# Patient Record
Sex: Male | Born: 1975
Health system: Southern US, Community
[De-identification: ages and names within clinical notes are randomized; demographics above are authoritative.]

## PROBLEM LIST (undated history)

## (undated) ENCOUNTER — Emergency Department: Discharge status: Absent without leave | Payer: BC Managed Care – PPO

## (undated) DIAGNOSIS — E669 Obesity, unspecified: Secondary | ICD-10-CM

## (undated) DIAGNOSIS — R7303 Prediabetes: Secondary | ICD-10-CM

## (undated) DIAGNOSIS — R51 Headache: Secondary | ICD-10-CM

## (undated) DIAGNOSIS — Z8619 Personal history of other infectious and parasitic diseases: Secondary | ICD-10-CM

## (undated) DIAGNOSIS — E119 Type 2 diabetes mellitus without complications: Secondary | ICD-10-CM

## (undated) DIAGNOSIS — R519 Headache, unspecified: Secondary | ICD-10-CM

## (undated) DIAGNOSIS — Z86018 Personal history of other benign neoplasm: Secondary | ICD-10-CM

## (undated) DIAGNOSIS — I10 Essential (primary) hypertension: Secondary | ICD-10-CM

## (undated) DIAGNOSIS — T884XXA Failed or difficult intubation, initial encounter: Secondary | ICD-10-CM

## (undated) DIAGNOSIS — Z87442 Personal history of urinary calculi: Secondary | ICD-10-CM

## (undated) DIAGNOSIS — E042 Nontoxic multinodular goiter: Secondary | ICD-10-CM

## (undated) DIAGNOSIS — Z9889 Other specified postprocedural states: Secondary | ICD-10-CM

## (undated) HISTORY — DX: Other specified postprocedural states: Z98.890

## (undated) HISTORY — DX: Personal history of other infectious and parasitic diseases: Z86.19

## (undated) HISTORY — PX: HERNIA REPAIR: SHX51

## (undated) HISTORY — DX: Prediabetes: R73.03

## (undated) HISTORY — DX: Obesity, unspecified: E66.9

## (undated) HISTORY — DX: Nontoxic multinodular goiter: E04.2

## (undated) HISTORY — DX: Personal history of other benign neoplasm: Z86.018

## (undated) HISTORY — DX: Type 2 diabetes mellitus without complications: E11.9

---
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
## 2003-10-22 DIAGNOSIS — Z86018 Personal history of other benign neoplasm: Secondary | ICD-10-CM

## 2003-10-22 DIAGNOSIS — Z9889 Other specified postprocedural states: Secondary | ICD-10-CM

## 2003-10-22 HISTORY — DX: Other specified postprocedural states: Z86.018

## 2003-10-22 HISTORY — DX: Other specified postprocedural states: Z98.890

## 2003-10-22 HISTORY — PX: THYROIDECTOMY, PARTIAL: SHX18

## 2004-08-09 ENCOUNTER — Ambulatory Visit: Payer: Self-pay | Admitting: Endocrinology

## 2005-06-03 ENCOUNTER — Emergency Department: Payer: Self-pay | Admitting: Emergency Medicine

## 2006-02-17 LAB — COMPREHENSIVE METABOLIC PANEL
ALT: 49 U/L — AB (ref 10–40)
AST: 23 U/L
Albumin: 4.2
Alkaline Phosphatase: 74 U/L
BUN: 14 mg/dL (ref 4–21)
Creat: 0.9
Sodium: 140 mmol/L (ref 137–147)
Total Bilirubin: 0.3 mg/dL

## 2006-02-17 LAB — LIPID PANEL
Cholesterol, Total: 178
Direct LDL: 122
HDL: 30 mg/dL — AB (ref 35–70)

## 2006-02-17 LAB — CBC
Hemoglobin: 15.1 g/dL (ref 13.5–17.5)
WBC: 9.2

## 2006-02-17 LAB — URIC ACID: Uric Acid: 8.2

## 2006-02-17 LAB — THYROID PANEL: TSH: 2.97 u[IU]/mL (ref 0.41–5.90)

## 2011-12-26 ENCOUNTER — Encounter: Payer: Self-pay | Admitting: Family Medicine

## 2012-01-01 ENCOUNTER — Encounter: Payer: Self-pay | Admitting: Family Medicine

## 2012-01-01 ENCOUNTER — Ambulatory Visit (INDEPENDENT_AMBULATORY_CARE_PROVIDER_SITE_OTHER): Payer: 59 | Admitting: Family Medicine

## 2012-01-01 VITALS — BP 128/90 | HR 76 | Temp 98.0°F | Ht 69.5 in | Wt 272.8 lb

## 2012-01-01 DIAGNOSIS — Z23 Encounter for immunization: Secondary | ICD-10-CM

## 2012-01-01 DIAGNOSIS — R635 Abnormal weight gain: Secondary | ICD-10-CM

## 2012-01-01 DIAGNOSIS — E669 Obesity, unspecified: Secondary | ICD-10-CM

## 2012-01-01 DIAGNOSIS — Z86018 Personal history of other benign neoplasm: Secondary | ICD-10-CM | POA: Insufficient documentation

## 2012-01-01 DIAGNOSIS — Z9889 Other specified postprocedural states: Secondary | ICD-10-CM

## 2012-01-01 NOTE — Assessment & Plan Note (Addendum)
Continued R sided neck discomfort (s/p R thyroidectomy) - on exam neck normal today. With unexpected weight gain recently - start with blood work to check thyroid function. Likely recommend return to ENT for further eval although pt hesitant. Consider thyroid ultrasound vs radioisotope scan depending on results of labwork. TPO Ab normal as well as AntiThyroglobulin Ab 2007.

## 2012-01-01 NOTE — Patient Instructions (Signed)
Good to meet you today, call us with questions. Tdap today. Return tomorrow fasting for blood work - we will check thyroid then. Return as needed or in 1 year for physical.

## 2012-01-01 NOTE — Assessment & Plan Note (Signed)
Stays active in gym.  Significant weight gain recently. Body mass index is 39.70 kg/(m^2). Check TSH given h/o thyroidectomy.

## 2012-01-01 NOTE — Progress Notes (Signed)
Subjective:    Patient ID: Jorge Boyle, male    DOB: [birth year], 36 y.o.   MRN: [id number]  HPI CC: new pt, establish  H/o R lobe thyroid adenoma s/p R hemithyroidectomy 2005.  Saw Dr. Patrecia Pace 2007 with ?recurrence/scarring causing dysphagia.  rec f/u with ENT Dr. Chestine Spore, did not f/u.  Last saw Dr. Patrecia Pace 2007.  Endorsing 1 month h/o neck bothering him - endorses swelling and tender mainly on right side (side of surgery).  No fevers/chills.  No skin or hair changes.  No heat or cold intolerance.  No diarrhea/constipation.  Denies dysphagia.  Gained 30 lbs in last month.  No significant changes in eating habits or in activity (continues to go to gym).  Preventative: No physical in past. Flu - did not receive Tetanus - unsure.  Requests Tdap. Last CPE - thinks 1 yr ago.  Ate toast this morning.  Will return tomorrow fasting for blood work.  Medications and allergies reviewed and updated in chart.  Past histories reviewed and updated if relevant as below. There is no problem list on file for this patient.  Past Medical History  Diagnosis Date  . Nontoxic nodular goiter   . HLD (hyperlipidemia)   . Obesity   . Allergic rhinitis   . Hyperglycemia   . Dysphagia     after thyroidectomy - pt denies  . History of chicken pox    Past Surgical History  Procedure Date  . Thyroidectomy, partial 2005    Right and isthmus  . Tonsillectomy and adenoidectomy 1985   History  Substance Use Topics  . Smoking status: Never Smoker   . Smokeless tobacco: Never Used  . Alcohol Use: No     rare   Family History  Problem Relation Age of Onset  . Diabetes Mother   . Diabetes Father   . Stroke Maternal Grandmother   . Diabetes Maternal Grandfather   . Diabetes Paternal Grandmother   . Cancer Paternal Grandmother 79    breast  . Coronary artery disease Neg Hx   . Cancer Other     stomach, lung (smoker)   Allergies  Allergen Reactions  . Penicillins Rash   No current  outpatient prescriptions on file prior to visit.     Review of Systems  Constitutional: Positive for appetite change (increased) and unexpected weight change (incresased 30lbs in last 1 mo). Negative for fever, chills, activity change and fatigue.  HENT: Negative for hearing loss and neck pain.   Eyes: Negative for visual disturbance.  Respiratory: Negative for cough, chest tightness, shortness of breath and wheezing.   Cardiovascular: Negative for chest pain, palpitations and leg swelling.  Gastrointestinal: Negative for nausea, vomiting, abdominal pain, diarrhea, constipation, blood in stool and abdominal distention.  Genitourinary: Negative for hematuria and difficulty urinating.  Musculoskeletal: Negative for myalgias and arthralgias.  Skin: Negative for rash.  Neurological: Negative for dizziness, seizures, syncope and headaches.  Hematological: Does not bruise/bleed easily.  Psychiatric/Behavioral: Negative for dysphoric mood. The patient is not nervous/anxious.        Objective:   Physical Exam  Nursing note and vitals reviewed. Constitutional: He is oriented to person, place, and time. He appears well-developed and well-nourished. No distress.       Obese Body mass index is 39.70 kg/(m^2).  HENT:  Head: Normocephalic and atraumatic.  Right Ear: External ear normal.  Left Ear: External ear normal.  Nose: Nose normal.  Mouth/Throat: Oropharynx is clear and moist. No oropharyngeal exudate.  Eyes: Conjunctivae  and EOM are normal. Pupils are equal, round, and reactive to light. No scleral icterus.  Neck: Normal range of motion. Neck supple. No JVD present. No thyromegaly present.       No evident thyroid enlargement or obvious nodules No scarring present  Cardiovascular: Normal rate, regular rhythm, normal heart sounds and intact distal pulses.   No murmur heard. Pulses:      Radial pulses are 2+ on the right side, and 2+ on the left side.  Pulmonary/Chest: Effort normal and  breath sounds normal. No respiratory distress. He has no wheezes. He has no rales.  Abdominal: Soft. Bowel sounds are normal. He exhibits no distension and no mass. There is no tenderness. There is no rebound and no guarding.  Musculoskeletal: Normal range of motion. He exhibits no edema.  Lymphadenopathy:    He has no cervical adenopathy.  Neurological: He is alert and oriented to person, place, and time.       CN grossly intact, station and gait intact  Skin: Skin is warm and dry. No rash noted.  Psychiatric: He has a normal mood and affect. His behavior is normal. Judgment and thought content normal.      Assessment & Plan:

## 2012-01-02 ENCOUNTER — Other Ambulatory Visit (INDEPENDENT_AMBULATORY_CARE_PROVIDER_SITE_OTHER): Payer: 59

## 2012-01-02 ENCOUNTER — Other Ambulatory Visit: Payer: Self-pay | Admitting: Family Medicine

## 2012-01-02 DIAGNOSIS — E041 Nontoxic single thyroid nodule: Secondary | ICD-10-CM

## 2012-01-02 DIAGNOSIS — E669 Obesity, unspecified: Secondary | ICD-10-CM

## 2012-01-02 DIAGNOSIS — Z9889 Other specified postprocedural states: Secondary | ICD-10-CM

## 2012-01-02 DIAGNOSIS — Z86018 Personal history of other benign neoplasm: Secondary | ICD-10-CM

## 2012-01-02 LAB — CBC WITH DIFFERENTIAL/PLATELET
Basophils Relative: 0.3 % (ref 0.0–3.0)
Eosinophils Absolute: 0.2 10*3/uL (ref 0.0–0.7)
Eosinophils Relative: 1.9 % (ref 0.0–5.0)
HCT: 42.8 % (ref 39.0–52.0)
Hemoglobin: 14.4 g/dL (ref 13.0–17.0)
Lymphs Abs: 2.6 10*3/uL (ref 0.7–4.0)
MCHC: 33.7 g/dL (ref 30.0–36.0)
MCV: 91.7 fl (ref 78.0–100.0)
Monocytes Absolute: 0.6 10*3/uL (ref 0.1–1.0)
Neutro Abs: 6.6 10*3/uL (ref 1.4–7.7)
RBC: 4.67 Mil/uL (ref 4.22–5.81)

## 2012-01-02 LAB — COMPREHENSIVE METABOLIC PANEL
ALT: 37 U/L (ref 0–53)
AST: 25 U/L (ref 0–37)
Alkaline Phosphatase: 53 U/L (ref 39–117)
Calcium: 8.8 mg/dL (ref 8.4–10.5)
Chloride: 103 mEq/L (ref 96–112)
Creatinine, Ser: 0.9 mg/dL (ref 0.4–1.5)
Total Bilirubin: 0.4 mg/dL (ref 0.3–1.2)

## 2012-01-02 LAB — LIPID PANEL
LDL Cholesterol: 131 mg/dL — ABNORMAL HIGH (ref 0–99)
Total CHOL/HDL Ratio: 5
Triglycerides: 50 mg/dL (ref 0.0–149.0)
VLDL: 10 mg/dL (ref 0.0–40.0)

## 2012-01-09 ENCOUNTER — Ambulatory Visit
Admission: RE | Admit: 2012-01-09 | Discharge: 2012-01-09 | Disposition: A | Discharge status: Home / Self Care | Payer: 59 | Source: Ambulatory Visit | Attending: Family Medicine | Admitting: Family Medicine

## 2012-01-09 DIAGNOSIS — E041 Nontoxic single thyroid nodule: Secondary | ICD-10-CM

## 2012-01-13 ENCOUNTER — Other Ambulatory Visit: Payer: Self-pay | Admitting: Family Medicine

## 2012-01-13 DIAGNOSIS — E041 Nontoxic single thyroid nodule: Secondary | ICD-10-CM

## 2012-01-14 ENCOUNTER — Other Ambulatory Visit: Payer: Self-pay | Admitting: Family Medicine

## 2012-01-14 DIAGNOSIS — E041 Nontoxic single thyroid nodule: Secondary | ICD-10-CM | POA: Insufficient documentation

## 2012-01-20 ENCOUNTER — Telehealth: Payer: Self-pay | Admitting: Family Medicine

## 2012-01-20 DIAGNOSIS — E042 Nontoxic multinodular goiter: Secondary | ICD-10-CM

## 2012-01-20 HISTORY — DX: Nontoxic multinodular goiter: E04.2

## 2012-01-20 NOTE — Telephone Encounter (Signed)
Called patient after mailing him the $ of the fine Needle Thyroid Biopsy. The letter was returned here as undeliverable  even though it was the correct address. Gave patient the phone number tp [facility] billing dept so he can call to see what they will accept from him as he is already making a monthly payment and the Biopsy cost about $2200.00. Says he will call me back after talking to them of whether or not he can afford to have this done now or not.

## 2012-01-20 NOTE — Telephone Encounter (Signed)
Noted. Thanks.

## 2012-02-18 ENCOUNTER — Other Ambulatory Visit (HOSPITAL_COMMUNITY)
Admission: RE | Admit: 2012-02-18 | Discharge: 2012-02-18 | Disposition: A | Discharge status: Home / Self Care | Payer: 59 | Source: Ambulatory Visit | Attending: Interventional Radiology | Admitting: Interventional Radiology

## 2012-02-18 ENCOUNTER — Ambulatory Visit
Admission: RE | Admit: 2012-02-18 | Discharge: 2012-02-18 | Disposition: A | Discharge status: Home / Self Care | Payer: 59 | Source: Ambulatory Visit | Attending: Family Medicine | Admitting: Family Medicine

## 2012-02-18 DIAGNOSIS — E049 Nontoxic goiter, unspecified: Secondary | ICD-10-CM | POA: Insufficient documentation

## 2012-02-18 DIAGNOSIS — E041 Nontoxic single thyroid nodule: Secondary | ICD-10-CM

## 2012-02-19 ENCOUNTER — Encounter: Payer: Self-pay | Admitting: Family Medicine

## 2012-07-30 ENCOUNTER — Encounter: Payer: Self-pay | Admitting: Family Medicine

## 2012-07-30 ENCOUNTER — Telehealth: Payer: Self-pay | Admitting: Family Medicine

## 2012-07-30 DIAGNOSIS — R7303 Prediabetes: Secondary | ICD-10-CM

## 2012-07-30 DIAGNOSIS — E1140 Type 2 diabetes mellitus with diabetic neuropathy, unspecified: Secondary | ICD-10-CM | POA: Insufficient documentation

## 2012-07-30 NOTE — Telephone Encounter (Signed)
plz notify I received blood work from [facility] - god chol levels, blood pressure.  A1c showing prediabetes which along with fam hx puts him at increased risk of developing diabetes.  Work on minimizing added sugars and weight loss. plz input into chart.  placed in kim's chart

## 2012-07-31 NOTE — Telephone Encounter (Signed)
Labs entered and sent for scanning.

## 2012-07-31 NOTE — Telephone Encounter (Signed)
Message left notifying patient.

## 2013-01-29 ENCOUNTER — Encounter: Payer: Self-pay | Admitting: Family Medicine

## 2013-01-29 ENCOUNTER — Ambulatory Visit (INDEPENDENT_AMBULATORY_CARE_PROVIDER_SITE_OTHER): Payer: 59 | Admitting: Family Medicine

## 2013-01-29 VITALS — BP 122/82 | HR 86 | Temp 98.1°F | Wt 267.8 lb

## 2013-01-29 DIAGNOSIS — J3489 Other specified disorders of nose and nasal sinuses: Secondary | ICD-10-CM

## 2013-01-29 DIAGNOSIS — R7309 Other abnormal glucose: Secondary | ICD-10-CM

## 2013-01-29 DIAGNOSIS — E669 Obesity, unspecified: Secondary | ICD-10-CM

## 2013-01-29 DIAGNOSIS — R7303 Prediabetes: Secondary | ICD-10-CM

## 2013-01-29 DIAGNOSIS — R42 Dizziness and giddiness: Secondary | ICD-10-CM

## 2013-01-29 DIAGNOSIS — E041 Nontoxic single thyroid nodule: Secondary | ICD-10-CM

## 2013-01-29 DIAGNOSIS — R0981 Nasal congestion: Secondary | ICD-10-CM | POA: Insufficient documentation

## 2013-01-29 LAB — CBC WITH DIFFERENTIAL/PLATELET
Basophils Relative: 0.4 % (ref 0.0–3.0)
Eosinophils Relative: 0.8 % (ref 0.0–5.0)
HCT: 43.3 % (ref 39.0–52.0)
Lymphs Abs: 1.7 10*3/uL (ref 0.7–4.0)
MCV: 89.7 fl (ref 78.0–100.0)
Monocytes Absolute: 0.6 10*3/uL (ref 0.1–1.0)
Monocytes Relative: 6.8 % (ref 3.0–12.0)
Neutrophils Relative %: 72.3 % (ref 43.0–77.0)
RBC: 4.83 Mil/uL (ref 4.22–5.81)
WBC: 8.8 10*3/uL (ref 4.5–10.5)

## 2013-01-29 LAB — BASIC METABOLIC PANEL
CO2: 20 mEq/L (ref 19–32)
Glucose, Bld: 107 mg/dL — ABNORMAL HIGH (ref 70–99)
Potassium: 3.9 mEq/L (ref 3.5–5.1)
Sodium: 137 mEq/L (ref 135–145)

## 2013-01-29 LAB — T3: T3, Total: 70.8 ng/dL — ABNORMAL LOW (ref 80.0–204.0)

## 2013-01-29 MED ORDER — FLUTICASONE PROPIONATE 50 MCG/ACT NA SUSP: 2.0000 | Freq: Every day | NASAL | Status: DC

## 2013-01-29 NOTE — Assessment & Plan Note (Signed)
Doubt infectious, rather allergic. See pt instructions for plan. Update if sxs persist or worsen, to consider abx course.

## 2013-01-29 NOTE — Assessment & Plan Note (Signed)
Recheck A1c today 

## 2013-01-29 NOTE — Assessment & Plan Note (Signed)
Check blood work today. Due for recheck thyroid US - will schedule.

## 2013-01-29 NOTE — Assessment & Plan Note (Signed)
Encouraged continued exercise for weight loss.

## 2013-01-29 NOTE — Patient Instructions (Addendum)
Blood work today. Pass by Marion's office for referral for thyroid ultrasound to monitor thyroid nodules. I think you do have significant sinus congestion, but I don't think there's infection. Treat with starting nasal saline irrigation, nasal steroid (sent to pharmacy), and plain mucinex or immediate release guaifenesin with plenty of water to mobilize mucous out. Watch for fever, worsening headaches or if not improving with above, let me know. Good to see you today, call us with questions. Pass by [facility]'s office for thyroid ultrasound.

## 2013-01-29 NOTE — Progress Notes (Signed)
  Subjective:    Patient ID: Jorge Boyle, male    DOB: 10/16/1976, 37 y.o.   MRN: [id number]  HPI CC: lightheadedness  1.5 wk h/o sinus congestion (intermittent long term, recently worsening).  Also notices lightheadedness worse over last week.  Wonders about sugars.  Seems to improve after drinking soda.  + posterior skull headache daily over last week.  Some sinus and facial pressure/pain.  Rhinorrhea.  Blowing nose with yellow mucous.  No fevers/chills, abd pain, nausea/vomiting, diarrhea, ear or tooth pain, PNdrainage, sore throat.  Denies chest pain, wheezing, dyspnea.  No increased frequency.  No cough. Could do better with hydration status.  H/o allergic rhinitis (pt denies).  No h/o asthma. No sick contacts at home. No smokers at home.  Checked sugar at mother's house when wasn't feeling well, cbg was 70.  Umbilical hernia for last 2 months.  Not tender.  Started after picked up motor at [place].  H/o R lobe thyroid adenoma s/p R hemithyroidectomy 2005. Saw Dr. Patrecia Pace 2007 with ?recurrence/scarring causing dysphagia. rec f/u with ENT Dr. Chestine Spore, did not f/u. Last saw Dr. Patrecia Pace 2007. Had thyroid ultrasound showing mult nodules 12/2011.  S/p benign thyroid biopsy 12/2011. Due for blood work.  Wt Readings from Last 3 Encounters:  01/29/13 267 lb 12 oz (121.451 kg)  01/01/12 272 lb 12 oz (123.719 kg)    Past Medical History  Diagnosis Date  . Nontoxic nodular goiter   . Obesity   . Allergic rhinitis   . Prediabetes   . Dysphagia     after thyroidectomy - pt did not f/u with ENT  . History of chicken pox   . S/P excision of thyroid adenoma 2005    R side  . Multiple thyroid nodules 01/2012    left, biopsy x2 benign, rec rpt [place] 1 yr     Review of Systems Per HPI    Objective:   Physical Exam  Nursing note and vitals reviewed. Constitutional: He appears well-developed and well-nourished. No distress.  HENT:  Head: Normocephalic and atraumatic.  Right Ear:  Hearing, tympanic membrane, external ear and ear canal normal.  Left Ear: Hearing, tympanic membrane, external ear and ear canal normal.  Nose: Mucosal edema present. No rhinorrhea. Right sinus exhibits no maxillary sinus tenderness and no frontal sinus tenderness. Left sinus exhibits no maxillary sinus tenderness and no frontal sinus tenderness.  Mouth/Throat: Uvula is midline, oropharynx is clear and moist and mucous membranes are normal. No oropharyngeal exudate, posterior oropharyngeal edema, posterior oropharyngeal erythema or tonsillar abscesses.  Swollen nasal turbinates, discharge in nostrils  Eyes: Conjunctivae and EOM are normal. Pupils are equal, round, and reactive to light. No scleral icterus.  Neck: Normal range of motion. Neck supple. No thyromegaly present.  Cardiovascular: Normal rate, regular rhythm, normal heart sounds and intact distal pulses.   No murmur heard. Pulmonary/Chest: Effort normal and breath sounds normal. No respiratory distress. He has no wheezes. He has no rales.  Lymphadenopathy:    He has no cervical adenopathy.  Skin: Skin is warm and dry. No rash noted.       Assessment & Plan:

## 2013-02-01 ENCOUNTER — Encounter: Payer: Self-pay | Admitting: *Deleted

## 2013-02-05 ENCOUNTER — Ambulatory Visit
Admission: RE | Admit: 2013-02-05 | Discharge: 2013-02-05 | Disposition: A | Discharge status: Home / Self Care | Payer: 59 | Source: Ambulatory Visit | Attending: Family Medicine | Admitting: Family Medicine

## 2013-02-05 ENCOUNTER — Encounter: Payer: Self-pay | Admitting: Family Medicine

## 2013-02-05 DIAGNOSIS — E041 Nontoxic single thyroid nodule: Secondary | ICD-10-CM

## 2013-02-08 ENCOUNTER — Encounter: Payer: Self-pay | Admitting: *Deleted

## 2013-05-21 LAB — LIPID PANEL
Direct LDL: 126
HDL: 35 mg/dL (ref 35–70)
Triglycerides: 119

## 2013-05-21 LAB — HEMOGLOBIN A1C: A1c: 6.5

## 2013-05-31 ENCOUNTER — Ambulatory Visit: Payer: 59 | Admitting: Family Medicine

## 2013-06-02 ENCOUNTER — Ambulatory Visit (INDEPENDENT_AMBULATORY_CARE_PROVIDER_SITE_OTHER): Payer: 59 | Admitting: Family Medicine

## 2013-06-02 ENCOUNTER — Encounter: Payer: Self-pay | Admitting: *Deleted

## 2013-06-02 ENCOUNTER — Encounter: Payer: Self-pay | Admitting: Family Medicine

## 2013-06-02 VITALS — BP 118/78 | HR 68 | Temp 98.0°F | Wt 275.5 lb

## 2013-06-02 DIAGNOSIS — K429 Umbilical hernia without obstruction or gangrene: Secondary | ICD-10-CM

## 2013-06-02 NOTE — Patient Instructions (Signed)
you have umbilical hernia.  Best thing to do is work on weight.  Increase cardio. Pass by [facility]'s office for referral to surgeon to discuss.  Hernia A hernia occurs when an internal organ pushes out through a weak spot in the abdominal wall. Hernias most commonly occur in the groin and around the navel. Hernias often can be pushed back into place (reduced). Most hernias tend to get worse over time. Some abdominal hernias can get stuck in the opening (irreducible or incarcerated hernia) and cannot be reduced. An irreducible abdominal hernia which is tightly squeezed into the opening is at risk for impaired blood supply (strangulated hernia). A strangulated hernia is a medical emergency. Because of the risk for an irreducible or strangulated hernia, surgery may be recommended to repair a hernia. CAUSES   Heavy lifting.  Prolonged coughing.  Straining to have a bowel movement.  A cut (incision) made during an abdominal surgery. HOME CARE INSTRUCTIONS   Bed rest is not required. You may continue your normal activities.  Avoid lifting more than 10 pounds (4.5 kg) or straining.  Cough gently. If you are a smoker it is best to stop. Even the best hernia repair can break down with the continual strain of coughing. Even if you do not have your hernia repaired, a cough will continue to aggravate the problem.  Do not wear anything tight over your hernia. Do not try to keep it in with an outside bandage or truss. These can damage abdominal contents if they are trapped within the hernia sac.  Eat a normal diet.  Avoid constipation. Straining over long periods of time will increase hernia size and encourage breakdown of repairs. If you cannot do this with diet alone, stool softeners may be used. SEEK IMMEDIATE MEDICAL CARE IF:   You have a fever.  You develop increasing abdominal pain.  You feel nauseous or vomit.  Your hernia is stuck outside the abdomen, looks discolored, feels hard, or is  tender.  You have any changes in your bowel habits or in the hernia that are unusual for you.  You have increased pain or swelling around the hernia.  You cannot push the hernia back in place by applying gentle pressure while lying down. MAKE SURE YOU:   Understand these instructions.  Will watch your condition.  Will get help right away if you are not doing well or get worse. Document Released: 10/07/2005 Document Revised: 12/30/2011 Document Reviewed: 05/26/2008 [facility] Patient Information 2014 [place], [place].

## 2013-06-02 NOTE — Progress Notes (Signed)
  Subjective:    Patient ID: Jorge Boyle, male    DOB: February 17, 1976, 36 y.o.   MRN: [id number]  HPI CC: abd discomfort  Lower abd discomfort - noted for last 2 weeks.  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  Last 2 days, abd discomfort has improved.  No fevers/chills, nausea/vomiting, diarrhea, constipation.  Pain not food related.  No gassiness or bloating or indigestion.  No h/o abd surgeries in past.  Body mass index is 40.11 kg/(m^2). Wt Readings from Last 3 Encounters:  06/02/13 275 lb 8 oz (124.966 kg)  01/29/13 267 lb 12 oz (121.451 kg)  01/01/12 272 lb 12 oz (123.719 kg)   Past Medical History  Diagnosis Date  . Nontoxic nodular goiter   . Obesity   . Allergic rhinitis   . Prediabetes   . History of chicken pox   . S/P excision of thyroid adenoma 2005    R side  . Multiple thyroid nodules 01/2012    left, biopsy x2 benign, rpt [place] stable (01/2013), consider rpt 1-2 yrs    Past Surgical History  Procedure Laterality Date  . Thyroidectomy, partial  2005    Right and isthmus  . Tonsillectomy and adenoidectomy  1985   Review of Systems Per HPI    Objective:   Physical Exam  Nursing note and vitals reviewed. Constitutional: He appears well-developed and well-nourished. No distress.  Abdominal: Soft. Normal appearance and bowel sounds are normal. He exhibits no distension and no mass. There is no tenderness. There is no rebound and no guarding. A hernia is present.  Small umbilical hernia present, reducible and nontender. About 3 cm diameter defect in abd wall       Assessment & Plan:

## 2013-06-04 ENCOUNTER — Emergency Department: Payer: Self-pay | Admitting: Emergency Medicine

## 2013-06-23 ENCOUNTER — Ambulatory Visit: Payer: Self-pay | Admitting: General Surgery

## 2013-06-30 ENCOUNTER — Encounter: Payer: Self-pay | Admitting: General Surgery

## 2013-06-30 ENCOUNTER — Ambulatory Visit (INDEPENDENT_AMBULATORY_CARE_PROVIDER_SITE_OTHER): Payer: 59 | Admitting: General Surgery

## 2013-06-30 VITALS — BP 164/82 | HR 80 | Resp 14 | Ht 70.0 in | Wt 274.0 lb

## 2013-06-30 DIAGNOSIS — K429 Umbilical hernia without obstruction or gangrene: Secondary | ICD-10-CM

## 2013-06-30 NOTE — Progress Notes (Signed)
Patient ID: Jorge Boyle, male   DOB: 1975/11/15, 37 y.o.   MRN: [id number]  Chief Complaint  Patient presents with  . Other    evaluation of an umbilical hernia    HPI Jorge Boyle is a 37 y.o. male here today for an evaluation of an umbilical hernia. The patient states he noticed the hernia approximately 2 months ago. He states he has abdominal pain with lifting. No other complaints at this time.  The patient is accompanied today by his father who was present for the interview and exam. The father reports that he has checked his son sugars on several occasions and found values in the 300s. Recently a fasting morning blood sugar was over 200.  The abdominal pain reported is discomfort at the umbilical area when the patient bends at the waist. No nausea, vomiting or other GI distress. No change in bowel habits. No unusual physical activity recall prior to the onset of the hernia  HPI  Past Medical History  Diagnosis Date  . Nontoxic nodular goiter   . Obesity   . Allergic rhinitis   . Prediabetes   . History of chicken pox   . S/P excision of thyroid adenoma 2005    R side  . Multiple thyroid nodules 01/2012    left, biopsy x2 benign, rpt [place] stable (01/2013), consider rpt 1-2 yrs    Past Surgical History  Procedure Laterality Date  . Thyroidectomy, partial  2005    Right and isthmus  . Tonsillectomy and adenoidectomy  1985    Family History  Problem Relation Age of Onset  . Diabetes Mother   . Diabetes Father   . Stroke Maternal Grandmother   . Diabetes Maternal Grandfather   . Diabetes Paternal Grandmother   . Cancer Paternal Grandmother 28    breast  . Coronary artery disease Neg Hx   . Cancer Other     stomach, lung (smoker)    Social History History  Substance Use Topics  . Smoking status: Never Smoker   . Smokeless tobacco: Never Used  . Alcohol Use: Yes     Comment: rare    Allergies  Allergen Reactions  . Penicillins Rash    Current Outpatient  Prescriptions  Medication Sig Dispense Refill  . fluticasone (FLONASE) 50 MCG/ACT nasal spray Place 2 sprays into the nose daily.  16 g  3   No current facility-administered medications for this visit.    Review of Systems Review of Systems  Constitutional: Negative.   Respiratory: Negative.   Cardiovascular: Negative.   Gastrointestinal: Positive for abdominal pain.   GU: Polyuria.  Blood pressure 164/82, pulse 80, resp. rate 14, height 5\' 10"  (1.778 m), weight 274 lb (124.286 kg).  Physical Exam Physical Exam  Constitutional: He is oriented to person, place, and time. He appears well-developed and well-nourished.  Cardiovascular: Normal rate, regular rhythm and normal heart sounds.   Pulmonary/Chest: Breath sounds normal.  Abdominal: Normal appearance and bowel sounds are normal. Hernia: umbilical hernia.  The umbilical hernia is perhaps 3 cm in maximum diameter. Nonreducible fat. No air edema. Minimal local tenderness. No inguinal hernias are identified.  Lymphadenopathy:    He has no cervical adenopathy.  Neurological: He is alert and oriented to person, place, and time.  Skin: Skin is warm and dry.    Data Reviewed 01/29/2013 hemoglobin A1c determination: 6.0.  Assessment    Umbilical hernia with nonreducible fat.  History of elevated blood sugar and polyuria.    Plan  The patient has been encouraged to followup with his primary care physician for reassessment of his glucose control. Elective surgery should be deferred until blood sugars are regularly below 200, ideally below 150. Nothing on clinical exam prompt surgical intervention with his present glucose history.       Earline Mayotte 06/30/2013, 4:14 PM

## 2013-07-06 ENCOUNTER — Ambulatory Visit (INDEPENDENT_AMBULATORY_CARE_PROVIDER_SITE_OTHER): Payer: 59 | Admitting: Family Medicine

## 2013-07-06 ENCOUNTER — Encounter: Payer: Self-pay | Admitting: Family Medicine

## 2013-07-06 VITALS — BP 128/88 | HR 84 | Temp 97.4°F | Wt 272.0 lb

## 2013-07-06 DIAGNOSIS — R7303 Prediabetes: Secondary | ICD-10-CM

## 2013-07-06 DIAGNOSIS — E669 Obesity, unspecified: Secondary | ICD-10-CM

## 2013-07-06 DIAGNOSIS — Z23 Encounter for immunization: Secondary | ICD-10-CM

## 2013-07-06 DIAGNOSIS — R7309 Other abnormal glucose: Secondary | ICD-10-CM

## 2013-07-06 DIAGNOSIS — R030 Elevated blood-pressure reading, without diagnosis of hypertension: Secondary | ICD-10-CM

## 2013-07-06 DIAGNOSIS — IMO0001 Reserved for inherently not codable concepts without codable children: Secondary | ICD-10-CM

## 2013-07-06 MED ORDER — METFORMIN HCL 500 MG PO TABS: 500.0000 mg | ORAL_TABLET | Freq: Every day | ORAL | Status: DC

## 2013-07-06 NOTE — Patient Instructions (Addendum)
Flu shot today. Let's keep track of blood pressure once a week or once every 2 weeks at parent's house or local pharmacy.  Goal blood pressure <140/90.  If consistently elevated, let me know. Avoid simple carbs (white starches.  When you eat carbs, eat complex carbs.  Increase vegetables and some fruits.  Avoid sweetened beverages. Let's start metformin 500mg  once daily at night time with dinner for better sugar control.

## 2013-07-06 NOTE — Assessment & Plan Note (Signed)
Wt Readings from Last 3 Encounters:  07/06/13 272 lb (123.378 kg)  06/30/13 274 lb (124.286 kg)  06/02/13 275 lb 8 oz (124.966 kg)  weight slowly dropping. Encouraged continued weight loss.

## 2013-07-06 NOTE — Addendum Note (Signed)
Addended by: Janalyn Harder on: 07/06/2013 10:51 AM   Modules accepted: Orders

## 2013-07-06 NOTE — Assessment & Plan Note (Signed)
Recommended mediterranean diet. Pt will monitor at home ,and notify me if persistently elevated.

## 2013-07-06 NOTE — Assessment & Plan Note (Addendum)
Known prediabetes, latest A1c crept up to 6.5%.   Start metformin.   Discussed dietary changes including avoiding simple carbs.

## 2013-07-06 NOTE — Progress Notes (Signed)
  Subjective:    Patient ID: Jorge Boyle, male    DOB: [birth year], 37 y.o.   MRN: [id number]  HPI CC: discuss concerns  Recent eval by surgery - endorsed high sugars to 300, however on checks here over the last year never >150.  Over the last month, endorsing polyuria, nocturia several times a night.  Checked sugar once this week fasting - 210.  Today checked sugar fasting 112.  No polyphagia, polydipsia. Lab Results  Component Value Date   HGBA1C 6.0 01/29/2013   Known h/o prediabetes.  However last a1c at work was 6.5% - diabetes range.  Also found to have elevated BP reading to 164/82, first elevated reading.   BP Readings from Last 3 Encounters:  07/06/13 128/88  06/30/13 164/82  06/02/13 118/78   Lives with brother - doesn't like to buy food because brother will eat it.  Past Medical History  Diagnosis Date  . Nontoxic nodular goiter   . Obesity   . Allergic rhinitis   . Prediabetes   . History of chicken pox   . S/P excision of thyroid adenoma 2005    R side  . Multiple thyroid nodules 01/2012    left, biopsy x2 benign, rpt [place] stable (01/2013), consider rpt 1-2 yrs     Review of Systems Per HPI    Objective:   Physical Exam  Nursing note and vitals reviewed. Constitutional: He appears well-developed and well-nourished. No distress.  HENT:  Mouth/Throat: Oropharynx is clear and moist. No oropharyngeal exudate.  Cardiovascular: Normal rate, regular rhythm, normal heart sounds and intact distal pulses.   No murmur heard. Pulmonary/Chest: Effort normal and breath sounds normal. No respiratory distress. He has no wheezes. He has no rales.  Musculoskeletal: He exhibits no edema.  Skin: Skin is warm and dry.  Psychiatric: He has a normal mood and affect.       Assessment & Plan:

## 2013-07-07 ENCOUNTER — Encounter: Payer: Self-pay | Admitting: *Deleted

## 2013-08-05 ENCOUNTER — Encounter: Payer: Self-pay | Admitting: Family Medicine

## 2013-11-22 ENCOUNTER — Encounter: Payer: Self-pay | Admitting: Family Medicine

## 2013-11-22 ENCOUNTER — Ambulatory Visit (INDEPENDENT_AMBULATORY_CARE_PROVIDER_SITE_OTHER): Payer: 59 | Admitting: Family Medicine

## 2013-11-22 VITALS — BP 128/88 | HR 72 | Temp 98.2°F | Wt 278.5 lb

## 2013-11-22 DIAGNOSIS — R7303 Prediabetes: Secondary | ICD-10-CM

## 2013-11-22 DIAGNOSIS — E669 Obesity, unspecified: Secondary | ICD-10-CM

## 2013-11-22 DIAGNOSIS — R358 Other polyuria: Secondary | ICD-10-CM

## 2013-11-22 DIAGNOSIS — K429 Umbilical hernia without obstruction or gangrene: Secondary | ICD-10-CM

## 2013-11-22 DIAGNOSIS — R3589 Other polyuria: Secondary | ICD-10-CM

## 2013-11-22 DIAGNOSIS — R7309 Other abnormal glucose: Secondary | ICD-10-CM

## 2013-11-22 DIAGNOSIS — E041 Nontoxic single thyroid nodule: Secondary | ICD-10-CM

## 2013-11-22 DIAGNOSIS — E119 Type 2 diabetes mellitus without complications: Secondary | ICD-10-CM

## 2013-11-22 DIAGNOSIS — K439 Ventral hernia without obstruction or gangrene: Secondary | ICD-10-CM

## 2013-11-22 LAB — POCT URINALYSIS DIPSTICK
Bilirubin, UA: NEGATIVE
GLUCOSE UA: NEGATIVE
Ketones, UA: NEGATIVE
Leukocytes, UA: NEGATIVE
NITRITE UA: NEGATIVE
Protein, UA: NEGATIVE
SPEC GRAV UA: 1.015
UROBILINOGEN UA: 0.2
pH, UA: 7

## 2013-11-22 LAB — GLUCOSE, POCT (MANUAL RESULT ENTRY): POC GLUCOSE: 104 mg/dL — AB (ref 70–99)

## 2013-11-22 MED ORDER — ONETOUCH ULTRA SYSTEM W/DEVICE KIT: PACK | Status: DC

## 2013-11-22 MED ORDER — GLUCOSE BLOOD VI STRP
ORAL_STRIP | Status: DC
Start: 2013-11-22 — End: 2015-12-23

## 2013-11-22 NOTE — Assessment & Plan Note (Addendum)
Consider rpt [place] 01/2014 Will recheck thyroid function panel next month at f/u visit.

## 2013-11-22 NOTE — Patient Instructions (Signed)
You have persistent umbilical hernia, but also have a ventral hernia - this should not cause any problems. Weight loss will be the best treatment for both these hernias.  Try to incorporate gym into routine.  Try to prepare meals to take to work instead of eating out. Sugar check today. I've sent in glucose meter for you to have at home and check as needed. Let me know if you'd like referral back to general surgery.  Ventral Hernia A ventral hernia (also called an incisional hernia) is a hernia that occurs at the site of a previous surgical cut (incision) in the abdomen or midline in the abdomen. The abdominal wall spans from your lower chest down to your pelvis. If the abdominal wall is weakened from a surgical incision, a hernia can occur. A hernia is a bulge of bowel or muscle tissue pushing out on the weakened part of the abdominal wall. Ventral hernias can get bigger from straining or lifting. Obese and older people are at higher risk for a ventral hernia. People who develop infections after surgery or require repeat incisions at the same site on the abdomen are also at increased risk. CAUSES  A ventral hernia occurs because of weakness in the abdominal wall at an incision site.  SYMPTOMS  Common symptoms include:  A visible bulge or lump on the abdominal wall.  Pain or tenderness around the lump.  Increased discomfort if you cough or make a sudden movement. If the hernia has blocked part of the intestine, a serious complication can occur (incarcerated or strangulated hernia). This can become a problem that requires emergency surgery because the blood flow to the blocked intestine may be cut off. Symptoms may include:  Feeling sick to your stomach (nauseous).  Throwing up (vomiting).  Stomach swelling (distention) or bloating.  Fever.  Rapid heartbeat. DIAGNOSIS  Your caregiver will take a medical history and perform a physical exam. Various tests may be ordered, such as:  Blood  tests.  Urine tests.  Ultrasonography.  X-rays.  Computed tomography (CT). TREATMENT  Watchful waiting may be all that is needed for a smaller hernia that does not cause symptoms. Your caregiver may recommend the use of a supportive belt (truss) that helps to keep the abdominal wall intact. For larger hernias or those that cause pain, surgery to repair the hernia is usually recommended. If a hernia becomes strangulated, emergency surgery needs to be done right away. HOME CARE INSTRUCTIONS  Avoid putting pressure or strain on the abdominal area.  Avoid heavy lifting.  Use good body positioning for physical tasks. Ask your caregiver about proper body positioning.  Use a supportive belt as directed by your caregiver.  Maintain a healthy weight.  Eat foods that are high in fiber, such as whole grains, fruits, and vegetables. Fiber helps prevent difficult bowel movements (constipation).  Drink enough fluids to keep your urine clear or pale yellow.  Follow up with your caregiver as directed. SEEK MEDICAL CARE IF:   Your hernia seems to be getting larger or more painful. SEEK IMMEDIATE MEDICAL CARE IF:   You have abdominal pain that is sudden and sharp.  Your pain becomes severe.  You have repeated vomiting.  You are sweating a lot.  You notice a rapid heartbeat.  You develop a fever. MAKE SURE YOU:   Understand these instructions.  Will watch your condition.  Will get help right away if you are not doing well or get worse. Document Released: 09/23/2012 Document Reviewed: 09/23/2012  [employer] Patient Information 2014 [employer].  

## 2013-11-22 NOTE — Assessment & Plan Note (Addendum)
Overall stable. Offered referral back to surgery as pt remains worried about this.

## 2013-11-22 NOTE — Assessment & Plan Note (Signed)
Small. Doubt will cause a problem.

## 2013-11-22 NOTE — Assessment & Plan Note (Addendum)
H/o one elevated A1c to 6.5%, since then all well controlled on metformin and <6.5. Continue med. Script for glucose meter provided today. cbg today 104.

## 2013-11-22 NOTE — Assessment & Plan Note (Signed)
Reviewed importance of weight loss to help control hernias present. Discussed preparing meals to take to work, discussed healthy options for convenience food, discussed incorporating exercise into routine. Wt Readings from Last 3 Encounters:  11/22/13 278 lb 8 oz (126.327 kg)  07/06/13 272 lb (123.378 kg)  06/30/13 274 lb (124.286 kg)  Body mass index is 39.96 kg/(m^2).

## 2013-11-22 NOTE — Assessment & Plan Note (Signed)
No cause found on UA/micro today.

## 2013-11-22 NOTE — Progress Notes (Signed)
   Subjective:    Patient ID: Jorge Boyle, male    DOB: [birth year], 38 y.o.   MRN: [id number]  HPI CC: recheck hernia  Known umbilical hernia evaluated by Dr. Fleet Contras but at that time his sugars had been running high to 200-300.  A1c always normal here however.  Now over last week noticing swelling superior to prior umbilical hernia.  Persistent discomfort at umbilicus.  Always reducible.  Obesity - Body mass index is 39.96 kg/(m^2). Wt Readings from Last 3 Encounters:  11/22/13 278 lb 8 oz (126.327 kg)  07/06/13 272 lb (123.378 kg)  06/30/13 274 lb (124.286 kg)  works long hours, second shift, hasn't made time to incorporate exercise into routine. When he goes to gym, finds he is constantly voiding.  Also endorses increased urination during the day at work.  h/o mild diabetes - has been controlled with am metformin 500mg  with breakfast. Lab Results  Component Value Date   HGBA1C 6.0 01/29/2013   H/o thyroid nodule on left s/p R partial thyroidectomy. Lab Results  Component Value Date   TSH 2.95 01/29/2013   Past Medical History  Diagnosis Date  . Nontoxic nodular goiter   . Obesity   . Allergic rhinitis   . Prediabetes   . History of chicken pox   . S/P excision of thyroid adenoma 2005    R side  . Multiple thyroid nodules 01/2012    left, biopsy x2 benign, rpt [place] stable (01/2013), consider rpt 1-2 yrs    Past Surgical History  Procedure Laterality Date  . Thyroidectomy, partial  2005    Right and isthmus  . Tonsillectomy and adenoidectomy  1985    Review of Systems Per HPI    Objective:   Physical Exam  Nursing note and vitals reviewed. Constitutional: He appears well-developed and well-nourished. No distress.  HENT:  Mouth/Throat: Oropharynx is clear and moist. No oropharyngeal exudate.  Neck: No thyromegaly present.  Cardiovascular: Normal rate, regular rhythm, normal heart sounds and intact distal pulses.   No murmur heard. Pulmonary/Chest: Effort normal  and breath sounds normal. No respiratory distress. He has no wheezes. He has no rales.  Abdominal: Soft. Bowel sounds are normal. He exhibits no distension and no mass. There is no hepatosplenomegaly. There is no tenderness. There is no rigidity, no rebound, no guarding and negative Murphy's sign. A hernia is present. Hernia confirmed positive in the ventral area.  Obese Ventral and umbilical fat containing hernia present.  Reducible, slightly tender to palpation.  Musculoskeletal: He exhibits no edema.  Skin: Skin is warm and dry. No rash noted.  Psychiatric: He has a normal mood and affect.       Assessment & Plan:

## 2013-11-22 NOTE — Progress Notes (Signed)
Pre-visit discussion using our clinic review tool. No additional management support is needed unless otherwise documented below in the visit note.  

## 2013-11-25 ENCOUNTER — Telehealth: Payer: Self-pay

## 2013-11-25 NOTE — Telephone Encounter (Signed)
Relevant patient education assigned to patient using Emmi. ° °

## 2013-12-01 ENCOUNTER — Telehealth: Payer: Self-pay

## 2013-12-01 DIAGNOSIS — K429 Umbilical hernia without obstruction or gangrene: Secondary | ICD-10-CM

## 2013-12-01 DIAGNOSIS — K439 Ventral hernia without obstruction or gangrene: Secondary | ICD-10-CM

## 2013-12-01 NOTE — Telephone Encounter (Signed)
Pt left v/m requesting referral to surgeon; pt saw Dr Darnell Level 11/22/13 and pt has decided to see surgeon.Please advise.

## 2013-12-02 NOTE — Telephone Encounter (Signed)
Patient notified

## 2013-12-02 NOTE — Telephone Encounter (Signed)
Hernias stay uncomfortable.  Will refer back to surgery for recheck. plz notify it may be 1-2 wks before we call for appt but referral coordinator will get to it as soon as able.

## 2013-12-14 ENCOUNTER — Other Ambulatory Visit: Payer: Self-pay | Admitting: General Surgery

## 2013-12-14 ENCOUNTER — Ambulatory Visit (INDEPENDENT_AMBULATORY_CARE_PROVIDER_SITE_OTHER): Payer: 59 | Admitting: General Surgery

## 2013-12-14 ENCOUNTER — Encounter: Payer: Self-pay | Admitting: General Surgery

## 2013-12-14 VITALS — BP 168/86 | HR 84 | Resp 16 | Ht 70.0 in | Wt 281.0 lb

## 2013-12-14 DIAGNOSIS — K429 Umbilical hernia without obstruction or gangrene: Secondary | ICD-10-CM

## 2013-12-14 NOTE — Patient Instructions (Addendum)
Patient to be scheduled for umbilical hernia repair.    Hernia Repair with Laparoscope A hernia occurs when an internal organ pushes out through a weak spot in the belly (abdominal) wall muscles. Hernias most commonly occur in the groin and around the navel. Hernias can also occur through a cut by the surgeon (incision) after an abdominal operation. A hernia may be caused by:  Lifting heavy objects.  Prolonged coughing.  Straining to move your bowels. Hernias can often be pushed back into place (reduced). Most hernias tend to get worse over time. Problems occur when abdominal contents get stuck in the opening and the blood supply is blocked or impaired (incarcerated hernia). Because of these risks, you require surgery to repair the hernia. Your hernia will be repaired using a laparoscope. Laparoscopic surgery is a type of minimally invasive surgery. It does not involve making a typical surgical cut (incision) in the skin. A laparoscope is a telescope-like rod and lens system. It is usually connected to a video camera and a light source so your caregiver can clearly see the operative area. The instruments are inserted through  to  inch (5 mm or 10 mm) openings in the skin at specific locations. A working and viewing space is created by blowing a small amount of carbon dioxide gas into the abdominal cavity. The abdomen is essentially blown up like a balloon (insufflated). This elevates the abdominal wall above the internal organs like a dome. The carbon dioxide gas is common to the human body and can be absorbed by tissue and removed by the respiratory system. Once the repair is completed, the small incisions will be closed with either stitches (sutures) or staples (just like a paper stapler only this staple holds the skin together). LET YOUR CAREGIVERS KNOW ABOUT:  Allergies.  Medications taken including herbs, eye drops, over the counter medications, and creams.  Use of steroids (by mouth or  creams).  Previous problems with anesthetics or Novocaine.  Possibility of pregnancy, if this applies.  History of blood clots (thrombophlebitis).  History of bleeding or blood problems.  Previous surgery.  Other health problems. BEFORE THE PROCEDURE  Laparoscopy can be done either in a hospital or out-patient clinic. You may be given a mild sedative to help you relax before the procedure. Once in the operating room, you will be given a general anesthesia to make you sleep (unless you and your caregiver choose a different anesthetic).  AFTER THE PROCEDURE  After the procedure you will be watched in a recovery area. Depending on what type of hernia was repaired, you might be admitted to the hospital or you might go home the same day. With this procedure you may have less pain and scarring. This usually results in a quicker recovery and less risk of infection. HOME CARE INSTRUCTIONS   Bed rest is not required. You may continue your normal activities but avoid heavy lifting (more than 10 pounds) or straining.  Cough gently. If you are a smoker it is best to stop, as even the best hernia repair can break down with the continual strain of coughing.  Avoid driving until given the OK by your surgeon.  There are no dietary restrictions unless given otherwise.  TAKE ALL MEDICATIONS AS DIRECTED.  Only take over-the-counter or prescription medicines for pain, discomfort, or fever as directed by your caregiver. SEEK MEDICAL CARE IF:   There is increasing abdominal pain or pain in your incisions.  There is more bleeding from incisions, other than  minimal spotting.  You feel light headed or faint.  You develop an unexplained fever, chills, and/or an oral temperature above 102 F (38.9 C).  You have redness, swelling, or increasing pain in the wound.  Pus coming from wound.  A foul smell coming from the wound or dressings. SEEK IMMEDIATE MEDICAL CARE IF:   You develop a rash.  You  have difficulty breathing.  You have any allergic problems. MAKE SURE YOU:   Understand these instructions.  Will watch your condition.  Will get help right away if you are not doing well or get worse. Document Released: 10/07/2005 Document Revised: 12/30/2011 Document Reviewed: 09/06/2009 [facility] Patient Information 2014 [place], [place].   Patient is scheduled for surgical repair of umbilical hernia at [facility] for 12/30/13/ patient aware of date and all instructions. Patient will pre admit by phone.

## 2013-12-14 NOTE — Progress Notes (Addendum)
Patient ID: Jorge Boyle, male   DOB: [birth year], 38 y.o.   MRN: [id number]  Chief Complaint  Patient presents with  . Follow-up    ventral and umbilical hernia no studies    HPI Jorge Boyle is a 38 y.o. male who presents for an evaluation of a ventral and umbilical hernia. No studies performed. Patient was last seen for this in September 2014. He states he is having epigastric pain that started approximately 2 weeks ago. He also states he has pain near his naval that comes and goes. The pain is described as an achy sensation.He has a tingling sensation in this area that comes and goes.   HPI  Past Medical History  Diagnosis Date  . Nontoxic nodular goiter   . Obesity   . Allergic rhinitis   . Prediabetes   . History of chicken pox   . S/P excision of thyroid adenoma 2005    R side  . Multiple thyroid nodules 01/2012    left, biopsy x2 benign, rpt [place] stable (01/2013), consider rpt 1-2 yrs  . Diabetes mellitus without complication     Past Surgical History  Procedure Laterality Date  . Thyroidectomy, partial  2005    Right and isthmus  . Tonsillectomy and adenoidectomy  1985    Family History  Problem Relation Age of Onset  . Diabetes Mother   . Diabetes Father   . Stroke Maternal Grandmother   . Diabetes Maternal Grandfather   . Diabetes Paternal Grandmother   . Cancer Paternal Grandmother 90    breast  . Coronary artery disease Neg Hx   . Cancer Other     stomach, lung (smoker)    Social History History  Substance Use Topics  . Smoking status: Never Smoker   . Smokeless tobacco: Never Used  . Alcohol Use: Yes     Comment: rare    Allergies  Allergen Reactions  . Penicillins Rash    Current Outpatient Prescriptions  Medication Sig Dispense Refill  . Blood Glucose Monitoring Suppl (ONE TOUCH ULTRA SYSTEM KIT) W/DEVICE KIT Use as directed  1 each  0  . fluticasone (FLONASE) 50 MCG/ACT nasal spray Place 2 sprays into the nose as needed.      Marland Kitchen glucose  blood test strip 250.00 check sugars as needed.  30 each  11  . metFORMIN (GLUCOPHAGE) 500 MG tablet Take 1 tablet (500 mg total) by mouth at bedtime.  90 tablet  3   No current facility-administered medications for this visit.    Review of Systems Review of Systems  Constitutional: Negative.   Respiratory: Negative.   Cardiovascular: Negative.   Gastrointestinal: Positive for abdominal pain.    Blood pressure 168/86, pulse 84, resp. rate 16, height '5\' 10"'  (1.778 m), weight 281 lb (127.461 kg).  Physical Exam Physical Exam  Constitutional: He is oriented to person, place, and time. He appears well-developed and well-nourished.  Neck: Neck supple. No thyromegaly present.  Cardiovascular: Normal rate, regular rhythm and normal heart sounds.   No murmur heard. Pulmonary/Chest: Effort normal and breath sounds normal.  Abdominal: Soft. Normal appearance and bowel sounds are normal. There is tenderness in the periumbilical area. A hernia (2 cm umbilical hernia present) is present.    Lymphadenopathy:    He has no cervical adenopathy.  Neurological: He is alert and oriented to person, place, and time.  Skin: Skin is warm and dry.    Data Reviewed Hemoglobin A1c within normal values  Assessment  Symptomatic umbilical hernia with incarcerated fat.  Diastases recti.    Plan    The plan for umbilical hernia repair was reviewed. Now that his blood sugars are under good control the risk of infection is much lower. It's difficult to assess the actual fascial defect size because of the incarcerated fat, but if a primary repair is appropriate this will be undertaken. If prosthetic mesh is required this will be utilized. The risks associated with mesh placement were discussed with the patient.    Patient is scheduled for surgical repair of umbilical hernia at [facility] for 12/30/13/ patient aware of date and all instructions. Patient will pre admit by phone.   Robert Bellow 12/14/2013, 1:17 PM

## 2013-12-23 ENCOUNTER — Ambulatory Visit: Payer: Self-pay | Admitting: Anesthesiology

## 2013-12-23 LAB — BASIC METABOLIC PANEL
ANION GAP: 5 — AB (ref 7–16)
BUN: 8 mg/dL (ref 7–18)
CREATININE: 0.82 mg/dL (ref 0.60–1.30)
Calcium, Total: 8.6 mg/dL (ref 8.5–10.1)
Chloride: 105 mmol/L (ref 98–107)
Co2: 28 mmol/L (ref 21–32)
EGFR (African American): >60
Glucose: 102 mg/dL — ABNORMAL HIGH (ref 65–99)
Osmolality: 274 (ref 275–301)
Potassium: 3.7 mmol/L (ref 3.5–5.1)
Sodium: 138 mmol/L (ref 136–145)

## 2013-12-23 LAB — CBC
HCT: 42.6 % (ref 40.0–52.0)
HGB: 14.5 g/dL (ref 13.0–18.0)
MCH: 31 pg (ref 26.0–34.0)
MCHC: 34.2 g/dL (ref 32.0–36.0)
MCV: 91 fL (ref 80–100)
PLATELETS: 277 10*3/uL (ref 150–440)
RBC: 4.69 10*6/uL (ref 4.40–5.90)
RDW: 13.6 % (ref 11.5–14.5)
WBC: 12.6 10*3/uL — ABNORMAL HIGH (ref 3.8–10.6)

## 2013-12-24 ENCOUNTER — Telehealth: Payer: Self-pay | Admitting: Family Medicine

## 2013-12-24 NOTE — Telephone Encounter (Signed)
CAN scheduled patient for Saturday [facility]. I just received this as I was covering the lab and just now looked at my IN basket.

## 2013-12-24 NOTE — Telephone Encounter (Signed)
I could see pt today if he desires as I have an opening.

## 2013-12-24 NOTE — Telephone Encounter (Signed)
Patient Information:  Caller Name: Nathyn  Phone: [phone]  Patient: Jorge Boyle, Jorge Boyle  Gender: Male  DOB: December 11, 1975  Age: 38 Years  PCP: Ria [facility])  Office Follow Up:  Does the office need to follow up with this patient?: No  Instructions For The Office: N/A  RN Note:  Pt is wantng to come to the office today because he doesn't want to  reschedule this surgery he has scheduled for next week.  Symptoms  Reason For Call & Symptoms: Pt is calling and states that he is having sinus symptoms and he is scheduled for hernia surgery on 12/30/13;  sx include congestion, sinus pressure and pain and cough; unalbe to take OTC meds because of the scheduled surgery;  Reviewed Health History In EMR: Yes  Reviewed Medications In EMR: Yes  Reviewed Allergies In EMR: Yes  Reviewed Surgeries / Procedures: Yes  Date of Onset of Symptoms: 12/20/2013  Guideline(s) Used:  Sinus Pain and Congestion  Disposition Per Guideline:   See Today or Tomorrow in Office  Reason For Disposition Reached:   Lots of coughing  Advice Given:  For a Stuffy Nose - Use Nasal Washes:  Introduction: Saline (salt water) nasal irrigation (nasal wash) is an effective and simple home remedy for treating stuffy nose and sinus congestion. The nose can be irrigated by pouring, spraying, or squirting salt water into the nose and then letting it run back out.  Hydration:  Drink plenty of liquids (6-8 glasses of water daily). If the air in your home is dry, use a cool mist humidifier  Expected Course:  Sinus congestion from viral upper respiratory infections (colds) usually lasts 5-10 days.  Call Back If:   You become worse.  Patient Will Follow Care Advice:  YES  Appointment Scheduled:  12/25/2013 09:15:00 Appointment Scheduled Provider: Dr Randel Pigg at the [facility]- address given to pt

## 2013-12-25 ENCOUNTER — Encounter: Payer: Self-pay | Admitting: Family Medicine

## 2013-12-25 ENCOUNTER — Ambulatory Visit (INDEPENDENT_AMBULATORY_CARE_PROVIDER_SITE_OTHER): Payer: 59 | Admitting: Family Medicine

## 2013-12-25 VITALS — BP 130/90 | HR 81 | Temp 97.9°F | Ht 70.0 in | Wt 283.0 lb

## 2013-12-25 DIAGNOSIS — R030 Elevated blood-pressure reading, without diagnosis of hypertension: Secondary | ICD-10-CM

## 2013-12-25 DIAGNOSIS — R05 Cough: Secondary | ICD-10-CM

## 2013-12-25 DIAGNOSIS — IMO0001 Reserved for inherently not codable concepts without codable children: Secondary | ICD-10-CM

## 2013-12-25 DIAGNOSIS — J329 Chronic sinusitis, unspecified: Secondary | ICD-10-CM

## 2013-12-25 DIAGNOSIS — K429 Umbilical hernia without obstruction or gangrene: Secondary | ICD-10-CM

## 2013-12-25 DIAGNOSIS — R059 Cough, unspecified: Secondary | ICD-10-CM

## 2013-12-25 MED ORDER — HYDROCODONE-HOMATROPINE 5-1.5 MG/5ML PO SYRP: 5.0000 mL | ORAL_SOLUTION | Freq: Two times a day (BID) | ORAL | Status: DC | PRN

## 2013-12-25 MED ORDER — CIPROFLOXACIN HCL 500 MG PO TABS: 500.0000 mg | ORAL_TABLET | Freq: Two times a day (BID) | ORAL | Status: DC

## 2013-12-25 NOTE — Progress Notes (Signed)
Patient ID: Jorge Boyle, male   DOB: 12/04/1975, 38 y.o.   MRN: [id number] Zed Wanninger [id number] 01/22/1976 12/25/2013      Progress Note-Follow Up  Subjective  Chief Complaint  Chief Complaint  Patient presents with  . Sinus Problem    C/O Cough, headache, head congestion, & sore throat x 1 week. Tried Mucinex & Theraflu-no relief. Scheduled for Hernia surgery on Thursday, and can not be sick.    HPI  Patient is a 38 yo male in today with persistent head congestion and cough. He has been ill for roughly a week and is struggling with cough keeping him up qhs, malaise, myalgias. He has ha and head congestion. Has sputum that is green and blood tinged. He has hernia surgery scheduled for later this week. Denies any f/c/abdominal pain or GI or GU symptoms  Past Medical History  Diagnosis Date  . Nontoxic nodular goiter   . Obesity   . Allergic rhinitis   . Prediabetes   . History of chicken pox   . S/P excision of thyroid adenoma 2005    R side  . Multiple thyroid nodules 01/2012    left, biopsy x2 benign, rpt [place] stable (01/2013), consider rpt 1-2 yrs  . Diabetes mellitus without complication     Past Surgical History  Procedure Laterality Date  . Thyroidectomy, partial  2005    Right and isthmus  . Tonsillectomy and adenoidectomy  1985    Family History  Problem Relation Age of Onset  . Diabetes Mother   . Diabetes Father   . Stroke Maternal Grandmother   . Diabetes Maternal Grandfather   . Diabetes Paternal Grandmother   . Cancer Paternal Grandmother 55    breast  . Coronary artery disease Neg Hx   . Cancer Other     stomach, lung (smoker)    History   Social History  . Marital Status: Single    Spouse Name: N/A    Number of Children: N/A  . Years of Education: N/A   Occupational History  . Not on file.   Social History Main Topics  . Smoking status: Never Smoker   . Smokeless tobacco: Never Used  . Alcohol Use: Yes     Comment: rare  . Drug  Use: No  . Sexual Activity: Not on file   Other Topics Concern  . Not on file   Social History Narrative   Caffeine: 1-2 pots/day   Lives with twin brother, 2 dogs   Occupation: Journalist, newspaper at [employer]   Edu: HS   Activity: goes to gym 3x/wk   Diet:     Current Outpatient Prescriptions on File Prior to Visit  Medication Sig Dispense Refill  . Blood Glucose Monitoring Suppl (ONE TOUCH ULTRA SYSTEM KIT) W/DEVICE KIT Use as directed  1 each  0  . fluticasone (FLONASE) 50 MCG/ACT nasal spray Place 2 sprays into the nose as needed.      Marland Kitchen glucose blood test strip 250.00 check sugars as needed.  30 each  11  . metFORMIN (GLUCOPHAGE) 500 MG tablet Take 1 tablet (500 mg total) by mouth at bedtime.  90 tablet  3   No current facility-administered medications on file prior to visit.    Allergies  Allergen Reactions  . Penicillins Rash    Review of Systems  Review of Systems  Constitutional: Positive for malaise/fatigue. Negative for fever.  HENT: Positive for congestion and sore throat.   Eyes: Negative for discharge.  Respiratory: Positive for cough, sputum production and shortness of breath. Negative for wheezing.   Cardiovascular: Negative for chest pain, palpitations and leg swelling.  Gastrointestinal: Negative for nausea, abdominal pain and diarrhea.  Genitourinary: Negative for dysuria.  Musculoskeletal: Negative for falls.  Skin: Negative for rash.  Neurological: Positive for sensory change. Negative for loss of consciousness and headaches.  Endo/Heme/Allergies: Negative for polydipsia.  Psychiatric/Behavioral: Negative for depression and suicidal ideas. The patient is not nervous/anxious and does not have insomnia.     Objective  BP 130/90  Pulse 81  Temp(Src) 97.9 F (36.6 C) (Oral)  Ht '5\' 10"'  (1.778 m)  Wt 283 lb (128.368 kg)  BMI 40.61 kg/m2  SpO2 97%  Physical Exam  Physical Exam  Constitutional: He is oriented to person, place, and time  and well-developed, well-nourished, and in no distress. No distress.  HENT:  Head: Normocephalic and atraumatic.  TMs dull, red and retracted b/l oropharynx erythematous, no pustules. Nasal mucosa boggy and erythematous  Eyes: Conjunctivae are normal.  Neck: Neck supple. No thyromegaly present.  Cardiovascular: Normal rate, regular rhythm and normal heart sounds.   No murmur heard. Pulmonary/Chest: Effort normal and breath sounds normal. No respiratory distress.  Abdominal: He exhibits no distension and no mass. There is no tenderness.  Musculoskeletal: He exhibits no edema.  Neurological: He is alert and oriented to person, place, and time.  Skin: Skin is warm.  Psychiatric: Memory, affect and judgment normal.    Lab Results  Component Value Date   TSH 2.95 01/29/2013   Lab Results  Component Value Date   WBC 8.8 01/29/2013   HGB 14.6 01/29/2013   HCT 43.3 01/29/2013   MCV 89.7 01/29/2013   PLT 238.0 01/29/2013   Lab Results  Component Value Date   CREATININE 0.9 01/29/2013   BUN 13 01/29/2013   NA 137 01/29/2013   K 3.9 01/29/2013   CL 104 01/29/2013   CO2 20 01/29/2013   Lab Results  Component Value Date   ALT 37 01/02/2012   AST 25 01/02/2012   GGT 67 02/17/2006   ALKPHOS 53 01/02/2012   BILITOT 0.4 01/02/2012   Lab Results  Component Value Date   CHOL 185 05/21/2013   Lab Results  Component Value Date   HDL 35 05/21/2013   Lab Results  Component Value Date   LDLCALC 131* 01/02/2012   Lab Results  Component Value Date   TRIG 119 05/21/2013   Lab Results  Component Value Date   CHOLHDL 5 01/02/2012     Assessment & Plan  Umbilical hernia Having surgery this coming Thursday with Dr Tollie Pizza, will treat cough in hopes he can proceed with surgery  Elevated BP Acceptable numbers today despite acute illness  Sinusitis Started on Ciprofloxacin, probiotics, mucinex and Hydromet. Encouraged increased rest and hydration

## 2013-12-25 NOTE — Progress Notes (Signed)
Pre-visit discussion using our clinic review tool. No additional management support is needed unless otherwise documented below in the visit note.  

## 2013-12-25 NOTE — Assessment & Plan Note (Signed)
Started on Ciprofloxacin, probiotics, mucinex and Hydromet. Encouraged increased rest and hydration

## 2013-12-25 NOTE — Assessment & Plan Note (Signed)
Acceptable numbers today despite acute illness

## 2013-12-25 NOTE — Patient Instructions (Signed)
Plain Mucinex 600 mg twice a day x 10 days Probiotics daily x Align or Culturelle for the next month Increase rest and fluids  Sinusitis Sinusitis is redness, soreness, and swelling (inflammation) of the paranasal sinuses. Paranasal sinuses are air pockets within the bones of your face (beneath the eyes, the middle of the forehead, or above the eyes). In healthy paranasal sinuses, mucus is able to drain out, and air is able to circulate through them by way of your nose. However, when your paranasal sinuses are inflamed, mucus and air can become trapped. This can allow bacteria and other germs to grow and cause infection. Sinusitis can develop quickly and last only a short time (acute) or continue over a long period (chronic). Sinusitis that lasts for more than 12 weeks is considered chronic.  CAUSES  Causes of sinusitis include:  Allergies.  Structural abnormalities, such as displacement of the cartilage that separates your nostrils (deviated septum), which can decrease the air flow through your nose and sinuses and affect sinus drainage.  Functional abnormalities, such as when the small hairs (cilia) that line your sinuses and help remove mucus do not work properly or are not present. SYMPTOMS  Symptoms of acute and chronic sinusitis are the same. The primary symptoms are pain and pressure around the affected sinuses. Other symptoms include:  Upper toothache.  Earache.  Headache.  Bad breath.  Decreased sense of smell and taste.  A cough, which worsens when you are lying flat.  Fatigue.  Fever.  Thick drainage from your nose, which often is green and may contain pus (purulent).  Swelling and warmth over the affected sinuses. DIAGNOSIS  Your caregiver will perform a physical exam. During the exam, your caregiver may:  Look in your nose for signs of abnormal growths in your nostrils (nasal polyps).  Tap over the affected sinus to check for signs of infection.  View the  inside of your sinuses (endoscopy) with a special imaging device with a light attached (endoscope), which is inserted into your sinuses. If your caregiver suspects that you have chronic sinusitis, one or more of the following tests may be recommended:  Allergy tests.  Nasal culture A sample of mucus is taken from your nose and sent to a lab and screened for bacteria.  Nasal cytology A sample of mucus is taken from your nose and examined by your caregiver to determine if your sinusitis is related to an allergy. TREATMENT  Most cases of acute sinusitis are related to a viral infection and will resolve on their own within 10 days. Sometimes medicines are prescribed to help relieve symptoms (pain medicine, decongestants, nasal steroid sprays, or saline sprays).  However, for sinusitis related to a bacterial infection, your caregiver will prescribe antibiotic medicines. These are medicines that will help kill the bacteria causing the infection.  Rarely, sinusitis is caused by a fungal infection. In theses cases, your caregiver will prescribe antifungal medicine. For some cases of chronic sinusitis, surgery is needed. Generally, these are cases in which sinusitis recurs more than 3 times per year, despite other treatments. HOME CARE INSTRUCTIONS   Drink plenty of water. Water helps thin the mucus so your sinuses can drain more easily.  Use a humidifier.  Inhale steam 3 to 4 times a day (for example, sit in the bathroom with the shower running).  Apply a warm, moist washcloth to your face 3 to 4 times a day, or as directed by your caregiver.  Use saline nasal sprays to help  moisten and clean your sinuses.  Take over-the-counter or prescription medicines for pain, discomfort, or fever only as directed by your caregiver. SEEK IMMEDIATE MEDICAL CARE IF:  You have increasing pain or severe headaches.  You have nausea, vomiting, or drowsiness.  You have swelling around your face.  You have  vision problems.  You have a stiff neck.  You have difficulty breathing. MAKE SURE YOU:   Understand these instructions.  Will watch your condition.  Will get help right away if you are not doing well or get worse. Document Released: 10/07/2005 Document Revised: 12/30/2011 Document Reviewed: 10/22/2011 [facility] Patient Information 2014 [place], [place].

## 2013-12-25 NOTE — Assessment & Plan Note (Signed)
Having surgery this coming Thursday with Dr Tollie Pizza, will treat cough in hopes he can proceed with surgery

## 2013-12-27 ENCOUNTER — Ambulatory Visit: Payer: 59 | Admitting: Family Medicine

## 2013-12-29 ENCOUNTER — Encounter: Payer: Self-pay | Admitting: *Deleted

## 2013-12-29 NOTE — Progress Notes (Signed)
Patient ID: Jorge Boyle, male   DOB: Jun 29, 1976, 38 y.o.   MRN: [id number]   Patient stopped by to drop of disability paperwork and was asked if he is feeling better since he had a cold last week. This patient reports that he is because he went to see his doctor and he has no symptoms at this time. We will proceed with umbilical hernia repair that is scheduled for tomorrow, 12-30-13.

## 2013-12-30 ENCOUNTER — Ambulatory Visit: Payer: Self-pay | Admitting: General Surgery

## 2013-12-30 DIAGNOSIS — K429 Umbilical hernia without obstruction or gangrene: Secondary | ICD-10-CM

## 2013-12-30 HISTORY — PX: UMBILICAL HERNIA REPAIR: SHX196

## 2013-12-31 ENCOUNTER — Encounter: Payer: Self-pay | Admitting: General Surgery

## 2014-01-01 ENCOUNTER — Encounter: Payer: Self-pay | Admitting: Family Medicine

## 2014-01-03 ENCOUNTER — Ambulatory Visit: Payer: 59 | Admitting: Family Medicine

## 2014-01-10 ENCOUNTER — Encounter: Payer: Self-pay | Admitting: General Surgery

## 2014-01-10 ENCOUNTER — Ambulatory Visit (INDEPENDENT_AMBULATORY_CARE_PROVIDER_SITE_OTHER): Payer: 59 | Admitting: General Surgery

## 2014-01-10 VITALS — BP 118/82 | HR 72 | Resp 14 | Ht 69.0 in | Wt 289.0 lb

## 2014-01-10 DIAGNOSIS — K429 Umbilical hernia without obstruction or gangrene: Secondary | ICD-10-CM

## 2014-01-10 NOTE — Patient Instructions (Addendum)
Proper lifting techniques reviewed. 

## 2014-01-10 NOTE — Progress Notes (Signed)
Patient ID: Jorge Boyle, male   DOB: [birth year], 38 y.o.   MRN: [id number]  Chief Complaint  Patient presents with  . Routine Post Op    umbilical hernia     HPI Jorge Boyle is a 38 y.o. male here today for his post op umbilical hernia repair which was done on 12/30/13. Patient states he is doing well. Minimal pain. No recent narcotics required. HPI  Past Medical History  Diagnosis Date  . Nontoxic nodular goiter   . Obesity   . Allergic rhinitis   . Prediabetes   . History of chicken pox   . S/P excision of thyroid adenoma 2005    R side  . Multiple thyroid nodules 01/2012    left, biopsy x2 benign, rpt [place] stable (01/2013), consider rpt 1-2 yrs  . Diabetes mellitus without complication     Past Surgical History  Procedure Laterality Date  . Thyroidectomy, partial  2005    Right and isthmus  . Tonsillectomy and adenoidectomy  1985  . Umbilical hernia repair  2015    [place]  . Hernia repair  5/78/46    umbilical hernia    Family History  Problem Relation Age of Onset  . Diabetes Mother   . Diabetes Father   . Stroke Maternal Grandmother   . Diabetes Maternal Grandfather   . Diabetes Paternal Grandmother   . Cancer Paternal Grandmother 51    breast  . Coronary artery disease Neg Hx   . Cancer Other     stomach, lung (smoker)    Social History History  Substance Use Topics  . Smoking status: Never Smoker   . Smokeless tobacco: Never Used  . Alcohol Use: Yes     Comment: rare    Allergies  Allergen Reactions  . Penicillins Rash    Current Outpatient Prescriptions  Medication Sig Dispense Refill  . Blood Glucose Monitoring Suppl (ONE TOUCH ULTRA SYSTEM KIT) W/DEVICE KIT Use as directed  1 each  0  . ciprofloxacin (CIPRO) 500 MG tablet Take 1 tablet (500 mg total) by mouth 2 (two) times daily.  28 tablet  0  . fluticasone (FLONASE) 50 MCG/ACT nasal spray Place 2 sprays into the nose as needed.      Marland Kitchen glucose blood test strip 250.00 check sugars as  needed.  30 each  11  . HYDROcodone-homatropine (HYCODAN) 5-1.5 MG/5ML syrup Take 5 mLs by mouth 2 (two) times daily as needed for cough.  140 mL  0  . metFORMIN (GLUCOPHAGE) 500 MG tablet Take 1 tablet (500 mg total) by mouth at bedtime.  90 tablet  3   No current facility-administered medications for this visit.    Review of Systems Review of Systems  Constitutional: Negative.   Respiratory: Negative.   Cardiovascular: Negative.     Blood pressure 118/82, pulse 72, resp. rate 14, height _0  (1.753 m), weight 289 lb (131.09 kg).  Physical Exam Physical Exam  Constitutional: He is oriented to person, place, and time. He appears well-developed and well-nourished.  Abdominal:    Little bruising on the right umbilical site  Neurological: He is alert and oriented to person, place, and time.  Skin: Skin is warm and dry.    Data Reviewed Primary hernia repair.  Assessment    Doing well status post umbilical hernia repair.     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 I  anticipate he'll be able to return to work in one week. Care with lifting was reviewed. Proper lifting techniques demonstrated.        Robert Bellow 01/10/2014, 9:05 PM

## 2014-01-13 ENCOUNTER — Encounter: Payer: Self-pay | Admitting: *Deleted

## 2014-01-13 NOTE — Progress Notes (Signed)
Patient dropped off  A signed authorization for release of information.  Insurance call/papers to follow per pt.

## 2014-05-02 ENCOUNTER — Encounter: Payer: Self-pay | Admitting: Family Medicine

## 2014-05-02 ENCOUNTER — Ambulatory Visit (INDEPENDENT_AMBULATORY_CARE_PROVIDER_SITE_OTHER): Payer: 59 | Admitting: Family Medicine

## 2014-05-02 VITALS — BP 128/86 | HR 64 | Temp 98.1°F | Wt 284.5 lb

## 2014-05-02 DIAGNOSIS — M79675 Pain in left toe(s): Secondary | ICD-10-CM | POA: Insufficient documentation

## 2014-05-02 DIAGNOSIS — E119 Type 2 diabetes mellitus without complications: Secondary | ICD-10-CM

## 2014-05-02 DIAGNOSIS — K429 Umbilical hernia without obstruction or gangrene: Secondary | ICD-10-CM

## 2014-05-02 DIAGNOSIS — E041 Nontoxic single thyroid nodule: Secondary | ICD-10-CM

## 2014-05-02 DIAGNOSIS — M79609 Pain in unspecified limb: Secondary | ICD-10-CM

## 2014-05-02 LAB — HEMOGLOBIN A1C: Hgb A1c MFr Bld: 6.4 % (ref 4.6–6.5)

## 2014-05-02 NOTE — Assessment & Plan Note (Signed)
S/p repair

## 2014-05-02 NOTE — Progress Notes (Signed)
Pre visit review using our clinic review tool, if applicable. No additional management support is needed unless otherwise documented below in the visit note. 

## 2014-05-02 NOTE — Assessment & Plan Note (Signed)
Present for last month, worse last 1-2 days. rec start ibuprofen 600mg  twice daily with food for 3-5 days, update if not improved with this. Check uric acid today to evaluate for gout.

## 2014-05-02 NOTE — Patient Instructions (Signed)
Blood work today. No changes to metformin. We will check for gout today as well given great toe pain on left. In the meantime take ibuprofen 600mg  twice to three times daily with food for next 3-5 days. Good to see you today, call us with questions.

## 2014-05-02 NOTE — Progress Notes (Signed)
   BP 128/86  Pulse 64  Temp(Src) 98.1 F (36.7 C) (Oral)  Wt 284 lb 8 oz (129.048 kg)   CC: diabetic bundle  Subjective:    Patient ID: Jorge Boyle, male    DOB: 02/27/1976, 38 y.o.   MRN: [id number]  HPI: Jorge Boyle is a 38 y.o. male presenting on 05/02/2014 for Follow-up   Epic comes in today for labwork to complete his diabetic bundle (A1c, LDL).  Overall doing well.  Frustrated because work keeps changing his scheduled from 1st to 2nd shift.  Noticing swelling and pain L medial great toe at medial IP joint. No pain or erythema at nail fold  Relevant past medical, surgical, family and social history reviewed and updated as indicated.  Allergies and medications reviewed and updated. Current Outpatient Prescriptions on File Prior to Visit  Medication Sig  . Blood Glucose Monitoring Suppl (ONE TOUCH ULTRA SYSTEM KIT) W/DEVICE KIT Use as directed  . glucose blood test strip 250.00 check sugars as needed.  . metFORMIN (GLUCOPHAGE) 500 MG tablet Take 1 tablet (500 mg total) by mouth at bedtime.  . fluticasone (FLONASE) 50 MCG/ACT nasal spray Place 2 sprays into the nose as needed.   No current facility-administered medications on file prior to visit.    Review of Systems Per HPI unless specifically indicated above    Objective:    BP 128/86  Pulse 64  Temp(Src) 98.1 F (36.7 C) (Oral)  Wt 284 lb 8 oz (129.048 kg)  Physical Exam  Nursing note and vitals reviewed. Constitutional: He appears well-developed and well-nourished. No distress.  Musculoskeletal: He exhibits no edema.  L great toe medial to IP joint with erythema and swelling, mildly tender to palpation but no pain with axial loading, no pain with flexion/extension at joint 2+ DP bilaterally       Assessment & Plan:   Problem List Items Addressed This Visit   Umbilical hernia     S/p repair.    Toe pain, left     Present for last month, worse last 1-2 days. rec start ibuprofen 622m twice  daily with food for 3-5 days, update if not improved with this. Check uric acid today to evaluate for gout.    Relevant Orders      Uric acid   Thyroid nodule     Check TFTs today, consider rpt [place]    Relevant Orders      TSH      T4, Free   Diabetes type 2, controlled - Primary     Chronic, stable. Check A1c today. Foot exam today.    Relevant Orders      Lipid panel      Hemoglobin A1c      Basic metabolic panel      HM DIABETES FOOT EXAM (Completed)       Follow up plan: Return in about 6 months (around 11/02/2014), or if symptoms worsen or fail to improve, for follow up visit.

## 2014-05-02 NOTE — Assessment & Plan Note (Signed)
Chronic, stable. Check A1c today. Foot exam today. 

## 2014-05-02 NOTE — Assessment & Plan Note (Signed)
Check TFTs today, consider rpt [place] 01/2015

## 2014-05-03 LAB — LIPID PANEL
CHOLESTEROL: 173 mg/dL (ref 0–200)
HDL: 34.2 mg/dL — ABNORMAL LOW (ref >39.00)
LDL Cholesterol: 129 mg/dL — ABNORMAL HIGH (ref 0–99)
NonHDL: 138.8
TRIGLYCERIDES: 49 mg/dL (ref 0.0–149.0)
Total CHOL/HDL Ratio: 5
VLDL: 9.8 mg/dL (ref 0.0–40.0)

## 2014-05-03 LAB — BASIC METABOLIC PANEL
BUN: 8 mg/dL (ref 6–23)
CHLORIDE: 108 meq/L (ref 96–112)
CO2: 27 meq/L (ref 19–32)
Calcium: 8.7 mg/dL (ref 8.4–10.5)
Creatinine, Ser: 0.8 mg/dL (ref 0.4–1.5)
GFR: 119.79 mL/min (ref >60.00)
Glucose, Bld: 107 mg/dL — ABNORMAL HIGH (ref 70–99)
POTASSIUM: 4.1 meq/L (ref 3.5–5.1)
SODIUM: 139 meq/L (ref 135–145)

## 2014-05-03 LAB — URIC ACID: URIC ACID, SERUM: 8.2 mg/dL — AB (ref 4.0–7.8)

## 2014-05-03 LAB — T4, FREE: Free T4: 0.93 ng/dL (ref 0.60–1.60)

## 2014-05-03 LAB — TSH: TSH: 2.21 u[IU]/mL (ref 0.35–4.50)

## 2014-05-04 ENCOUNTER — Encounter: Payer: Self-pay | Admitting: *Deleted

## 2014-05-10 ENCOUNTER — Telehealth: Payer: Self-pay | Admitting: Family Medicine

## 2014-05-10 MED ORDER — COLCHICINE 0.6 MG PO TABS: 0.6000 mg | ORAL_TABLET | Freq: Every day | ORAL | Status: DC | PRN

## 2014-05-10 NOTE — Telephone Encounter (Signed)
Patient advised.

## 2014-05-10 NOTE — Telephone Encounter (Signed)
Take colchicine once a day for likely gout.  When better, he can stop.  Update PCP prn.  If the med is really expensive, then he can get a partial fill on the rx.

## 2014-05-10 NOTE — Telephone Encounter (Signed)
Uric acid level elevated at last OV last week. Toe is no better and he is requesting Rx. Dr. Darnell Level is not here. Could you send something in for him please? Thanks!

## 2014-05-10 NOTE — Telephone Encounter (Signed)
Pt request rx be called in for gout. Toe is still bothering him really bad. [facility]. Please advise.

## 2014-07-22 ENCOUNTER — Other Ambulatory Visit: Payer: Self-pay | Admitting: Family Medicine

## 2014-10-17 ENCOUNTER — Other Ambulatory Visit: Payer: Self-pay | Admitting: Family Medicine

## 2015-01-04 ENCOUNTER — Ambulatory Visit (INDEPENDENT_AMBULATORY_CARE_PROVIDER_SITE_OTHER): Payer: 59 | Admitting: Family Medicine

## 2015-01-04 ENCOUNTER — Encounter: Payer: Self-pay | Admitting: Family Medicine

## 2015-01-04 VITALS — BP 124/82 | HR 84 | Temp 98.3°F | Ht 69.5 in | Wt 283.0 lb

## 2015-01-04 DIAGNOSIS — IMO0001 Reserved for inherently not codable concepts without codable children: Secondary | ICD-10-CM

## 2015-01-04 DIAGNOSIS — E669 Obesity, unspecified: Secondary | ICD-10-CM

## 2015-01-04 DIAGNOSIS — E119 Type 2 diabetes mellitus without complications: Secondary | ICD-10-CM

## 2015-01-04 DIAGNOSIS — E041 Nontoxic single thyroid nodule: Secondary | ICD-10-CM

## 2015-01-04 DIAGNOSIS — Z0001 Encounter for general adult medical examination with abnormal findings: Secondary | ICD-10-CM | POA: Insufficient documentation

## 2015-01-04 DIAGNOSIS — R030 Elevated blood-pressure reading, without diagnosis of hypertension: Secondary | ICD-10-CM

## 2015-01-04 DIAGNOSIS — Z0000 Encounter for general adult medical examination without abnormal findings: Secondary | ICD-10-CM

## 2015-01-04 LAB — COMPREHENSIVE METABOLIC PANEL
ALBUMIN: 4 g/dL (ref 3.5–5.2)
ALK PHOS: 66 U/L (ref 39–117)
ALT: 39 U/L (ref 0–53)
AST: 29 U/L (ref 0–37)
BILIRUBIN TOTAL: 0.5 mg/dL (ref 0.2–1.2)
BUN: 12 mg/dL (ref 6–23)
CO2: 27 meq/L (ref 19–32)
CREATININE: 0.82 mg/dL (ref 0.40–1.50)
Calcium: 9.1 mg/dL (ref 8.4–10.5)
Chloride: 105 mEq/L (ref 96–112)
GFR: 111.02 mL/min (ref >60.00)
GLUCOSE: 109 mg/dL — AB (ref 70–99)
POTASSIUM: 4.1 meq/L (ref 3.5–5.1)
SODIUM: 138 meq/L (ref 135–145)
TOTAL PROTEIN: 7.1 g/dL (ref 6.0–8.3)

## 2015-01-04 LAB — LIPID PANEL
Cholesterol: 177 mg/dL (ref 0–200)
HDL: 30.8 mg/dL — ABNORMAL LOW (ref >39.00)
LDL Cholesterol: 126 mg/dL — ABNORMAL HIGH (ref 0–99)
NONHDL: 146.2
Total CHOL/HDL Ratio: 6
Triglycerides: 103 mg/dL (ref 0.0–149.0)
VLDL: 20.6 mg/dL (ref 0.0–40.0)

## 2015-01-04 LAB — TSH: TSH: 2.52 u[IU]/mL (ref 0.35–4.50)

## 2015-01-04 LAB — T4, FREE: Free T4: 0.84 ng/dL (ref 0.60–1.60)

## 2015-01-04 LAB — MICROALBUMIN / CREATININE URINE RATIO
CREATININE, U: 143.8 mg/dL
Microalb Creat Ratio: 0.5 mg/g (ref 0.0–30.0)
Microalb, Ur: <0.7 mg/dL (ref 0.0–1.9)

## 2015-01-04 LAB — HEMOGLOBIN A1C: Hgb A1c MFr Bld: 6.4 % (ref 4.6–6.5)

## 2015-01-04 MED ORDER — METFORMIN HCL 500 MG PO TABS: ORAL_TABLET | ORAL | Status: DC

## 2015-01-04 MED ORDER — FLUTICASONE PROPIONATE 50 MCG/ACT NA SUSP: 2.0000 | NASAL | Status: DC | PRN

## 2015-01-04 NOTE — Assessment & Plan Note (Signed)
bp better controlled today.

## 2015-01-04 NOTE — Progress Notes (Signed)
BP 124/82 mmHg  Pulse 84  Temp(Src) 98.3 F (36.8 C) (Oral)  Ht 5' 9.5" (1.765 m)  Wt 283 lb (128.368 kg)  BMI 41.21 kg/m2   CC: CPE  Subjective:    Patient ID: Jorge Boyle, male    DOB: 08-30-1976, 39 y.o.   MRN: [id number]  HPI: Jorge Boyle is a 39 y.o. male presenting on 01/04/2015 for Annual Exam   H/o R thyroidectomy for adenoma. H/o L thyroid nodules s/p stable biopsy 2013, stable on rpt [place] 2014. Needs form filled for work.  Preventative: Flu - not done Tdap 12/2011  Seat belt use discussed. Sunscreen use discussed. Denies changing moles on skin.  Caffeine: 1-2 cups /day Lives with twin brother, 2 dogs Occupation: Journalist, newspaper at [employer] Edu: HS Activity: goes to gym 3x/wk Diet: good water, fruits/vegetables some   Relevant past medical, surgical, family and social history reviewed and updated as indicated. Interim medical history since our last visit reviewed. Allergies and medications reviewed and updated. Current Outpatient Prescriptions on File Prior to Visit  Medication Sig  . Blood Glucose Monitoring Suppl (ONE TOUCH ULTRA SYSTEM KIT) W/DEVICE KIT Use as directed  . glucose blood test strip 250.00 check sugars as needed.   No current facility-administered medications on file prior to visit.    Review of Systems  Constitutional: Negative for fever, chills, activity change, appetite change, fatigue and unexpected weight change.  HENT: Negative for hearing loss.   Eyes: Negative for visual disturbance.  Respiratory: Negative for cough, chest tightness, shortness of breath and wheezing.   Cardiovascular: Negative for chest pain, palpitations and leg swelling.  Gastrointestinal: Negative for nausea, vomiting, abdominal pain, diarrhea, constipation, blood in stool and abdominal distention.  Genitourinary: Negative for hematuria and difficulty urinating.  Musculoskeletal: Negative for myalgias, arthralgias and neck pain.  Skin:  Negative for rash.  Neurological: Negative for dizziness, seizures, syncope and headaches.  Hematological: Negative for adenopathy. Does not bruise/bleed easily.  Psychiatric/Behavioral: Negative for dysphoric mood. The patient is not nervous/anxious.    Per HPI unless specifically indicated above     Objective:    BP 124/82 mmHg  Pulse 84  Temp(Src) 98.3 F (36.8 C) (Oral)  Ht 5' 9.5" (1.765 m)  Wt 283 lb (128.368 kg)  BMI 41.21 kg/m2  Wt Readings from Last 3 Encounters:  01/04/15 283 lb (128.368 kg)  05/02/14 284 lb 8 oz (129.048 kg)  01/10/14 289 lb (131.09 kg)    Physical Exam  Constitutional: He is oriented to person, place, and time. He appears well-developed and well-nourished. No distress.  HENT:  Head: Normocephalic and atraumatic.  Right Ear: Hearing, tympanic membrane, external ear and ear canal normal.  Left Ear: Hearing, tympanic membrane, external ear and ear canal normal.  Nose: Nose normal.  Mouth/Throat: Uvula is midline, oropharynx is clear and moist and mucous membranes are normal. No oropharyngeal exudate, posterior oropharyngeal edema or posterior oropharyngeal erythema.  Eyes: Conjunctivae and EOM are normal. Pupils are equal, round, and reactive to light. No scleral icterus.  Neck: Normal range of motion. Neck supple. Thyromegaly (no nodule appreciated today. mild thyromegaly on left) present.  Cardiovascular: Normal rate, regular rhythm, normal heart sounds and intact distal pulses.   No murmur heard. Pulses:      Radial pulses are 2+ on the right side, and 2+ on the left side.  Pulmonary/Chest: Effort normal and breath sounds normal. No respiratory distress. He has no wheezes. He has no rales.  Abdominal:  Soft. Bowel sounds are normal. He exhibits no distension and no mass. There is no tenderness. There is no rebound and no guarding.  Musculoskeletal: Normal range of motion. He exhibits no edema.  Lymphadenopathy:    He has no cervical adenopathy.    Neurological: He is alert and oriented to person, place, and time.  CN grossly intact, station and gait intact  Skin: Skin is warm and dry. No rash noted.  Psychiatric: He has a normal mood and affect. His behavior is normal. Judgment and thought content normal.  Nursing note and vitals reviewed.      Assessment & Plan:   Problem List Items Addressed This Visit    Thyroid nodule    S/p R thyroidectomy, known L thyroid nodule s/p benign biopsy 2013 then stable [place] 2014. Will rpt [place] and have ordered today. Check TSH and free T4 today. Pt agrees with plan.      Relevant Orders   TSH   T4, free   Obesity    Body mass index is 41.21 kg/(m^2).  Reviewed healthy diet and lifestyle changes to affect sustainable weight loss.      Relevant Medications   metFORMIN (GLUCOPHAGE) tablet   Other Relevant Orders   Comprehensive metabolic panel   Lipid panel   Health maintenance examination - Primary    Preventative protocols reviewed and updated unless pt declined. Discussed healthy diet and lifestyle.       Elevated BP    bp better controlled today.      Diabetes type 2, controlled    Chronic, stable. Check A1c today and continue metformin 523m daily.      Relevant Medications   metFORMIN (GLUCOPHAGE) tablet   Other Relevant Orders   Microalbumin / creatinine urine ratio   Hemoglobin A1c       Follow up plan: Return in about 6 months (around 07/07/2015), or as needed, for follow up visit.

## 2015-01-04 NOTE — Assessment & Plan Note (Signed)
Preventative protocols reviewed and updated unless pt declined. Discussed healthy diet and lifestyle.  

## 2015-01-04 NOTE — Assessment & Plan Note (Addendum)
Body mass index is 41.21 kg/(m^2).  Reviewed healthy diet and lifestyle changes to affect sustainable weight loss.

## 2015-01-04 NOTE — Patient Instructions (Addendum)
Blood work today. We will schedule you for thyroid ultrasound for follow up of left thyroid nodules. Good to see you today, call us with questions.  Return in 6 months for diabetes follow up.

## 2015-01-04 NOTE — Assessment & Plan Note (Signed)
Chronic, stable. Check A1c today and continue metformin 500mg  daily.

## 2015-01-04 NOTE — Assessment & Plan Note (Signed)
S/p R thyroidectomy, known L thyroid nodule s/p benign biopsy 2013 then stable [place] 2014. Will rpt [place] and have ordered today. Check TSH and free T4 today. Pt agrees with plan.

## 2015-01-04 NOTE — Progress Notes (Signed)
Pre visit review using our clinic review tool, if applicable. No additional management support is needed unless otherwise documented below in the visit note. 

## 2015-01-05 ENCOUNTER — Encounter: Payer: Self-pay | Admitting: *Deleted

## 2015-02-11 NOTE — Op Note (Signed)
PATIENT NAME:  Jorge Boyle, Jorge Boyle MR#:  [id number] DATE OF BIRTH:  04/29/1976  DATE OF PROCEDURE:  12/30/2013  PREOPERATIVE DIAGNOSIS:  Umbilical hernia with incarcerated fat.   POSTOPERATIVE DIAGNOSIS:  Umbilical hernia with incarcerated fat.   OPERATIVE PROCEDURE:  Umbilical hernia repair.   SURGEON:  Hervey Ard, M.D.   ANESTHESIA:  General by LMA under Dr. Carolin Sicks, Marcaine 0.5% plain, 30 mL local infiltration, Toradol 30 mg.   ESTIMATED BLOOD LOSS:  Minimal.   CLINICAL NOTE:  This 39 year old male has developed an umbilical hernia with incarcerated fat.  He is admitted for elective repair.  He received Kefzol prior to the procedure.   OPERATIVE NOTE:  With the patient under adequate general anesthesia and hair previously removed from the surgical site with clippers, the abdomen was prepped with ChloraPrep and draped.  Marcaine was infiltrated for postoperative analgesia.  An infraumbilical incision was made and carried down through the skin and subcutaneous tissue.  Hemostasis was with electrocautery.  The umbilical skin was elevated off the sac with sharp dissection.  The hernia sac was then freed circumferentially and at the level of the fascia was transected with cautery.  The fascial defect was less than a centimeter in diameter.  It was necessary to make a left lateral extension to return the omentum into the abdominal cavity.  After this was completed the undersurface of the fascia was cleared and interrupted 0 Surgilon sutures were placed under direct vision.  These were sequentially tied.  Marcaine and Toradol was infiltrated for postoperative analgesia.  The adipose layer was closed in multiple layers with a running 3-0 Vicryl.  The skin was closed with running 4-0 Vicryl subcuticular suture.  Benzoin and Steri-Strips followed by Telfa and Tegaderm dressing was applied.   The patient tolerated the procedure well and was taken to the recovery room in stable condition.      ____________________________ Robert Bellow, MD jwb:ea D: 12/30/2013 16:27:08 ET T: 12/31/2013 05:12:33 ET JOB#: [id number]  cc: Robert Bellow, MD, <Dictator> Ria Bush, MD Valta Dillon Amedeo Kinsman MD ELECTRONICALLY SIGNED 12/31/2013 11:17

## 2015-05-26 ENCOUNTER — Encounter: Payer: Self-pay | Admitting: Family Medicine

## 2015-05-26 ENCOUNTER — Ambulatory Visit (INDEPENDENT_AMBULATORY_CARE_PROVIDER_SITE_OTHER): Payer: 59 | Admitting: Family Medicine

## 2015-05-26 VITALS — BP 122/86 | HR 74 | Temp 97.9°F | Ht 69.5 in | Wt 286.2 lb

## 2015-05-26 DIAGNOSIS — M1000 Idiopathic gout, unspecified site: Secondary | ICD-10-CM

## 2015-05-26 DIAGNOSIS — M109 Gout, unspecified: Secondary | ICD-10-CM | POA: Insufficient documentation

## 2015-05-26 LAB — URIC ACID: URIC ACID, SERUM: 7.7 mg/dL (ref 4.0–7.8)

## 2015-05-26 MED ORDER — COLCHICINE 0.6 MG PO TABS: 0.6000 mg | ORAL_TABLET | Freq: Every day | ORAL | Status: DC | PRN

## 2015-05-26 NOTE — Patient Instructions (Signed)
Take the colchicine for gout as needed (with food) Avoid high purine foods Drink lots of water  Elevate foot when you can  Lab today for uric acid  Follow up with Dr Darnell Level as planned - he may want to discuss medication to prevent gout flares

## 2015-05-26 NOTE — Progress Notes (Signed)
Subjective:    Patient ID: Jorge Boyle, male    DOB: 12/04/1975, 39 y.o.   MRN: [id number]  HPI Here with pain in L great toe Thinks it is gout   Has had it once before  Had "pills" last summer - it got better   Dad has had gout   Lab Results  Component Value Date   URICACID 8.2 02/17/2006   Wears steel toed boot  Symptoms for a week  Cover hurts it in bed   Seldom drinks beer anymore  Does not know what foods to watch out for  He makes an effort to drink a lot of water at work - very hot environment   Patient Active Problem List   Diagnosis Date Noted  . Gout 05/26/2015  . Health maintenance examination 01/04/2015  . Ventral hernia 11/22/2013  . Elevated BP 07/06/2013  . Umbilical hernia 19/41/7408  . Sinus congestion 01/29/2013  . Diabetes type 2, controlled   . Thyroid nodule 01/14/2012  . S/P excision of thyroid adenoma 01/01/2012  . Obesity 01/01/2012   Past Medical History  Diagnosis Date  . Nontoxic nodular goiter   . Obesity   . Allergic rhinitis   . Prediabetes   . History of chicken pox   . S/P excision of thyroid adenoma 2005    R thyroidectomy  . Multiple thyroid nodules 01/2012    left, biopsy x2 benign, rpt [place] stable (01/2013)  . Diabetes mellitus without complication    Past Surgical History  Procedure Laterality Date  . Thyroidectomy, partial  2005    Right and isthmus  . Tonsillectomy and adenoidectomy  1985  . Umbilical hernia repair  2015    [place]  . Hernia repair  1/44/81    umbilical hernia   History  Substance Use Topics  . Smoking status: Never Smoker   . Smokeless tobacco: Never Used  . Alcohol Use: 0.0 oz/week    0 Standard drinks or equivalent per week     Comment: rare   Family History  Problem Relation Age of Onset  . Diabetes Mother   . Diabetes Father   . Stroke Maternal Grandmother   . Diabetes Maternal Grandfather   . Diabetes Paternal Grandmother   . Cancer Paternal Grandmother 59    breast  .  Coronary artery disease Neg Hx   . Cancer Other     stomach, lung (smoker)   Allergies  Allergen Reactions  . Penicillins Rash   Current Outpatient Prescriptions on File Prior to Visit  Medication Sig Dispense Refill  . Blood Glucose Monitoring Suppl (ONE TOUCH ULTRA SYSTEM KIT) W/DEVICE KIT Use as directed 1 each 0  . fluticasone (FLONASE) 50 MCG/ACT nasal spray Place 2 sprays into both nostrils as needed. 16 g 3  . glucose blood test strip 250.00 check sugars as needed. 30 each 11  . metFORMIN (GLUCOPHAGE) 500 MG tablet TAKE 1 TABLET (500 MG TOTAL) BY MOUTH AT BEDTIME. 90 tablet 3   No current facility-administered medications on file prior to visit.    Review of Systems Review of Systems  Constitutional: Negative for fever, appetite change, fatigue and unexpected weight change.  Eyes: Negative for pain and visual disturbance.  Respiratory: Negative for cough and shortness of breath.   Cardiovascular: Negative for cp or palpitations    Gastrointestinal: Negative for nausea, diarrhea and constipation.  Genitourinary: Negative for urgency and frequency.  Skin: Negative for pallor or rash   MSK pos for L  great toe pain /swelling and redness/ neg for other joint changes  Neurological: Negative for weakness, light-headedness, numbness and headaches.  Hematological: Negative for adenopathy. Does not bruise/bleed easily.  Psychiatric/Behavioral: Negative for dysphoric mood. The patient is not nervous/anxious.         Objective:   Physical Exam  Constitutional: He appears well-developed and well-nourished. No distress.  obese and well appearing   HENT:  Head: Normocephalic and atraumatic.  Mouth/Throat: Oropharynx is clear and moist.  Eyes: Conjunctivae and EOM are normal. Pupils are equal, round, and reactive to light.  Neck: Normal range of motion. Neck supple. No JVD present. Carotid bruit is not present. No thyromegaly present.  Cardiovascular: Normal rate, regular rhythm,  normal heart sounds and intact distal pulses.  Exam reveals no gallop.   Pulmonary/Chest: Effort normal and breath sounds normal. No respiratory distress. He has no wheezes. He has no rales.  No crackles  Abdominal: Soft. Bowel sounds are normal. He exhibits no distension, no abdominal bruit and no mass. There is no tenderness.  Musculoskeletal: He exhibits no edema.  L great toe MTP joint is warm/erythematous/tender and swollen No tophus noted Resembles gout  Nl rom with pain   Nl perf/sens  Lymphadenopathy:    He has no cervical adenopathy.  Neurological: He is alert. He has normal reflexes.  Skin: Skin is warm and dry. No rash noted.  Psychiatric: He has a normal mood and affect.          Assessment & Plan:   Problem List Items Addressed This Visit    Gout - Primary    2nd episode -last 1 y ago  Lab Results  Component Value Date   URICACID 8.2 02/17/2006  will re check this today May be candidate for proph med  Disc imp of water intake Px colchicine-worked well in the past  Handouts on both gout and low purine diet given F/u with Dr Darnell Level in 2 wk as planned - rev further and re check       Relevant Orders   Uric acid (Completed)

## 2015-05-26 NOTE — Progress Notes (Signed)
Pre visit review using our clinic review tool, if applicable. No additional management support is needed unless otherwise documented below in the visit note. 

## 2015-05-26 NOTE — Assessment & Plan Note (Signed)
2nd episode -last 1 y ago  Lab Results  Component Value Date   URICACID 8.2 02/17/2006  will re check this today May be candidate for proph med  Disc imp of water intake Px colchicine-worked well in the past  Handouts on both gout and low purine diet given F/u with Dr Darnell Level in 2 wk as planned - rev further and re check

## 2015-07-12 ENCOUNTER — Ambulatory Visit: Payer: 59 | Admitting: Family Medicine

## 2015-07-26 ENCOUNTER — Ambulatory Visit (INDEPENDENT_AMBULATORY_CARE_PROVIDER_SITE_OTHER): Payer: 59 | Admitting: Family Medicine

## 2015-07-26 ENCOUNTER — Encounter: Payer: Self-pay | Admitting: Family Medicine

## 2015-07-26 VITALS — BP 126/84 | HR 72 | Temp 98.1°F | Wt 281.8 lb

## 2015-07-26 DIAGNOSIS — M1000 Idiopathic gout, unspecified site: Secondary | ICD-10-CM

## 2015-07-26 DIAGNOSIS — E119 Type 2 diabetes mellitus without complications: Secondary | ICD-10-CM

## 2015-07-26 DIAGNOSIS — E041 Nontoxic single thyroid nodule: Secondary | ICD-10-CM | POA: Diagnosis not present

## 2015-07-26 DIAGNOSIS — Z23 Encounter for immunization: Secondary | ICD-10-CM | POA: Diagnosis not present

## 2015-07-26 LAB — BASIC METABOLIC PANEL
BUN: 8 mg/dL (ref 6–23)
CALCIUM: 9 mg/dL (ref 8.4–10.5)
CHLORIDE: 104 meq/L (ref 96–112)
CO2: 29 mEq/L (ref 19–32)
Creatinine, Ser: 0.81 mg/dL (ref 0.40–1.50)
GFR: 112.28 mL/min (ref >60.00)
Glucose, Bld: 111 mg/dL — ABNORMAL HIGH (ref 70–99)
Potassium: 3.7 mEq/L (ref 3.5–5.1)
SODIUM: 139 meq/L (ref 135–145)

## 2015-07-26 LAB — HEMOGLOBIN A1C: HEMOGLOBIN A1C: 6.4 % (ref 4.6–6.5)

## 2015-07-26 MED ORDER — ALLOPURINOL 100 MG PO TABS: 100.0000 mg | ORAL_TABLET | Freq: Every day | ORAL | Status: DC

## 2015-07-26 NOTE — Assessment & Plan Note (Signed)
Chronic. Is not regular with metformin. Check A1c today. Pneumovax today. Foot exam today. Encouraged he schedule eye exam.

## 2015-07-26 NOTE — Patient Instructions (Addendum)
Flu shot and pneumovax today. labwork today Schedule eye exam as we're due. Start allopurinol 100mg  daily for gout prevention.  Pass by [facility]'s office for referral for thyroid ultrasound. Return in 6 months for physical.

## 2015-07-26 NOTE — Progress Notes (Signed)
BP 126/84 mmHg  Pulse 72  Temp(Src) 98.1 F (36.7 C) (Oral)  Wt 281 lb 12 oz (127.801 kg)   CC: 3mof/u visit Subjective:    Patient ID: Jorge Boyle male    DOB: [birth year] 39y.o.   MRN: [id number] HPI: RFIN HUPPis a 39y.o. male presenting on 07/26/2015 for Follow-up   Fasting today.  DM - regularly does not check sugars; randomly 120-200. Has not been compliant with antihyperglycemic regimen which includes: metformin 5012mdaily - forgets to take pill. Denies low sugars or hypoglycemic symptoms. Denies paresthesias. Last diabetic eye exam DUE. Pneumovax: today. Prevnar: not due. Lab Results  Component Value Date   HGBA1C 6.4 01/04/2015   Diabetic Foot Exam - Simple   Simple Foot Form  Diabetic Foot exam was performed with the following findings:  Yes 07/26/2015 12:16 PM  Visual Inspection  No deformities, no ulcerations, no other skin breakdown bilaterally:  Yes  Sensation Testing  See comments:  Yes  Pulse Check  Posterior Tibialis and Dorsalis pulse intact bilaterally:  Yes  Comments  Slightly decreased sensation to monofilament testing bilateral dorsal feet      Gout - 2nd flare 05/2015. Saw Dr T, treated with colchicine. sxs resolved. Urate was 7.7.   Due for thyroid [place] last visit[place]   Relevant past medical, surgical, family and social history reviewed and updated as indicated. Interim medical history since our last visit reviewed. Allergies and medications reviewed and updated. Current Outpatient Prescriptions on File Prior to Visit  Medication Sig  . Blood Glucose Monitoring Suppl (ONE TOUCH ULTRA SYSTEM KIT) W/DEVICE KIT Use as directed  . fluticasone (FLONASE) 50 MCG/ACT nasal spray Place 2 sprays into both nostrils as needed.  . Marland Kitchenlucose blood test strip 250.00 check sugars as needed.  . metFORMIN (GLUCOPHAGE) 500 MG tablet TAKE 1 TABLET (500 MG TOTAL) BY MOUTH AT BEDTIME.   No current facility-administered medications on file  prior to visit.    Review of Systems Per HPI unless specifically indicated above     Objective:    BP 126/84 mmHg  Pulse 72  Temp(Src) 98.1 F (36.7 C) (Oral)  Wt 281 lb 12 oz (127.801 kg)  Wt Readings from Last 3 Encounters:  07/26/15 281 lb 12 oz (127.801 kg)  05/26/15 286 lb 4 oz (129.842 kg)  01/04/15 283 lb (128.368 kg)   Body mass index is 41.02 kg/(m^2).  Physical Exam  Constitutional: He appears well-developed and well-nourished. No distress.  HENT:  Head: Normocephalic and atraumatic.  Right Ear: External ear normal.  Left Ear: External ear normal.  Nose: Nose normal.  Mouth/Throat: Oropharynx is clear and moist. No oropharyngeal exudate.  Eyes: Conjunctivae and EOM are normal. Pupils are equal, round, and reactive to light. No scleral icterus.  Neck: Normal range of motion. Neck supple. No thyromegaly present.  Some scarring R thyroid region s/p R thyroidectomy  Cardiovascular: Normal rate, regular rhythm, normal heart sounds and intact distal pulses.   No murmur heard. Pulmonary/Chest: Effort normal and breath sounds normal. No respiratory distress. He has no wheezes. He has no rales.  Musculoskeletal: He exhibits no edema.  See HPI for foot exam if done  Lymphadenopathy:    He has no cervical adenopathy.  Skin: Skin is warm and dry. No rash noted.  Psychiatric: He has a normal mood and affect.  Nursing note and vitals reviewed.  Results for orders placed or performed in visit on 05/26/15  Uric acid  Result Value Ref Range   Uric Acid, Serum 7.7 4.0 - 7.8 mg/dL   Lab Results  Component Value Date   HGBA1C 6.4 01/04/2015       Assessment & Plan:   Problem List Items Addressed This Visit    Thyroid nodule    Due for thyroid ultrasound (last 2014) - never ordered 12/2014. Will reorder today. TFTs stable.      Relevant Orders   US Soft Tissue Head/Neck   Obesity, morbid, BMI 40.0-49.9 (HCC)    Body mass index is 41.02 kg/(m^2).       Gout     Discussed indications for urate lowering medication. Pt interested in staving off future flare. Will start allopurinol 168m daily. Discussed possibly inciting flare - will notify me right away if gout flare sxs develop.      Diabetes type 2, controlled ([place] - Primary    Chronic. Is not regular with metformin. Check A1c today. Pneumovax today. Foot exam today. Encouraged he schedule eye exam.      Relevant Orders   Hemoglobin AE2V  Basic metabolic panel    Other Visit Diagnoses    Need for influenza vaccination        Relevant Orders    Flu Vaccine QUAD 36+ mos PF IM (Fluarix & Fluzone Quad PF) (Completed)        Follow up plan: Return in about 6 months (around 01/24/2016), or as needed, for annual exam, prior fasting for blood work.

## 2015-07-26 NOTE — Addendum Note (Signed)
Addended by: Royann Shivers A on: 07/26/2015 12:35 PM   Modules accepted: Orders

## 2015-07-26 NOTE — Progress Notes (Signed)
Pre visit review using our clinic review tool, if applicable. No additional management support is needed unless otherwise documented below in the visit note. 

## 2015-07-26 NOTE — Assessment & Plan Note (Addendum)
Due for thyroid ultrasound (last 2014) - never ordered 12/2014. Will reorder today. TFTs stable.

## 2015-07-26 NOTE — Assessment & Plan Note (Signed)
Body mass index is 41.02 kg/(m^2).

## 2015-07-26 NOTE — Assessment & Plan Note (Signed)
Discussed indications for urate lowering medication. Pt interested in staving off future flare. Will start allopurinol 100mg  daily. Discussed possibly inciting flare - will notify me right away if gout flare sxs develop.

## 2015-07-26 NOTE — Addendum Note (Signed)
Addended by: Ria Bush on: 07/26/2015 12:29 PM   Modules accepted: Orders

## 2015-07-28 ENCOUNTER — Encounter: Payer: Self-pay | Admitting: *Deleted

## 2015-07-28 ENCOUNTER — Ambulatory Visit
Admission: RE | Admit: 2015-07-28 | Discharge: 2015-07-28 | Disposition: A | Discharge status: Home / Self Care | Payer: 59 | Source: Ambulatory Visit | Attending: Family Medicine | Admitting: Family Medicine

## 2015-07-28 DIAGNOSIS — E041 Nontoxic single thyroid nodule: Secondary | ICD-10-CM

## 2015-07-31 ENCOUNTER — Encounter: Payer: Self-pay | Admitting: *Deleted

## 2015-09-13 ENCOUNTER — Telehealth: Payer: Self-pay

## 2015-09-13 MED ORDER — COLCHICINE 0.6 MG PO TABS: 0.6000 mg | ORAL_TABLET | Freq: Two times a day (BID) | ORAL | Status: DC | PRN

## 2015-09-13 MED ORDER — IBUPROFEN 600 MG PO TABS: 600.0000 mg | ORAL_TABLET | Freq: Three times a day (TID) | ORAL | Status: DC | PRN

## 2015-09-13 NOTE — Telephone Encounter (Signed)
We started allopurinol 07/2015.  Sounds like he's having gout flare - rec start colchicine 1 tab BID PRN pain and ibuprofen 600mg  TID PRN pain - take with food, sent to pharmacy. May stop both when sxs resolve. F/u next week if no better, if worsening rec eval at urgent care over weekend.  Lab Results  Component Value Date   CREATININE 0.81 07/26/2015

## 2015-09-13 NOTE — Telephone Encounter (Signed)
Patient notified and verbalized understanding. 

## 2015-09-13 NOTE — Telephone Encounter (Signed)
PLEASE NOTE: All timestamps contained within this report are represented as [place] Standard Time. CONFIDENTIALTY NOTICE: This fax transmission is intended only for the addressee. It contains information that is legally privileged, confidential or otherwise protected from use or disclosure. If you are not the intended recipient, you are strictly prohibited from reviewing, disclosing, copying using or disseminating any of this information or taking any action in reliance on or regarding this information. If you have received this fax in error, please notify us immediately by telephone so that we can arrange for its return to [place]. Phone: [phone], Toll-Free: [phone], Fax: [phone] Page: 1 of 1 Call Id: [id number] [place] Patient Name: Jorge Boyle Gender: Male DOB: [birth year] Age: 39 Y 11 M 13 D Return Phone Number: [phone] (Primary), [phone] (Secondary) Address: City/State/Zip: [place] [facility] - Client Client Site [place] Physician Ria Bush Contact Type Call Call Type Triage / Clinical Caller Name [place] Relationship To Patient Mother Appointment Disposition EMR Appointment Not Necessary Info pasted into Epic No Return Phone Number [phone] (Secondary) Chief Complaint Unclassified Symptom Initial Comment Caller states son has gout and needs something called in. Current medication is not working. No Triage Reason Patient declined Nurse Assessment Nurse: Denyse Amass, RN, Benjamine Mola Date/Time (Eastern Time): 09/12/2015 5:35:47 PM Confirm and document reason for call. If symptomatic, describe symptoms. ---Patient states he is taking Allopurinol 100 mg once a day every day for Gout. States he has pain, redness, swelling on his left great toe for 2-3 days. Wants another medication called in  for his Gout. Has the patient traveled out of the country within the last 30 days? ---Not Applicable Does the patient have any new or worsening symptoms? ---Yes Will a triage be completed? ---No Select reason for no triage. ---Patient declined Guidelines Guideline Title Affirmed Question Affirmed Notes Nurse Date/Time (Eastern Time) Disp. Time Eilene Ghazi Time) Disposition Final User 09/12/2015 5:05:54 PM Attempt made - message left [place], RN, Benjamine Mola 09/12/2015 5:17:51 PM Attempt made - message left [place], RN, Benjamine Mola 09/12/2015 5:42:18 PM Clinical Call Yes [place], RN, Benjamine Mola After Care Instructions Given Call Event Type User Date / Time Description

## 2015-10-09 ENCOUNTER — Other Ambulatory Visit: Payer: Self-pay | Admitting: *Deleted

## 2015-10-09 MED ORDER — ALLOPURINOL 100 MG PO TABS: 100.0000 mg | ORAL_TABLET | Freq: Every day | ORAL | Status: DC

## 2015-10-18 ENCOUNTER — Telehealth: Payer: Self-pay | Admitting: Family Medicine

## 2015-10-18 NOTE — Telephone Encounter (Signed)
Dad came by requesting a letter for pt to state that he should be able to go to the bathroom as needed.  Right now, work is not allowing pt to go to the bathroom  cb number is [phone] Thank you

## 2015-10-19 NOTE — Telephone Encounter (Signed)
Letter written and in chart 

## 2015-10-20 NOTE — Telephone Encounter (Signed)
Spoke to pt and informed him letter is available for pickup from the front desk 

## 2015-12-23 ENCOUNTER — Other Ambulatory Visit: Payer: Self-pay | Admitting: Family Medicine

## 2016-01-16 ENCOUNTER — Other Ambulatory Visit: Payer: Self-pay | Admitting: Family Medicine

## 2016-01-16 DIAGNOSIS — E785 Hyperlipidemia, unspecified: Secondary | ICD-10-CM | POA: Insufficient documentation

## 2016-01-16 DIAGNOSIS — E041 Nontoxic single thyroid nodule: Secondary | ICD-10-CM

## 2016-01-16 DIAGNOSIS — E1169 Type 2 diabetes mellitus with other specified complication: Secondary | ICD-10-CM | POA: Insufficient documentation

## 2016-01-16 DIAGNOSIS — D72829 Elevated white blood cell count, unspecified: Secondary | ICD-10-CM

## 2016-01-16 DIAGNOSIS — E119 Type 2 diabetes mellitus without complications: Secondary | ICD-10-CM

## 2016-01-16 DIAGNOSIS — R030 Elevated blood-pressure reading, without diagnosis of hypertension: Secondary | ICD-10-CM

## 2016-01-16 DIAGNOSIS — IMO0001 Reserved for inherently not codable concepts without codable children: Secondary | ICD-10-CM

## 2016-01-16 DIAGNOSIS — M1000 Idiopathic gout, unspecified site: Secondary | ICD-10-CM

## 2016-01-17 ENCOUNTER — Other Ambulatory Visit (INDEPENDENT_AMBULATORY_CARE_PROVIDER_SITE_OTHER): Payer: 59

## 2016-01-17 DIAGNOSIS — D72829 Elevated white blood cell count, unspecified: Secondary | ICD-10-CM

## 2016-01-17 DIAGNOSIS — E119 Type 2 diabetes mellitus without complications: Secondary | ICD-10-CM | POA: Diagnosis not present

## 2016-01-17 DIAGNOSIS — E785 Hyperlipidemia, unspecified: Secondary | ICD-10-CM

## 2016-01-17 DIAGNOSIS — R030 Elevated blood-pressure reading, without diagnosis of hypertension: Secondary | ICD-10-CM | POA: Diagnosis not present

## 2016-01-17 DIAGNOSIS — M1000 Idiopathic gout, unspecified site: Secondary | ICD-10-CM

## 2016-01-17 DIAGNOSIS — E041 Nontoxic single thyroid nodule: Secondary | ICD-10-CM

## 2016-01-17 DIAGNOSIS — IMO0001 Reserved for inherently not codable concepts without codable children: Secondary | ICD-10-CM

## 2016-01-17 LAB — CBC WITH DIFFERENTIAL/PLATELET
BASOS ABS: 0 10*3/uL (ref 0.0–0.1)
BASOS PCT: 0.4 % (ref 0.0–3.0)
EOS ABS: 0.3 10*3/uL (ref 0.0–0.7)
Eosinophils Relative: 2.5 % (ref 0.0–5.0)
HEMATOCRIT: 41.6 % (ref 39.0–52.0)
Hemoglobin: 14.1 g/dL (ref 13.0–17.0)
LYMPHS ABS: 2.1 10*3/uL (ref 0.7–4.0)
LYMPHS PCT: 20.1 % (ref 12.0–46.0)
MCHC: 33.9 g/dL (ref 30.0–36.0)
MCV: 89 fl (ref 78.0–100.0)
MONOS PCT: 6.6 % (ref 3.0–12.0)
Monocytes Absolute: 0.7 10*3/uL (ref 0.1–1.0)
NEUTROS ABS: 7.3 10*3/uL (ref 1.4–7.7)
NEUTROS PCT: 70.4 % (ref 43.0–77.0)
PLATELETS: 277 10*3/uL (ref 150.0–400.0)
RBC: 4.68 Mil/uL (ref 4.22–5.81)
RDW: 13.8 % (ref 11.5–15.5)
WBC: 10.3 10*3/uL (ref 4.0–10.5)

## 2016-01-17 LAB — LIPID PANEL
Cholesterol: 156 mg/dL (ref 0–200)
HDL: 29.7 mg/dL — AB (ref >39.00)
LDL CALC: 111 mg/dL — AB (ref 0–99)
NONHDL: 126.06
Total CHOL/HDL Ratio: 5
Triglycerides: 77 mg/dL (ref 0.0–149.0)
VLDL: 15.4 mg/dL (ref 0.0–40.0)

## 2016-01-17 LAB — BASIC METABOLIC PANEL
BUN: 14 mg/dL (ref 6–23)
CALCIUM: 9.2 mg/dL (ref 8.4–10.5)
CHLORIDE: 106 meq/L (ref 96–112)
CO2: 25 mEq/L (ref 19–32)
CREATININE: 0.76 mg/dL (ref 0.40–1.50)
GFR: 120.55 mL/min (ref >60.00)
Glucose, Bld: 164 mg/dL — ABNORMAL HIGH (ref 70–99)
Potassium: 3.9 mEq/L (ref 3.5–5.1)
Sodium: 138 mEq/L (ref 135–145)

## 2016-01-17 LAB — MICROALBUMIN / CREATININE URINE RATIO
Creatinine,U: 102.5 mg/dL
Microalb Creat Ratio: 0.7 mg/g (ref 0.0–30.0)

## 2016-01-17 LAB — URIC ACID: URIC ACID, SERUM: 6.5 mg/dL (ref 4.0–7.8)

## 2016-01-17 LAB — TSH: TSH: 3.03 u[IU]/mL (ref 0.35–4.50)

## 2016-01-17 LAB — HEMOGLOBIN A1C: Hgb A1c MFr Bld: 6.9 % — ABNORMAL HIGH (ref 4.6–6.5)

## 2016-01-24 ENCOUNTER — Encounter: Payer: Self-pay | Admitting: Family Medicine

## 2016-01-24 ENCOUNTER — Ambulatory Visit (INDEPENDENT_AMBULATORY_CARE_PROVIDER_SITE_OTHER): Payer: 59 | Admitting: Family Medicine

## 2016-01-24 VITALS — BP 124/88 | HR 76 | Temp 97.7°F | Ht 70.5 in | Wt 285.0 lb

## 2016-01-24 DIAGNOSIS — Z0000 Encounter for general adult medical examination without abnormal findings: Secondary | ICD-10-CM

## 2016-01-24 DIAGNOSIS — M1000 Idiopathic gout, unspecified site: Secondary | ICD-10-CM

## 2016-01-24 DIAGNOSIS — E119 Type 2 diabetes mellitus without complications: Secondary | ICD-10-CM

## 2016-01-24 DIAGNOSIS — E785 Hyperlipidemia, unspecified: Secondary | ICD-10-CM

## 2016-01-24 NOTE — Assessment & Plan Note (Signed)
Preventative protocols reviewed and updated unless pt declined. Discussed healthy diet and lifestyle.  

## 2016-01-24 NOTE — Progress Notes (Signed)
Pre visit review using our clinic review tool, if applicable. No additional management support is needed unless otherwise documented below in the visit note. 

## 2016-01-24 NOTE — Assessment & Plan Note (Signed)
Currently getting over gout flare. Continue allopurinol 100mg  daily and colchicine prn.

## 2016-01-24 NOTE — Assessment & Plan Note (Signed)
Discussed with patient diet and lifestyle changes to improve readings. Reassess control at 6 mo f/u visit.

## 2016-01-24 NOTE — Assessment & Plan Note (Signed)
Discussed healthy diet and lifestyle changes to affect sustainable weight loss  

## 2016-01-24 NOTE — Assessment & Plan Note (Signed)
Schedule eye exam as due. Encouraged more regular mealtime regimen

## 2016-01-24 NOTE — Patient Instructions (Addendum)
Schedule eye exam as you're due.  Return in 6 months for follow up  Work on more regular mealtimes and exercise routine.   Health Maintenance, Male A healthy lifestyle and preventative care can promote health and wellness.  Maintain regular health, dental, and eye exams.  Eat a healthy diet. Foods like vegetables, fruits, whole grains, low-fat dairy products, and lean protein foods contain the nutrients you need and are low in calories. Decrease your intake of foods high in solid fats, added sugars, and salt. Get information about a proper diet from your health care provider, if necessary.  Regular physical exercise is one of the most important things you can do for your health. Most adults should get at least 150 minutes of moderate-intensity exercise (any activity that increases your heart rate and causes you to sweat) each week. In addition, most adults need muscle-strengthening exercises on 2 or more days a week.   Maintain a healthy weight. The body mass index (BMI) is a screening tool to identify possible weight problems. It provides an estimate of body fat based on height and weight. Your health care provider can find your BMI and can help you achieve or maintain a healthy weight. For males 20 years and older:  A BMI below 18.5 is considered underweight.  A BMI of 18.5 to 24.9 is normal.  A BMI of 25 to 29.9 is considered overweight.  A BMI of 30 and above is considered obese.  Maintain normal blood lipids and cholesterol by exercising and minimizing your intake of saturated fat. Eat a balanced diet with plenty of fruits and vegetables. Blood tests for lipids and cholesterol should begin at age 31 and be repeated every 5 years. If your lipid or cholesterol levels are high, you are over age 28, or you are at high risk for heart disease, you may need your cholesterol levels checked more frequently.Ongoing high lipid and cholesterol levels should be treated with medicines if diet and  exercise are not working.  If you smoke, find out from your health care provider how to quit. If you do not use tobacco, do not start.  Lung cancer screening is recommended for adults aged 19-80 years who are at high risk for developing lung cancer because of a history of smoking. A yearly low-dose CT scan of the lungs is recommended for people who have at least a 30-pack-year history of smoking and are current smokers or have quit within the past 15 years. A pack year of smoking is smoking an average of 1 pack of cigarettes a day for 1 year (for example, a 30-pack-year history of smoking could mean smoking 1 pack a day for 30 years or 2 packs a day for 15 years). Yearly screening should continue until the smoker has stopped smoking for at least 15 years. Yearly screening should be stopped for people who develop a health problem that would prevent them from having lung cancer treatment.  If you choose to drink alcohol, do not have more than 2 drinks per day. One drink is considered to be 12 oz (360 mL) of beer, 5 oz (150 mL) of wine, or 1.5 oz (45 mL) of liquor.  Avoid the use of street drugs. Do not share needles with anyone. Ask for help if you need support or instructions about stopping the use of drugs.  High blood pressure causes heart disease and increases the risk of stroke. High blood pressure is more likely to develop in:  People who have blood  pressure in the end of the normal range (100-139/85-89 mm Hg).  People who are overweight or obese.  People who are African American.  If you are 21-25 years of age, have your blood pressure checked every 3-5 years. If you are 16 years of age or older, have your blood pressure checked every year. You should have your blood pressure measured twice--once when you are at a hospital or clinic, and once when you are not at a hospital or clinic. Record the average of the two measurements. To check your blood pressure when you are not at a hospital or  clinic, you can use:  An automated blood pressure machine at a pharmacy.  A home blood pressure monitor.  If you are 35-43 years old, ask your health care provider if you should take aspirin to prevent heart disease.  Diabetes screening involves taking a blood sample to check your fasting blood sugar level. This should be done once every 3 years after age 65 if you are at a normal weight and without risk factors for diabetes. Testing should be considered at a younger age or be carried out more frequently if you are overweight and have at least 1 risk factor for diabetes.  Colorectal cancer can be detected and often prevented. Most routine colorectal cancer screening begins at the age of 34 and continues through age 24. However, your health care provider may recommend screening at an earlier age if you have risk factors for colon cancer. On a yearly basis, your health care provider may provide home test kits to check for hidden blood in the stool. A small camera at the end of a tube may be used to directly examine the colon (sigmoidoscopy or colonoscopy) to detect the earliest forms of colorectal cancer. Talk to your health care provider about this at age 42 when routine screening begins. A direct exam of the colon should be repeated every 5-10 years through age 60, unless early forms of precancerous polyps or small growths are found.  People who are at an increased risk for hepatitis B should be screened for this virus. You are considered at high risk for hepatitis B if:  You were born in a country where hepatitis B occurs often. Talk with your health care provider about which countries are considered high risk.  Your parents were born in a high-risk country and you have not received a shot to protect against hepatitis B (hepatitis B vaccine).  You have HIV or AIDS.  You use needles to inject street drugs.  You live with, or have sex with, someone who has hepatitis B.  You are a man who has  sex with other men (MSM).  You get hemodialysis treatment.  You take certain medicines for conditions like cancer, organ transplantation, and autoimmune conditions.  Hepatitis C blood testing is recommended for all people born from [birth year] through 1966 and any individual with known risk factors for hepatitis C.  Healthy men should no longer receive prostate-specific antigen (PSA) blood tests as part of routine cancer screening. Talk to your health care provider about prostate cancer screening.  Testicular cancer screening is not recommended for adolescents or adult males who have no symptoms. Screening includes self-exam, a health care provider exam, and other screening tests. Consult with your health care provider about any symptoms you have or any concerns you have about testicular cancer.  Practice safe sex. Use condoms and avoid high-risk sexual practices to reduce the spread of sexually transmitted infections (STIs).  You should be screened for STIs, including gonorrhea and chlamydia if:  You are sexually active and are younger than 24 years.  You are older than 24 years, and your health care provider tells you that you are at risk for this type of infection.  Your sexual activity has changed since you were last screened, and you are at an increased risk for chlamydia or gonorrhea. Ask your health care provider if you are at risk.  If you are at risk of being infected with HIV, it is recommended that you take a prescription medicine daily to prevent HIV infection. This is called pre-exposure prophylaxis (PrEP). You are considered at risk if:  You are a man who has sex with other men (MSM).  You are a heterosexual man who is sexually active with multiple partners.  You take drugs by injection.  You are sexually active with a partner who has HIV.  Talk with your health care provider about whether you are at high risk of being infected with HIV. If you choose to begin PrEP, you should  first be tested for HIV. You should then be tested every 3 months for as long as you are taking PrEP.  Use sunscreen. Apply sunscreen liberally and repeatedly throughout the day. You should seek shade when your shadow is shorter than you. Protect yourself by wearing long sleeves, pants, a wide-brimmed hat, and sunglasses year round whenever you are outdoors.  Tell your health care provider of new moles or changes in moles, especially if there is a change in shape or color. Also, tell your health care provider if a mole is larger than the size of a pencil eraser.  A one-time screening for abdominal aortic aneurysm (AAA) and surgical repair of large AAAs by ultrasound is recommended for men aged 41-75 years who are current or former smokers.  Stay current with your vaccines (immunizations).   This information is not intended to replace advice given to you by your health care provider. Make sure you discuss any questions you have with your health care provider.   Document Released: 04/04/2008 Document Revised: 10/28/2014 Document Reviewed: 03/04/2011 Elsevier Interactive Patient Education [employer].

## 2016-01-24 NOTE — Progress Notes (Signed)
BP 124/88 mmHg  Pulse 76  Temp(Src) 97.7 F (36.5 C) (Oral)  Ht 5' 10.5" (1.791 m)  Wt 285 lb (129.275 kg)  BMI 40.30 kg/m2  SpO2 98%   CC: CPE  Subjective:    Patient ID: Jorge Boyle, male    DOB: 28-Jul-1976, 40 y.o.   MRN: [id number]  HPI: Jorge Boyle is a 40 y.o. male presenting on 01/24/2016 for Annual Exam   H/o R thyroidectomy for adenoma. H/o L thyroid nodules s/p stable biopsy 2013, stable on rpt [place] 2014. Needs form filled for work. Diabetes - controlled on metformin. No recent eye exam.  Longstanding L vision trouble.   Brings work forms that need to be filled out today.  Diabetic Foot Exam - Simple   Simple Foot Form  Diabetic Foot exam was performed with the following findings:  Yes 01/24/2016  9:17 AM  Visual Inspection  No deformities, no ulcerations, no other skin breakdown bilaterally:  Yes  Sensation Testing  Intact to touch and monofilament testing bilaterally:  Yes  Pulse Check  Posterior Tibialis and Dorsalis pulse intact bilaterally:  Yes  Comments      Preventative: Flu - yearly Tdap 12/2011  Pneumovax 2016 Seat belt use discussed. Sunscreen use discussed. Denies changing moles on skin.   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 Interim medical history since our last visit reviewed. Allergies and medications reviewed and updated. Current Outpatient Prescriptions on File Prior to Visit  Medication Sig  . allopurinol (ZYLOPRIM) 100 MG tablet Take 1 tablet (100 mg total) by mouth daily.  . Blood Glucose Monitoring Suppl (ONE TOUCH ULTRA SYSTEM KIT) W/DEVICE KIT Use as directed  . colchicine 0.6 MG tablet Take 1 tablet (0.6 mg total) by mouth 2 (two) times daily as needed  (gout flare).  . fluticasone (FLONASE) 50 MCG/ACT nasal spray Place 2 sprays into both nostrils as needed.  . metFORMIN (GLUCOPHAGE) 500 MG tablet TAKE 1 TABLET (500 MG TOTAL) BY MOUTH AT BEDTIME.  . ONE TOUCH ULTRA TEST test strip 250.00 CHECK SUGARS AS NEEDED.   No current facility-administered medications on file prior to visit.    Review of Systems  Constitutional: Negative for fever, chills, activity change, appetite change, fatigue and unexpected weight change.  HENT: Negative for hearing loss.   Eyes: Negative for visual disturbance.  Respiratory: Negative for cough, chest tightness, shortness of breath and wheezing.   Cardiovascular: Negative for chest pain, palpitations and leg swelling.  Gastrointestinal: Negative for nausea, vomiting, abdominal pain, diarrhea, constipation, blood in stool and abdominal distention.  Genitourinary: Negative for hematuria and difficulty urinating.  Musculoskeletal: Negative for myalgias, arthralgias and neck pain.  Skin: Negative for rash.  Neurological: Negative for dizziness, seizures, syncope and headaches.  Hematological: Negative for adenopathy. Does not bruise/bleed easily.  Psychiatric/Behavioral: Negative for dysphoric mood. The patient is not nervous/anxious.    Per HPI unless specifically indicated in ROS section     Objective:    BP 124/88 mmHg  Pulse 76  Temp(Src) 97.7 F (36.5 C) (Oral)  Ht 5' 10.5" (1.791 m)  Wt 285 lb (129.275 kg)  BMI 40.30 kg/m2  SpO2 98%  Wt Readings from Last 3 Encounters:  01/24/16 285 lb (129.275 kg)  07/26/15 281 lb 12  oz (127.801 kg)  05/26/15 286 lb 4 oz (129.842 kg)    Physical Exam  Constitutional: He is oriented to person, place, and time. He appears well-developed and well-nourished. No distress.  HENT:  Head: Normocephalic and atraumatic.  Right Ear: Hearing, tympanic membrane, external ear and ear canal normal.  Left Ear: Hearing, tympanic membrane, external ear and ear canal normal.    Nose: Nose normal.  Mouth/Throat: Uvula is midline, oropharynx is clear and moist and mucous membranes are normal. No oropharyngeal exudate, posterior oropharyngeal edema or posterior oropharyngeal erythema.  Eyes: Conjunctivae and EOM are normal. Pupils are equal, round, and reactive to light. No scleral icterus.  Neck: Normal range of motion. Neck supple. No thyromegaly present.  Cardiovascular: Normal rate, regular rhythm, normal heart sounds and intact distal pulses.   No murmur heard. Pulses:      Radial pulses are 2+ on the right side, and 2+ on the left side.  Pulmonary/Chest: Effort normal and breath sounds normal. No respiratory distress. He has no wheezes. He has no rales.  Abdominal: Soft. Bowel sounds are normal. He exhibits no distension and no mass. There is no tenderness. There is no rebound and no guarding.  Musculoskeletal: Normal range of motion. He exhibits no edema.  See HPI for foot exam if done  Lymphadenopathy:    He has no cervical adenopathy.  Neurological: He is alert and oriented to person, place, and time.  CN grossly intact, station and gait intact  Skin: Skin is warm and dry. No rash noted.  Psychiatric: He has a normal mood and affect. His behavior is normal. Judgment and thought content normal.  Nursing note and vitals reviewed.  Results for orders placed or performed in visit on 01/17/16  Lipid panel  Result Value Ref Range   Cholesterol 156 0 - 200 mg/dL   Triglycerides 77.0 0.0 - 149.0 mg/dL   HDL 29.70 (L) >39.00 mg/dL   VLDL 15.4 0.0 - 40.0 mg/dL   LDL Cholesterol 111 (H) 0 - 99 mg/dL   Total CHOL/HDL Ratio 5    NonHDL 126.06   TSH  Result Value Ref Range   TSH 3.03 0.35 - 4.50 uIU/mL  Hemoglobin A1c  Result Value Ref Range   Hgb A1c MFr Bld 6.9 (H) 4.6 - 6.5 %  CBC with Differential/Platelet  Result Value Ref Range   WBC 10.3 4.0 - 10.5 K/uL   RBC 4.68 4.22 - 5.81 Mil/uL   Hemoglobin 14.1 13.0 - 17.0 g/dL   HCT 41.6 39.0 - 52.0 %   MCV  89.0 78.0 - 100.0 fl   MCHC 33.9 30.0 - 36.0 g/dL   RDW 13.8 11.5 - 15.5 %   Platelets 277.0 150.0 - 400.0 K/uL   Neutrophils Relative % 70.4 43.0 - 77.0 %   Lymphocytes Relative 20.1 12.0 - 46.0 %   Monocytes Relative 6.6 3.0 - 12.0 %   Eosinophils Relative 2.5 0.0 - 5.0 %   Basophils Relative 0.4 0.0 - 3.0 %   Neutro Abs 7.3 1.4 - 7.7 K/uL   Lymphs Abs 2.1 0.7 - 4.0 K/uL   Monocytes Absolute 0.7 0.1 - 1.0 K/uL   Eosinophils Absolute 0.3 0.0 - 0.7 K/uL   Basophils Absolute 0.0 0.0 - 0.1 K/uL  Basic metabolic panel  Result Value Ref Range   Sodium 138 135 - 145 mEq/L   Potassium 3.9 3.5 - 5.1 mEq/L   Chloride 106 96 - 112 mEq/L   CO2 25 19 - 32  mEq/L   Glucose, Bld 164 (H) 70 - 99 mg/dL   BUN 14 6 - 23 mg/dL   Creatinine, Ser 0.76 0.40 - 1.50 mg/dL   Calcium 9.2 8.4 - 10.5 mg/dL   GFR 120.55 >60.00 mL/min  Uric acid  Result Value Ref Range   Uric Acid, Serum 6.5 4.0 - 7.8 mg/dL  Microalbumin / creatinine urine ratio  Result Value Ref Range   Microalb, Ur <0.7 0.0 - 1.9 mg/dL   Creatinine,U 102.5 mg/dL   Microalb Creat Ratio 0.7 0.0 - 30.0 mg/g      Assessment & Plan:   Problem List Items Addressed This Visit    Obesity, morbid, BMI 40.0-49.9 (HCC)    Discussed healthy diet and lifestyle changes to affect sustainable weight loss      Diabetes type 2, controlled (HCC)    Schedule eye exam as due. Encouraged more regular mealtime regimen      Health maintenance examination - Primary    Preventative protocols reviewed and updated unless pt declined. Discussed healthy diet and lifestyle.       Gout    Currently getting over gout flare. Continue allopurinol 139m daily and colchicine prn.      Dyslipidemia    Discussed with patient diet and lifestyle changes to improve readings. Reassess control at 6 mo f/u visit.           Follow up plan: Return in about 6 months (around 07/25/2016), or as needed, for follow up visit.  JRia Bush MD

## 2016-02-05 ENCOUNTER — Encounter: Payer: Self-pay | Admitting: Family Medicine

## 2016-02-05 ENCOUNTER — Ambulatory Visit (INDEPENDENT_AMBULATORY_CARE_PROVIDER_SITE_OTHER): Payer: 59 | Admitting: Family Medicine

## 2016-02-05 VITALS — BP 130/82 | HR 84 | Temp 98.5°F | Ht 69.25 in | Wt 283.5 lb

## 2016-02-05 DIAGNOSIS — M545 Low back pain, unspecified: Secondary | ICD-10-CM | POA: Insufficient documentation

## 2016-02-05 MED ORDER — CYCLOBENZAPRINE HCL 10 MG PO TABS: 10.0000 mg | ORAL_TABLET | Freq: Three times a day (TID) | ORAL | Status: DC | PRN

## 2016-02-05 MED ORDER — PREDNISONE 10 MG (21) PO TBPK: ORAL_TABLET | ORAL | Status: DC

## 2016-02-05 NOTE — Assessment & Plan Note (Signed)
New acute problem. MSK in origin following heavy lifting. No indication for imaging at this time. Treating with Sterapred and Flexeril. Out of work today and tomorrow. May need extension.

## 2016-02-05 NOTE — Progress Notes (Signed)
Pre visit review using our clinic review tool, if applicable. No additional management support is needed unless otherwise documented below in the visit note. 

## 2016-02-05 NOTE — Patient Instructions (Signed)
Take the medications as prescribed.  Follow up closely with PCP.  Take care  Dr. Lacinda Axon

## 2016-02-05 NOTE — Progress Notes (Signed)
Subjective:  Patient ID: Jorge Boyle, male    DOB: 26-Dec-1975  Age: 40 y.o. MRN: [id number]  CC: Low back pain  HPI:  40 year old male presents for acute visit complaints of back pain.  Back pain  Patient states that he developed back pain yesterday after lifting up a heavy lawnmower.  Pain is located in the low back and is severe.  He reports associated difficulty with range of motion/bending as well as with ambulation.  He reports some mild pain in the legs bilaterally.  No medications or interventions tried.  Exacerbated by activity.  No known relieving factors.  No other complaints today.  Social Hx   Social History   Social History  . Marital Status: Single    Spouse Name: N/A  . Number of Children: N/A  . Years of Education: N/A   Social History Main Topics  . Smoking status: Never Smoker   . Smokeless tobacco: Never Used  . Alcohol Use: 0.0 oz/week    0 Standard drinks or equivalent per week     Comment: rare  . Drug Use: No  . Sexual Activity: Not Asked   Other Topics Concern  . None   Social History Narrative   Caffeine: 1-2 cups /day   Lives with twin brother, 2 dogs   Occupation: Journalist, newspaper at [employer]   Edu: HS   Activity: goes to gym 3x/wk   Diet: good water, fruits/vegetables some    Review of Systems  Constitutional: Negative.   Musculoskeletal: Positive for back pain.   Objective:  BP 130/82 mmHg  Pulse 84  Temp(Src) 98.5 F (36.9 C) (Oral)  Ht 5' 9.25" (1.759 m)  Wt 283 lb 8 oz (128.595 kg)  BMI 41.56 kg/m2  SpO2 94%  BP/Weight 02/05/2016 01/24/2016 [id number]  Systolic BP [id number]  Diastolic BP 82 88 84  Wt. (Lbs) 283.5 285 281.75  BMI 41.56 40.3 41.02    Physical Exam  Constitutional: He is oriented to person, place, and time. He appears well-developed. No distress.  Pulmonary/Chest: Effort normal.  Musculoskeletal:  Back Exam:  Inspection: Unremarkable  Decreased ROM in all planes. Antalgic  gait. Pain at the SI joints (particularly the left).   Neurological: He is alert and oriented to person, place, and time.  Normal LE muscle strength. 1+ Achilles and Patellar reflexes.   Psychiatric: He has a normal mood and affect.  Vitals reviewed.  Lab Results  Component Value Date   WBC 10.3 01/17/2016   HGB 14.1 01/17/2016   HCT 41.6 01/17/2016   PLT 277.0 01/17/2016   GLUCOSE 164* 01/17/2016   CHOL 156 01/17/2016   TRIG 77.0 01/17/2016   HDL 29.70* 01/17/2016   LDLDIRECT 126 05/21/2013   LDLCALC 111* 01/17/2016   ALT 39 01/04/2015   AST 29 01/04/2015   NA 138 01/17/2016   K 3.9 01/17/2016   CL 106 01/17/2016   CREATININE 0.76 01/17/2016   BUN 14 01/17/2016   CO2 25 01/17/2016   TSH 3.03 01/17/2016   HGBA1C 6.9* 01/17/2016   MICROALBUR <0.7 01/17/2016   Assessment & Plan:   Problem List Items Addressed This Visit    Low back pain - Primary    New acute problem. MSK in origin following heavy lifting. No indication for imaging at this time. Treating with Sterapred and Flexeril. Out of work today and tomorrow. May need extension.      Relevant Medications   predniSONE (STERAPRED UNI-PAK 21 TAB) 10 MG (21)  TBPK tablet   cyclobenzaprine (FLEXERIL) 10 MG tablet      Meds ordered this encounter  Medications  . predniSONE (STERAPRED UNI-PAK 21 TAB) 10 MG (21) TBPK tablet    Sig: Take per package instructions.    Dispense:  21 tablet    Refill:  0  . cyclobenzaprine (FLEXERIL) 10 MG tablet    Sig: Take 1 tablet (10 mg total) by mouth 3 (three) times daily as needed for muscle spasms.    Dispense:  30 tablet    Refill:  0   Follow-up: PRN  [place]

## 2016-02-12 ENCOUNTER — Encounter: Payer: Self-pay | Admitting: Family Medicine

## 2016-02-13 ENCOUNTER — Other Ambulatory Visit: Payer: Self-pay | Admitting: Family Medicine

## 2016-02-13 MED ORDER — NAPROXEN 500 MG PO TABS: ORAL_TABLET | ORAL | Status: DC

## 2016-06-07 ENCOUNTER — Other Ambulatory Visit: Payer: Self-pay | Admitting: Family Medicine

## 2016-07-26 ENCOUNTER — Ambulatory Visit (INDEPENDENT_AMBULATORY_CARE_PROVIDER_SITE_OTHER): Payer: 59 | Admitting: Family Medicine

## 2016-07-26 ENCOUNTER — Encounter: Payer: Self-pay | Admitting: Family Medicine

## 2016-07-26 VITALS — BP 118/80 | HR 72 | Temp 98.0°F | Wt 280.0 lb

## 2016-07-26 DIAGNOSIS — E119 Type 2 diabetes mellitus without complications: Secondary | ICD-10-CM

## 2016-07-26 DIAGNOSIS — Z23 Encounter for immunization: Secondary | ICD-10-CM

## 2016-07-26 DIAGNOSIS — E785 Hyperlipidemia, unspecified: Secondary | ICD-10-CM

## 2016-07-26 LAB — HEMOGLOBIN A1C
Hgb A1c MFr Bld: 6.1 % — ABNORMAL HIGH (ref <5.7)
MEAN PLASMA GLUCOSE: 128 mg/dL

## 2016-07-26 LAB — COMPREHENSIVE METABOLIC PANEL
ALK PHOS: 58 U/L (ref 40–115)
ALT: 19 U/L (ref 9–46)
AST: 17 U/L (ref 10–40)
Albumin: 3.8 g/dL (ref 3.6–5.1)
BUN: 12 mg/dL (ref 7–25)
CALCIUM: 8.9 mg/dL (ref 8.6–10.3)
CO2: 26 mmol/L (ref 20–31)
Chloride: 105 mmol/L (ref 98–110)
Creat: 0.83 mg/dL (ref 0.60–1.35)
Glucose, Bld: 86 mg/dL (ref 65–99)
POTASSIUM: 3.9 mmol/L (ref 3.5–5.3)
Sodium: 139 mmol/L (ref 135–146)
TOTAL PROTEIN: 6.9 g/dL (ref 6.1–8.1)
Total Bilirubin: 0.4 mg/dL (ref 0.2–1.2)

## 2016-07-26 LAB — LIPID PANEL
CHOL/HDL RATIO: 7.2 ratio — AB (ref <=5.0)
CHOLESTEROL: 187 mg/dL (ref 125–200)
HDL: 26 mg/dL — ABNORMAL LOW (ref >=40)
LDL Cholesterol: 133 mg/dL — ABNORMAL HIGH (ref <130)
Triglycerides: 139 mg/dL (ref <150)
VLDL: 28 mg/dL (ref <30)

## 2016-07-26 NOTE — Progress Notes (Signed)
Pre visit review using our clinic review tool, if applicable. No additional management support is needed unless otherwise documented below in the visit note. 

## 2016-07-26 NOTE — Assessment & Plan Note (Signed)
Discussed healthy diet and lifestyle changes to affect sustainable weight loss. Pt endorses healthy diet choices and more regular gym activity.

## 2016-07-26 NOTE — Progress Notes (Signed)
BP 118/80   Pulse 72   Temp 98 F (36.7 C) (Oral)   Wt 280 lb (127 kg)   BMI 41.05 kg/m    CC: 58mof/u visit Subjective:    Patient ID: RMalachi Boyle male    DOB: 129-Oct-1977 40y.o.   MRN: [id number] HPI: Jorge HOBBSis a 40y.o. male presenting on 07/26/2016 for Follow-up   DM - regularly does check sugars: 875earler today. Compliant with antihyperglycemic regimen which includes: metformin 5040mnightly. Denies low sugars or hypoglycemic symptoms. Denies paresthesias. Last diabetic eye exam DUE.  Pneumovax: 07/2015.  Prevnar: not due. Lab Results  Component Value Date   HGBA1C 6.9 (H) 01/17/2016   Diabetic Foot Exam - Simple   Simple Foot Form Diabetic Foot exam was performed with the following findings:  Yes 07/26/2016  5:01 PM  Visual Inspection No deformities, no ulcerations, no other skin breakdown bilaterally:  Yes Sensation Testing Intact to touch and monofilament testing bilaterally:  Yes Pulse Check Posterior Tibialis and Dorsalis pulse intact bilaterally:  Yes Comments     More regularly going to gym and healthier diet. Avoiding sodas, more water.   Relevant past medical, surgical, family and social history reviewed and updated as indicated. Interim medical history since our last visit reviewed. Allergies and medications reviewed and updated. Current Outpatient Prescriptions on File Prior to Visit  Medication Sig  . allopurinol (ZYLOPRIM) 100 MG tablet Take 1 tablet (100 mg total) by mouth daily.  . Blood Glucose Monitoring Suppl (ONE TOUCH ULTRA SYSTEM KIT) W/DEVICE KIT Use as directed  . colchicine 0.6 MG tablet Take 1 tablet (0.6 mg total) by mouth 2 (two) times daily as needed (gout flare).  . fluticasone (FLONASE) 50 MCG/ACT nasal spray PLACE 2 SPRAYS INTO BOTH NOSTRILS AS NEEDED.  . metFORMIN (GLUCOPHAGE) 500 MG tablet TAKE 1 TABLET (500 MG TOTAL) BY MOUTH AT BEDTIME.  . ONE TOUCH ULTRA TEST test strip 250.00 CHECK SUGARS AS NEEDED.   No  current facility-administered medications on file prior to visit.     Review of Systems Per HPI unless specifically indicated in ROS section     Objective:    BP 118/80   Pulse 72   Temp 98 F (36.7 C) (Oral)   Wt 280 lb (127 kg)   BMI 41.05 kg/m   Wt Readings from Last 3 Encounters:  07/26/16 280 lb (127 kg)  02/05/16 283 lb 8 oz (128.6 kg)  01/24/16 285 lb (129.3 kg)    Physical Exam  Constitutional: He appears well-developed and well-nourished. No distress.  HENT:  Head: Normocephalic and atraumatic.  Right Ear: External ear normal.  Left Ear: External ear normal.  Nose: Nose normal.  Mouth/Throat: Oropharynx is clear and moist. No oropharyngeal exudate.  Eyes: Conjunctivae and EOM are normal. Pupils are equal, round, and reactive to light. No scleral icterus.  Neck: Normal range of motion. Neck supple.  Cardiovascular: Normal rate, regular rhythm, normal heart sounds and intact distal pulses.   No murmur heard. Pulmonary/Chest: Effort normal and breath sounds normal. No respiratory distress. He has no wheezes. He has no rales.  Musculoskeletal: He exhibits no edema.  See HPI for foot exam if done  Lymphadenopathy:    He has no cervical adenopathy.  Skin: Skin is warm and dry. No rash noted.  Psychiatric: He has a normal mood and affect.  Nursing note and vitals reviewed.     Assessment & Plan:   Problem List  Items Addressed This Visit    Diabetes type 2, controlled ([place]) - Primary    Chronic, stable. Update A1c. Reports good recall cbg's. Encouraged to schedule eye exam as he's due      Relevant Orders   Hemoglobin A1c   Dyslipidemia    Update FLP - discussed possible statin if LDL remains >100.      Relevant Orders   Comprehensive metabolic panel   Lipid panel   Obesity, morbid, BMI 40.0-49.9 ([place])    Discussed healthy diet and lifestyle changes to affect sustainable weight loss. Pt endorses healthy diet choices and more regular gym activity.          Other Visit Diagnoses    Need for influenza vaccination       Relevant Orders   Flu Vaccine QUAD 36+ mos PF IM (Fluarix & Fluzone Quad PF) (Completed)       Follow up plan: Return in about 6 months (around 01/24/2017) for annual exam, prior fasting for blood work.  Ria Bush, MD

## 2016-07-26 NOTE — Patient Instructions (Addendum)
Flu shot today Labs today. You're doing well. Return as needed or in 6 months for physical.

## 2016-07-26 NOTE — Assessment & Plan Note (Addendum)
Chronic, stable. Update A1c. Reports good recall cbg's. Encouraged to schedule eye exam as he's due

## 2016-07-26 NOTE — Assessment & Plan Note (Signed)
Update FLP - discussed possible statin if LDL remains >100.

## 2016-11-29 ENCOUNTER — Telehealth: Payer: Self-pay | Admitting: Family Medicine

## 2016-11-29 NOTE — Telephone Encounter (Signed)
Message left for patient to drop off form for me to look at. We did labs in October 2017 that typically should cover what is required as long as it is in the correct timeframe.

## 2016-11-29 NOTE — Telephone Encounter (Signed)
Pt called to get appointment before 01/18/17 for biometric screening for work.  We can make him an appointment, however his insurance may not pay for this visit because it has not been 1 year and one day since his CPE last year.  We can take the ppw and have the PCP check to see if he can complete without office visit, but there may be a small fee for the ppw.  Patient loudly shouted he was not paying for this visit or paperwork and he also stated that we had previously completed labs that his insurance wouldn't pay "I'm is not paying for those either!" I explained that it might be helpful to call [employer] and ask if the CPE could be completed prior to 01/24/17 and he shouted he just needed to get a new doctor's office and hung up.

## 2016-12-07 ENCOUNTER — Ambulatory Visit (INDEPENDENT_AMBULATORY_CARE_PROVIDER_SITE_OTHER): Payer: 59 | Admitting: Adult Health

## 2016-12-07 ENCOUNTER — Encounter: Payer: Self-pay | Admitting: Adult Health

## 2016-12-07 VITALS — BP 120/80 | HR 84 | Temp 97.9°F | Resp 16 | Ht 69.25 in | Wt 284.0 lb

## 2016-12-07 DIAGNOSIS — J0100 Acute maxillary sinusitis, unspecified: Secondary | ICD-10-CM

## 2016-12-07 DIAGNOSIS — G8929 Other chronic pain: Secondary | ICD-10-CM

## 2016-12-07 DIAGNOSIS — M5441 Lumbago with sciatica, right side: Secondary | ICD-10-CM | POA: Diagnosis not present

## 2016-12-07 MED ORDER — CYCLOBENZAPRINE HCL 10 MG PO TABS: 10.0000 mg | ORAL_TABLET | Freq: Three times a day (TID) | ORAL | 0 refills | Status: DC | PRN

## 2016-12-07 MED ORDER — DOXYCYCLINE HYCLATE 100 MG PO CAPS: 100.0000 mg | ORAL_CAPSULE | Freq: Two times a day (BID) | ORAL | 0 refills | Status: DC

## 2016-12-07 MED ORDER — METHYLPREDNISOLONE ACETATE 80 MG/ML IJ SUSP
80.0000 mg | Freq: Once | INTRAMUSCULAR | Status: AC
  Administered 2016-12-07: 80 mg via INTRAMUSCULAR

## 2016-12-07 MED ORDER — METHYLPREDNISOLONE ACETATE 80 MG/ML IJ SUSP: 80.0000 mg | Freq: Once | INTRAMUSCULAR | Status: DC

## 2016-12-07 NOTE — Progress Notes (Signed)
Subjective:    Patient ID: Jorge Boyle, male    DOB: [birth year], 41 y.o.   MRN: [id number]  HPI  41 year old male who  has a past medical history of Allergic rhinitis; Diabetes mellitus without complication ([place]); History of chicken pox; Multiple thyroid nodules (01/2012); Nontoxic nodular goiter; Obesity; Prediabetes; and S/P excision of thyroid adenoma (2005). He [facility] today for the complaint of low back pain x 24 hours. This has been an ongoing issues with him for about 20 years. He reports that yesterday while at work he had pain across his lower back with radiating pain down his right lower leg. He reports that it feels as though there is numbness that spreads down his leg.   He denies any issues with bowel or bladder and has no saddle anesthesia.   Also complaining of sinusitis like symptoms for one month. Reports ": a lot of pain and pressure" in his sinus cavity. Denies any fevers, ear pain, or cough.   Review of Systems  Constitutional: Positive for activity change.  HENT: Positive for congestion, postnasal drip, rhinorrhea, sinus pain and sinus pressure. Negative for ear pain.   Respiratory: Negative.   Cardiovascular: Negative.   Genitourinary: Negative.   Musculoskeletal: Positive for arthralgias and back pain. Negative for gait problem, joint swelling, myalgias, neck pain and neck stiffness.  Neurological: Negative.   All other systems reviewed and are negative.  Past Medical History:  Diagnosis Date  . Allergic rhinitis   . Diabetes mellitus without complication ([place])   . History of chicken pox   . Multiple thyroid nodules 01/2012   left, biopsy x2 benign, rpt [place] stable (01/2013, 07/2015) rec rpt 1 yr  . Nontoxic nodular goiter   . Obesity   . Prediabetes   . S/P excision of thyroid adenoma 2005   R thyroidectomy    Social History   Social History  . Marital status: Single    Spouse name: N/A  . Number of children: N/A  . Years of  education: N/A   Occupational History  . Not on file.   Social History Main Topics  . Smoking status: Never Smoker  . Smokeless tobacco: Never Used  . Alcohol use 0.0 oz/week     Comment: rare  . Drug use: No  . Sexual activity: Not on file   Other Topics Concern  . Not on file   Social History Narrative   Caffeine: 1-2 cups /day   Lives with twin brother, 2 dogs   Occupation: Journalist, newspaper at [employer]   Edu: HS   Activity: goes to gym 3x/wk   Diet: good water, fruits/vegetables some     Past Surgical History:  Procedure Laterality Date  . HERNIA REPAIR  3/82/50   umbilical hernia  . THYROIDECTOMY, PARTIAL  2005   Right and isthmus  . TONSILLECTOMY AND ADENOIDECTOMY  1985  . UMBILICAL HERNIA REPAIR  2015   [place]    Family History  Problem Relation Age of Onset  . Diabetes Mother   . Diabetes Father   . Stroke Maternal Grandmother   . Diabetes Maternal Grandfather   . Diabetes Paternal Grandmother   . Cancer Paternal Grandmother 49    breast  . Coronary artery disease Neg Hx   . Cancer Other     stomach, lung (smoker)    Allergies  Allergen Reactions  . Penicillins Rash    Current Outpatient Prescriptions on File Prior to Visit  Medication Sig Dispense Refill  . allopurinol (ZYLOPRIM) 100 MG tablet Take 1 tablet (100 mg total) by mouth daily. 90 tablet 1  . Blood Glucose Monitoring Suppl (ONE TOUCH ULTRA SYSTEM KIT) W/DEVICE KIT Use as directed 1 each 0  . colchicine 0.6 MG tablet Take 1 tablet (0.6 mg total) by mouth 2 (two) times daily as needed (gout flare). 30 tablet 0  . metFORMIN (GLUCOPHAGE) 500 MG tablet TAKE 1 TABLET (500 MG TOTAL) BY MOUTH AT BEDTIME. 90 tablet 3  . ONE TOUCH ULTRA TEST test strip 250.00 CHECK SUGARS AS NEEDED. 100 each 3  . fluticasone (FLONASE) 50 MCG/ACT nasal spray PLACE 2 SPRAYS INTO BOTH NOSTRILS AS NEEDED. (Patient not taking: Reported on 12/07/2016) 16 g 3   No current facility-administered medications  on file prior to visit.     BP 120/80 (BP Location: Left Arm, Patient Position: Sitting, Cuff Size: Large)   Pulse 84   Temp 97.9 F (36.6 C) (Oral)   Resp 16   Ht 5' 9.25" (1.759 m)   Wt 284 lb (128.8 kg)   SpO2 98%   BMI 41.64 kg/m       Objective:   Physical Exam  Constitutional: He is oriented to person, place, and time. He appears well-developed and well-nourished. No distress.  HENT:  Head: Normocephalic and atraumatic.  Right Ear: Hearing, tympanic membrane, external ear and ear canal normal.  Left Ear: Hearing, tympanic membrane, external ear and ear canal normal.  Nose: Mucosal edema and rhinorrhea present. Right sinus exhibits maxillary sinus tenderness. Right sinus exhibits no frontal sinus tenderness. Left sinus exhibits maxillary sinus tenderness. Left sinus exhibits no frontal sinus tenderness.  Mouth/Throat: Uvula is midline, oropharynx is clear and moist and mucous membranes are normal. No oropharyngeal exudate.  Eyes: Conjunctivae and EOM are normal. Pupils are equal, round, and reactive to light. Right eye exhibits no discharge. Left eye exhibits no discharge. No scleral icterus.  Cardiovascular: Normal rate, regular rhythm and intact distal pulses.  Exam reveals no gallop and no friction rub.   No murmur heard. Pulmonary/Chest: Effort normal and breath sounds normal.  Musculoskeletal: Normal range of motion. He exhibits tenderness (bilateral lower back ). He exhibits no edema or deformity.  Neurological: He is alert and oriented to person, place, and time. He has normal reflexes.  Skin: Skin is warm and dry. No rash noted. No erythema. No pallor.  Psychiatric: He has a normal mood and affect. His behavior is normal. Judgment and thought content normal.  Nursing note and vitals reviewed.      Assessment & Plan:  1. Chronic bilateral low back pain with right-sided sciatica - likely pinched nerve or slipped disk?Marland Kitchen He needs to follow up with PCP for imaging as  this has been a chronic issue.  - cyclobenzaprine (FLEXERIL) 10 MG tablet; Take 1 tablet (10 mg total) by mouth 3 (three) times daily as needed for muscle spasms.  Dispense: 30 tablet; Refill: 0 - methylPREDNISolone acetate (DEPO-MEDROL) injection 80 mg; Inject 1 mL (80 mg total) into the muscle once. - can take tylenol or motrin   2. Acute maxillary sinusitis, recurrence not specified  - doxycycline (VIBRAMYCIN) 100 MG capsule; Take 1 capsule (100 mg total) by mouth 2 (two) times daily.  Dispense: 14 capsule; Refill: 0 - use flonase  Dorothyann Peng, NP

## 2016-12-07 NOTE — Progress Notes (Signed)
Pre visit review using our clinic review tool, if applicable. No additional management support is needed unless otherwise documented below in the visit note.,  

## 2016-12-09 ENCOUNTER — Ambulatory Visit
Admission: RE | Admit: 2016-12-09 | Discharge: 2016-12-09 | Disposition: A | Discharge status: Home / Self Care | Payer: 59 | Source: Ambulatory Visit | Attending: Family Medicine | Admitting: Family Medicine

## 2016-12-09 ENCOUNTER — Encounter: Payer: Self-pay | Admitting: Family Medicine

## 2016-12-09 ENCOUNTER — Ambulatory Visit (INDEPENDENT_AMBULATORY_CARE_PROVIDER_SITE_OTHER): Payer: 59 | Admitting: Family Medicine

## 2016-12-09 VITALS — BP 144/90 | HR 92 | Temp 98.3°F | Wt 291.2 lb

## 2016-12-09 DIAGNOSIS — J018 Other acute sinusitis: Secondary | ICD-10-CM

## 2016-12-09 DIAGNOSIS — M545 Low back pain: Secondary | ICD-10-CM | POA: Diagnosis present

## 2016-12-09 DIAGNOSIS — M47896 Other spondylosis, lumbar region: Secondary | ICD-10-CM | POA: Diagnosis not present

## 2016-12-09 DIAGNOSIS — M5440 Lumbago with sciatica, unspecified side: Secondary | ICD-10-CM

## 2016-12-09 DIAGNOSIS — R29890 Loss of height: Secondary | ICD-10-CM | POA: Insufficient documentation

## 2016-12-09 DIAGNOSIS — M47897 Other spondylosis, lumbosacral region: Secondary | ICD-10-CM | POA: Insufficient documentation

## 2016-12-09 DIAGNOSIS — J019 Acute sinusitis, unspecified: Secondary | ICD-10-CM | POA: Insufficient documentation

## 2016-12-09 MED ORDER — PREDNISONE 10 MG (21) PO TBPK: ORAL_TABLET | ORAL | 0 refills | Status: DC

## 2016-12-09 NOTE — Patient Instructions (Addendum)
Nice to meet you.  Please stop by to see [name] on your way out.  We will call you with your xray results. Please schedule a follow up appointment with Dr. Darnell Level.

## 2016-12-09 NOTE — Assessment & Plan Note (Signed)
Improving. Finish course of doxycycline. Follow up with PCP as needed.

## 2016-12-09 NOTE — Progress Notes (Signed)
Subjective:   Patient ID: Jorge Boyle, male    DOB: 09-07-1976, 41 y.o.   MRN: [id number]  Jorge Boyle is a pleasant 41 y.o. year old male pt of Dr. Darnell Level, new to me,  who presents to clinic today with Back Pain ("ongoing for years")  on 12/09/2016  HPI:  Was seen two days ago in weekend clinic for this complaint.  Note reviewed.  Given rx for flexeril as needed for spasms and IM depo medrol given. Advised to follow up with PCP for imaging as it is a chronic issue.m Pain is no better.  Was also treated with doxycyline for acute sinusitis.  Current Outpatient Prescriptions on File Prior to Visit  Medication Sig Dispense Refill  . allopurinol (ZYLOPRIM) 100 MG tablet Take 1 tablet (100 mg total) by mouth daily. 90 tablet 1  . Blood Glucose Monitoring Suppl (ONE TOUCH ULTRA SYSTEM KIT) W/DEVICE KIT Use as directed 1 each 0  . colchicine 0.6 MG tablet Take 1 tablet (0.6 mg total) by mouth 2 (two) times daily as needed (gout flare). 30 tablet 0  . cyclobenzaprine (FLEXERIL) 10 MG tablet Take 1 tablet (10 mg total) by mouth 3 (three) times daily as needed for muscle spasms. 30 tablet 0  . doxycycline (VIBRAMYCIN) 100 MG capsule Take 1 capsule (100 mg total) by mouth 2 (two) times daily. 14 capsule 0  . fluticasone (FLONASE) 50 MCG/ACT nasal spray PLACE 2 SPRAYS INTO BOTH NOSTRILS AS NEEDED. 16 g 3  . metFORMIN (GLUCOPHAGE) 500 MG tablet TAKE 1 TABLET (500 MG TOTAL) BY MOUTH AT BEDTIME. 90 tablet 3  . ONE TOUCH ULTRA TEST test strip 250.00 CHECK SUGARS AS NEEDED. 100 each 3   No current facility-administered medications on file prior to visit.     Allergies  Allergen Reactions  . Penicillins Rash    Past Medical History:  Diagnosis Date  . Allergic rhinitis   . Diabetes mellitus without complication ([place])   . History of chicken pox   . Multiple thyroid nodules 01/2012   left, biopsy x2 benign, rpt [place] stable (01/2013, 07/2015) rec rpt 1 yr  . Nontoxic nodular goiter   .  Obesity   . Prediabetes   . S/P excision of thyroid adenoma 2005   R thyroidectomy    Past Surgical History:  Procedure Laterality Date  . HERNIA REPAIR  7/90/24   umbilical hernia  . THYROIDECTOMY, PARTIAL  2005   Right and isthmus  . TONSILLECTOMY AND ADENOIDECTOMY  1985  . UMBILICAL HERNIA REPAIR  2015   [place]    Family History  Problem Relation Age of Onset  . Diabetes Mother   . Diabetes Father   . Stroke Maternal Grandmother   . Diabetes Maternal Grandfather   . Diabetes Paternal Grandmother   . Cancer Paternal Grandmother 48    breast  . Coronary artery disease Neg Hx   . Cancer Other     stomach, lung (smoker)    Social History   Social History  . Marital status: Single    Spouse name: N/A  . Number of children: N/A  . Years of education: N/A   Occupational History  . Not on file.   Social History Main Topics  . Smoking status: Never Smoker  . Smokeless tobacco: Never Used  . Alcohol use 0.0 oz/week     Comment: rare  . Drug use: No  . Sexual activity: Not on file   Other Topics Concern  .  Not on file   Social History Narrative   Caffeine: 1-2 cups /day   Lives with twin brother, 2 dogs   Occupation: Journalist, newspaper at [employer]   Edu: HS   Activity: goes to gym 3x/wk   Diet: good water, fruits/vegetables some    The PMH, PSH, Social History, Family History, Medications, and allergies have been reviewed in [facility], and have been updated if relevant.   Review of Systems  HENT: Positive for congestion and sinus pain.   Eyes: Negative.   Respiratory: Positive for cough.   Gastrointestinal: Negative.   Endocrine: Negative.   Genitourinary: Negative.   Musculoskeletal: Positive for back pain.  Neurological: Negative.   Hematological: Negative.   Psychiatric/Behavioral: Negative.   All other systems reviewed and are negative.      Objective:    BP (!) 144/90   Pulse 92   Temp 98.3 F (36.8 C) (Oral)   Wt 291 lb 4 oz  (132.1 kg)   SpO2 97%   BMI 42.70 kg/m    Physical Exam  Constitutional: He is oriented to person, place, and time. He appears well-developed and well-nourished. No distress.  HENT:  Head: Normocephalic and atraumatic.  Right Ear: Hearing and tympanic membrane normal.  Left Ear: Hearing and tympanic membrane normal.  Nose: Rhinorrhea present. Right sinus exhibits no maxillary sinus tenderness and no frontal sinus tenderness. Left sinus exhibits no maxillary sinus tenderness and no frontal sinus tenderness.  Eyes: Conjunctivae are normal.  Neck: Neck supple.  Cardiovascular: Normal rate and regular rhythm.   Pulmonary/Chest: Effort normal and breath sounds normal.  Musculoskeletal: He exhibits no edema.       Lumbar back: He exhibits tenderness, pain and spasm. He exhibits no bony tenderness, no swelling, no edema, no deformity, no laceration and normal pulse.  Neurological: He is alert and oriented to person, place, and time. No cranial nerve deficit.  Skin: Skin is warm and dry. He is not diaphoretic.  Psychiatric: He has a normal mood and affect. His behavior is normal. Judgment and thought content normal.  Nursing note and vitals reviewed.         Assessment & Plan:   Acute bilateral low back pain with sciatica, sciatica laterality unspecified  Acute non-recurrent sinusitis of other sinus No Follow-up on file.

## 2016-12-09 NOTE — Progress Notes (Signed)
Pre visit review using our clinic review tool, if applicable. No additional management support is needed unless otherwise documented below in the visit note. 

## 2016-12-09 NOTE — Assessment & Plan Note (Addendum)
Chronic issue, acutely not improved. Given oral steroid dose pack. Note for work. XRay today. Follow up with PCP this week.

## 2016-12-11 LAB — HM DIABETES EYE EXAM

## 2017-01-01 ENCOUNTER — Other Ambulatory Visit: Payer: Self-pay | Admitting: Family Medicine

## 2017-01-02 ENCOUNTER — Ambulatory Visit (INDEPENDENT_AMBULATORY_CARE_PROVIDER_SITE_OTHER): Payer: 59 | Admitting: Family Medicine

## 2017-01-02 ENCOUNTER — Encounter: Payer: Self-pay | Admitting: Family Medicine

## 2017-01-02 VITALS — BP 124/90 | HR 80 | Temp 97.8°F | Wt 283.5 lb

## 2017-01-02 DIAGNOSIS — M5440 Lumbago with sciatica, unspecified side: Secondary | ICD-10-CM

## 2017-01-02 NOTE — Patient Instructions (Addendum)
Xray showed arthritis of back and possible bulging disc.  May use aleve 1 tab twice daily as needed with meals for back pain.  Exercises provided today.  Work on regular walking which will help back. Work on weight loss.  Let us know if worsening back pain, fevers/chills, or any weakness of legs develops.

## 2017-01-02 NOTE — Progress Notes (Signed)
Pre visit review using our clinic review tool, if applicable. No additional management support is needed unless otherwise documented below in the visit note. 

## 2017-01-02 NOTE — Assessment & Plan Note (Addendum)
Acute on chronic LBP with bilateral radiculopathy down lateral hips. This is largely improving with steroid course and time. However chronic issue. Discussed importance of regular walking regimen and weight loss to help control back pain.  Continue muscle relaxant, NSAID. Provided with exercises on lower back pain. Discussed indications for imaging with MRI - if we were to consider ESI/surgery.   Red flag indications to seek further care reviewed.

## 2017-01-02 NOTE — Progress Notes (Signed)
BP 124/90   Pulse 80   Temp 97.8 F (36.6 C) (Oral)   Wt 283 lb 8 oz (128.6 kg)   BMI 41.56 kg/m    CC: back pain Subjective:    Patient ID: Jorge Boyle, male    DOB: 1976/01/31, 41 y.o.   MRN: [id number]  HPI: Jorge Boyle is a 41 y.o. male presenting on 01/02/2017 for Follow-up (back pain)   Ongoing lower back pain worse over the past month. Seen at [facility] then by Dr Deborra Medina for same concern. Initially treated with flexeril and depo medrol IM. Then treated with oral steroid dose pack. This was more helpful. Flexeril was overly sedating. Lumbar xray showed spondylosis L4-S1 with mild loss of disc space height and facet arthrosis.   Lower back pain has improved. Ongoing bilateral paresthesias and numbness lateral hips. No leg weakness, bowel/bladder accidents, fevers. Denies inciting trauma/injury or falls.   Relevant past medical, surgical, family and social history reviewed and updated as indicated. Interim medical history since our last visit reviewed. Allergies and medications reviewed and updated. Outpatient Medications Prior to Visit  Medication Sig Dispense Refill  . allopurinol (ZYLOPRIM) 100 MG tablet Take 1 tablet (100 mg total) by mouth daily. 90 tablet 1  . Blood Glucose Monitoring Suppl (ONE TOUCH ULTRA SYSTEM KIT) W/DEVICE KIT Use as directed 1 each 0  . colchicine 0.6 MG tablet Take 1 tablet (0.6 mg total) by mouth 2 (two) times daily as needed (gout flare). 30 tablet 0  . fluticasone (FLONASE) 50 MCG/ACT nasal spray PLACE 2 SPRAYS INTO BOTH NOSTRILS AS NEEDED. 16 g 3  . glucose blood (ONE TOUCH ULTRA TEST) test strip Use to check sugars daily as needed Dx: E11.9 100 each 3  . metFORMIN (GLUCOPHAGE) 500 MG tablet TAKE 1 TABLET (500 MG TOTAL) BY MOUTH AT BEDTIME. 90 tablet 3  . cyclobenzaprine (FLEXERIL) 10 MG tablet Take 1 tablet (10 mg total) by mouth 3 (three) times daily as needed for muscle spasms. (Patient taking differently: Take 5 mg by mouth 2  (two) times daily as needed for muscle spasms. ) 30 tablet 0  . doxycycline (VIBRAMYCIN) 100 MG capsule Take 1 capsule (100 mg total) by mouth 2 (two) times daily. 14 capsule 0  . predniSONE (STERAPRED UNI-PAK 21 TAB) 10 MG (21) TBPK tablet Take per package instructions. 21 tablet 0   No facility-administered medications prior to visit.      Per HPI unless specifically indicated in ROS section below Review of Systems     Objective:    BP 124/90   Pulse 80   Temp 97.8 F (36.6 C) (Oral)   Wt 283 lb 8 oz (128.6 kg)   BMI 41.56 kg/m   Wt Readings from Last 3 Encounters:  01/02/17 283 lb 8 oz (128.6 kg)  12/09/16 291 lb 4 oz (132.1 kg)  12/07/16 284 lb (128.8 kg)    Physical Exam  Constitutional: He is oriented to person, place, and time. He appears well-developed and well-nourished. No distress.  Musculoskeletal: He exhibits no edema.  No significant pain midline spine Mild paraspinous mm tightness  Neg SLR bilaterally.  No pain with int/ext rotation at hip.  Neg FABER.   Neurological: He is alert and oriented to person, place, and time. He has normal strength. No sensory deficit.  5/5 strength BLE  Skin: Skin is warm and dry. No rash noted.  Psychiatric: He has a normal mood and affect.  Nursing note and vitals  reviewed.  Results for orders placed or performed in visit on 12/09/16  HM DIABETES EYE EXAM  Result Value Ref Range   HM Diabetic Eye Exam No Retinopathy No Retinopathy   Lab Results  Component Value Date   TSH 3.03 01/17/2016   LUMBAR SPINE - COMPLETE 4+ VIEW COMPARISON:  None. FINDINGS: Mild lumbar levocurvature with apex at L5. 5 lumbar type non-rib-bearing vertebral bodies. Straightening of lumbar lordosis. No listhesis. Vertebral body heights are preserved. Mild loss of height of the L4 through S1 intervertebral disc spaces. Mild lower lumbar facet arthrosis. IMPRESSION: 1. No acute fracture or dislocation of the lumbar spine. 2. Mild lumbar  levocurvature. 3. Lumbar spondylosis predominantly at L4 through S1 with there is mild loss of disc space height and facet arthrosis Electronically Signed   By: Kristine Garbe M.D.   On: 12/09/2016 14:39    Assessment & Plan:   Problem List Items Addressed This Visit    Low back pain    Acute on chronic LBP with bilateral radiculopathy down lateral hips. This is largely improving with steroid course and time. However chronic issue. Discussed importance of regular walking regimen and weight loss to help control back pain.  Continue muscle relaxant, NSAID. Provided with exercises on lower back pain. Discussed indications for imaging with MRI - if we were to consider ESI/surgery.   Red flag indications to seek further care reviewed.           Follow up plan: Return in about 3 months (around 04/04/2017).  Ria Bush, MD

## 2017-01-05 ENCOUNTER — Other Ambulatory Visit: Payer: Self-pay | Admitting: Family Medicine

## 2017-01-05 DIAGNOSIS — E785 Hyperlipidemia, unspecified: Secondary | ICD-10-CM

## 2017-01-05 DIAGNOSIS — E041 Nontoxic single thyroid nodule: Secondary | ICD-10-CM

## 2017-01-05 DIAGNOSIS — M1000 Idiopathic gout, unspecified site: Secondary | ICD-10-CM

## 2017-01-05 DIAGNOSIS — E119 Type 2 diabetes mellitus without complications: Secondary | ICD-10-CM

## 2017-01-07 ENCOUNTER — Other Ambulatory Visit (INDEPENDENT_AMBULATORY_CARE_PROVIDER_SITE_OTHER): Payer: 59

## 2017-01-07 DIAGNOSIS — M1000 Idiopathic gout, unspecified site: Secondary | ICD-10-CM

## 2017-01-07 DIAGNOSIS — E041 Nontoxic single thyroid nodule: Secondary | ICD-10-CM

## 2017-01-07 DIAGNOSIS — E785 Hyperlipidemia, unspecified: Secondary | ICD-10-CM

## 2017-01-07 DIAGNOSIS — E119 Type 2 diabetes mellitus without complications: Secondary | ICD-10-CM

## 2017-01-07 LAB — LIPID PANEL
CHOL/HDL RATIO: 5
Cholesterol: 171 mg/dL (ref 0–200)
HDL: 38 mg/dL — ABNORMAL LOW (ref >39.00)
LDL Cholesterol: 119 mg/dL — ABNORMAL HIGH (ref 0–99)
NonHDL: 133.14
TRIGLYCERIDES: 69 mg/dL (ref 0.0–149.0)
VLDL: 13.8 mg/dL (ref 0.0–40.0)

## 2017-01-07 LAB — TSH: TSH: 3.51 u[IU]/mL (ref 0.35–4.50)

## 2017-01-07 LAB — HEMOGLOBIN A1C: Hgb A1c MFr Bld: 6.9 % — ABNORMAL HIGH (ref 4.6–6.5)

## 2017-01-07 LAB — BASIC METABOLIC PANEL
BUN: 16 mg/dL (ref 6–23)
CALCIUM: 9.2 mg/dL (ref 8.4–10.5)
CO2: 28 meq/L (ref 19–32)
CREATININE: 0.93 mg/dL (ref 0.40–1.50)
Chloride: 104 mEq/L (ref 96–112)
GFR: 95.04 mL/min (ref >60.00)
Glucose, Bld: 108 mg/dL — ABNORMAL HIGH (ref 70–99)
Potassium: 3.8 mEq/L (ref 3.5–5.1)
SODIUM: 140 meq/L (ref 135–145)

## 2017-01-07 LAB — MICROALBUMIN / CREATININE URINE RATIO
CREATININE, U: 198.9 mg/dL
MICROALB UR: 1.6 mg/dL (ref 0.0–1.9)
MICROALB/CREAT RATIO: 0.8 mg/g (ref 0.0–30.0)

## 2017-01-07 LAB — URIC ACID: URIC ACID, SERUM: 8 mg/dL — AB (ref 4.0–7.8)

## 2017-01-14 ENCOUNTER — Encounter: Payer: Self-pay | Admitting: Family Medicine

## 2017-01-14 ENCOUNTER — Ambulatory Visit (INDEPENDENT_AMBULATORY_CARE_PROVIDER_SITE_OTHER): Payer: 59 | Admitting: Family Medicine

## 2017-01-14 VITALS — BP 130/88 | HR 72 | Temp 97.9°F | Ht 70.5 in | Wt 291.0 lb

## 2017-01-14 DIAGNOSIS — E119 Type 2 diabetes mellitus without complications: Secondary | ICD-10-CM

## 2017-01-14 DIAGNOSIS — E785 Hyperlipidemia, unspecified: Secondary | ICD-10-CM | POA: Diagnosis not present

## 2017-01-14 DIAGNOSIS — Z0000 Encounter for general adult medical examination without abnormal findings: Secondary | ICD-10-CM

## 2017-01-14 DIAGNOSIS — M1000 Idiopathic gout, unspecified site: Secondary | ICD-10-CM | POA: Diagnosis not present

## 2017-01-14 DIAGNOSIS — M5441 Lumbago with sciatica, right side: Secondary | ICD-10-CM | POA: Diagnosis not present

## 2017-01-14 DIAGNOSIS — G8929 Other chronic pain: Secondary | ICD-10-CM | POA: Diagnosis not present

## 2017-01-14 MED ORDER — FLUTICASONE PROPIONATE 50 MCG/ACT NA SUSP: 2.0000 | NASAL | 3 refills | Status: DC | PRN

## 2017-01-14 MED ORDER — CYCLOBENZAPRINE HCL 5 MG PO TABS: 5.0000 mg | ORAL_TABLET | Freq: Two times a day (BID) | ORAL | 1 refills | Status: DC | PRN

## 2017-01-14 MED ORDER — METFORMIN HCL 500 MG PO TABS: ORAL_TABLET | ORAL | 3 refills | Status: DC

## 2017-01-14 NOTE — Assessment & Plan Note (Signed)
Chronic, stable. Continue metformin 500mg  daily.

## 2017-01-14 NOTE — Patient Instructions (Addendum)
You are doing well today. Watch out for gout flare and let us know if this happens.  Get back to regular exercise routine.  Return as needed or in 5-6 months for diabetes follow up visit.    Health Maintenance, Male A healthy lifestyle and preventive care is important for your health and wellness. Ask your health care provider about what schedule of regular examinations is right for you. What should I know about weight and diet?  Eat a Healthy Diet  Eat plenty of vegetables, fruits, whole grains, low-fat dairy products, and lean protein.  Do not eat a lot of foods high in solid fats, added sugars, or salt. Maintain a Healthy Weight  Regular exercise can help you achieve or maintain a healthy weight. You should:  Do at least 150 minutes of exercise each week. The exercise should increase your heart rate and make you sweat (moderate-intensity exercise).  Do strength-training exercises at least twice a week. Watch Your Levels of Cholesterol and Blood Lipids  Have your blood tested for lipids and cholesterol every 5 years starting at 42 years of age. If you are at high risk for heart disease, you should start having your blood tested when you are 42 years old. You may need to have your cholesterol levels checked more often if:  Your lipid or cholesterol levels are high.  You are older than 43 years of age.  You are at high risk for heart disease. What should I know about cancer screening? Many types of cancers can be detected early and may often be prevented. Lung Cancer  You should be screened every year for lung cancer if:  You are a current smoker who has smoked for at least 30 years.  You are a former smoker who has quit within the past 15 years.  Talk to your health care provider about your screening options, when you should start screening, and how often you should be screened. Colorectal Cancer  Routine colorectal cancer screening usually begins at 42 years of age and  should be repeated every 5-10 years until you are 42 years old. You may need to be screened more often if early forms of precancerous polyps or small growths are found. Your health care provider may recommend screening at an earlier age if you have risk factors for colon cancer.  Your health care provider may recommend using home test kits to check for hidden blood in the stool.  A small camera at the end of a tube can be used to examine your colon (sigmoidoscopy or colonoscopy). This checks for the earliest forms of colorectal cancer. Prostate and Testicular Cancer  Depending on your age and overall health, your health care provider may do certain tests to screen for prostate and testicular cancer.  Talk to your health care provider about any symptoms or concerns you have about testicular or prostate cancer. Skin Cancer  Check your skin from head to toe regularly.  Tell your health care provider about any new moles or changes in moles, especially if:  There is a change in a mole's size, shape, or color.  You have a mole that is larger than a pencil eraser.  Always use sunscreen. Apply sunscreen liberally and repeat throughout the day.  Protect yourself by wearing long sleeves, pants, a wide-brimmed hat, and sunglasses when outside. What should I know about heart disease, diabetes, and high blood pressure?  If you are 54-19 years of age, have your blood pressure checked every 3-5 years.  If you are 80 years of age or older, have your blood pressure checked every year. You should have your blood pressure measured twice-once when you are at a hospital or clinic, and once when you are not at a hospital or clinic. Record the average of the two measurements. To check your blood pressure when you are not at a hospital or clinic, you can use:  An automated blood pressure machine at a pharmacy.  A home blood pressure monitor.  Talk to your health care provider about your target blood  pressure.  If you are between 50-73 years old, ask your health care provider if you should take aspirin to prevent heart disease.  Have regular diabetes screenings by checking your fasting blood sugar level.  If you are at a normal weight and have a low risk for diabetes, have this test once every three years after the age of 72.  If you are overweight and have a high risk for diabetes, consider being tested at a younger age or more often.  A one-time screening for abdominal aortic aneurysm (AAA) by ultrasound is recommended for men aged 66-75 years who are current or former smokers. What should I know about preventing infection? Hepatitis B  If you have a higher risk for hepatitis B, you should be screened for this virus. Talk with your health care provider to find out if you are at risk for hepatitis B infection. Hepatitis C  Blood testing is recommended for:  Everyone born from [birth year] through 1966.  Anyone with known risk factors for hepatitis C. Sexually Transmitted Diseases (STDs)  You should be screened each year for STDs including gonorrhea and chlamydia if:  You are sexually active and are younger than 42 years of age.  You are older than 42 years of age and your health care provider tells you that you are at risk for this type of infection.  Your sexual activity has changed since you were last screened and you are at an increased risk for chlamydia or gonorrhea. Ask your health care provider if you are at risk.  Talk with your health care provider about whether you are at high risk of being infected with HIV. Your health care provider may recommend a prescription medicine to help prevent HIV infection. What else can I do?  Schedule regular health, dental, and eye exams.  Stay current with your vaccines (immunizations).  Do not use any tobacco products, such as cigarettes, chewing tobacco, and e-cigarettes. If you need help quitting, ask your health care provider.  Limit  alcohol intake to no more than 2 drinks per day. One drink equals 12 ounces of beer, 5 ounces of wine, or 1 ounces of hard liquor.  Do not use street drugs.  Do not share needles.  Ask your health care provider for help if you need support or information about quitting drugs.  Tell your health care provider if you often feel depressed.  Tell your health care provider if you have ever been abused or do not feel safe at home. This information is not intended to replace advice given to you by your health care provider. Make sure you discuss any questions you have with your health care provider. Document Released: 04/04/2008 Document Revised: 06/05/2016 Document Reviewed: 07/11/2015 Elsevier Interactive Patient Education  2017 [employer].

## 2017-01-14 NOTE — Assessment & Plan Note (Signed)
Improved with increased walking. Rare PRN flexeril. Refilled today.

## 2017-01-14 NOTE — Assessment & Plan Note (Signed)
No recent gout flares so he has stopped allopurinol. Last flare 01/2016.

## 2017-01-14 NOTE — Assessment & Plan Note (Addendum)
Chronic, off meds. Reviewed healthy diet and lifestyle changes to achieve better lipid control.  Consider statin given DM hx.

## 2017-01-14 NOTE — Progress Notes (Signed)
Pre visit review using our clinic review tool, if applicable. No additional management support is needed unless otherwise documented below in the visit note. 

## 2017-01-14 NOTE — Assessment & Plan Note (Signed)
Preventative protocols reviewed and updated unless pt declined. Discussed healthy diet and lifestyle.  

## 2017-01-14 NOTE — Assessment & Plan Note (Signed)
Discussed healthy diet and lifestyle changes to affect sustainable weight loss  

## 2017-01-14 NOTE — Progress Notes (Signed)
BP 130/88   Pulse 72   Temp 97.9 F (36.6 C) (Oral)   Ht 5' 10.5" (1.791 m)   Wt 291 lb (132 kg)   BMI 41.16 kg/m    CC: CPE Subjective:    Patient ID: Jorge Boyle, male    DOB: 1976/02/13, 41 y.o.   MRN: [id number]  HPI: Jorge Boyle is a 41 y.o. male presenting on 01/14/2017 for Annual Exam   LBP feeling better. Gout - not taking allopurinol recently. Prior on 139m daily, not recently. Recent transition to third shift - working at [employer]   Preventative: Flu - yearly Tdap 12/2011  Pneumovax 2016 Seat belt use discussed. Sunscreen use discussed. Denies changing moles on skin.  Non smoker Alcohol - occasional  Caffeine: 1-2 cups /day Lives with twin brother, 2 dogs Occupation: tJournalist, [employer] third shift Edu: HS Activity: goes to gym 3x/wk  Diet: good water, fruits/vegetables some, avoid sodas   Relevant past medical, surgical, family and social history reviewed and updated as indicated. Interim medical history since our last visit reviewed. Allergies and medications reviewed and updated. Outpatient Medications Prior to Visit  Medication Sig Dispense Refill  . Blood Glucose Monitoring Suppl (ONE TOUCH ULTRA SYSTEM KIT) W/DEVICE KIT Use as directed 1 each 0  . colchicine 0.6 MG tablet Take 1 tablet (0.6 mg total) by mouth 2 (two) times daily as needed (gout flare). 30 tablet 0  . glucose blood (ONE TOUCH ULTRA TEST) test strip Use to check sugars daily as needed Dx: E11.9 100 each 3  . allopurinol (ZYLOPRIM) 100 MG tablet Take 1 tablet (100 mg total) by mouth daily. 90 tablet 1  . cyclobenzaprine (FLEXERIL) 10 MG tablet Take 1 tablet (10 mg total) by mouth 3 (three) times daily as needed for muscle spasms. (Patient taking differently: Take 5 mg by mouth 2 (two) times daily as needed for muscle spasms. ) 30 tablet 0  . fluticasone (FLONASE) 50 MCG/ACT nasal spray PLACE 2 SPRAYS INTO BOTH NOSTRILS AS NEEDED. 16 g 3  . metFORMIN  (GLUCOPHAGE) 500 MG tablet TAKE 1 TABLET (500 MG TOTAL) BY MOUTH AT BEDTIME. 90 tablet 3   No facility-administered medications prior to visit.      Per HPI unless specifically indicated in ROS section below Review of Systems  Constitutional: Negative for activity change, appetite change, chills, fatigue, fever and unexpected weight change.  HENT: Positive for sore throat. Negative for hearing loss.   Eyes: Negative for visual disturbance.  Respiratory: Positive for cough (recent URI). Negative for chest tightness, shortness of breath and wheezing.   Cardiovascular: Negative for chest pain, palpitations and leg swelling.  Gastrointestinal: Negative for abdominal distention, abdominal pain, blood in stool, constipation, diarrhea, nausea and vomiting.  Genitourinary: Negative for difficulty urinating and hematuria.  Musculoskeletal: Negative for arthralgias, myalgias and neck pain.  Skin: Negative for rash.  Neurological: Negative for dizziness, seizures, syncope and headaches.  Hematological: Negative for adenopathy. Does not bruise/bleed easily.  Psychiatric/Behavioral: Negative for dysphoric mood. The patient is not nervous/anxious.        Objective:    BP 130/88   Pulse 72   Temp 97.9 F (36.6 C) (Oral)   Ht 5' 10.5" (1.791 m)   Wt 291 lb (132 kg)   BMI 41.16 kg/m   Wt Readings from Last 3 Encounters:  01/14/17 291 lb (132 kg)  01/02/17 283 lb 8 oz (128.6 kg)  12/09/16 291 lb 4 oz (132.1 kg)  Physical Exam  Constitutional: He is oriented to person, place, and time. He appears well-developed and well-nourished. No distress.  HENT:  Head: Normocephalic and atraumatic.  Right Ear: Hearing, tympanic membrane, external ear and ear canal normal.  Left Ear: Hearing, tympanic membrane, external ear and ear canal normal.  Nose: Nose normal.  Mouth/Throat: Uvula is midline, oropharynx is clear and moist and mucous membranes are normal. No oropharyngeal exudate, posterior  oropharyngeal edema or posterior oropharyngeal erythema.  Eyes: Conjunctivae and EOM are normal. Pupils are equal, round, and reactive to light. No scleral icterus.  Neck: Normal range of motion. Neck supple. No thyromegaly present.  Cardiovascular: Normal rate, regular rhythm, normal heart sounds and intact distal pulses.   No murmur heard. Pulses:      Radial pulses are 2+ on the right side, and 2+ on the left side.  Pulmonary/Chest: Effort normal and breath sounds normal. No respiratory distress. He has no wheezes. He has no rales.  Abdominal: Soft. Bowel sounds are normal. He exhibits no distension and no mass. There is no tenderness. There is no rebound and no guarding.  Musculoskeletal: Normal range of motion. He exhibits no edema.  Lymphadenopathy:    He has no cervical adenopathy.  Neurological: He is alert and oriented to person, place, and time.  CN grossly intact, station and gait intact  Skin: Skin is warm and dry. No rash noted.  Psychiatric: He has a normal mood and affect. His behavior is normal. Judgment and thought content normal.  Nursing note and vitals reviewed.  Results for orders placed or performed in visit on 01/07/17  Lipid panel  Result Value Ref Range   Cholesterol 171 0 - 200 mg/dL   Triglycerides 69.0 0.0 - 149.0 mg/dL   HDL 38.00 (L) >39.00 mg/dL   VLDL 13.8 0.0 - 40.0 mg/dL   LDL Cholesterol 119 (H) 0 - 99 mg/dL   Total CHOL/HDL Ratio 5    NonHDL 133.14   TSH  Result Value Ref Range   TSH 3.51 0.35 - 4.50 uIU/mL  Hemoglobin A1c  Result Value Ref Range   Hgb A1c MFr Bld 6.9 (H) 4.6 - 6.5 %  Basic metabolic panel  Result Value Ref Range   Sodium 140 135 - 145 mEq/L   Potassium 3.8 3.5 - 5.1 mEq/L   Chloride 104 96 - 112 mEq/L   CO2 28 19 - 32 mEq/L   Glucose, Bld 108 (H) 70 - 99 mg/dL   BUN 16 6 - 23 mg/dL   Creatinine, Ser 0.93 0.40 - 1.50 mg/dL   Calcium 9.2 8.4 - 10.5 mg/dL   GFR 95.04 >60.00 mL/min  Microalbumin / creatinine urine ratio    Result Value Ref Range   Microalb, Ur 1.6 0.0 - 1.9 mg/dL   Creatinine,U 198.9 mg/dL   Microalb Creat Ratio 0.8 0.0 - 30.0 mg/g  Uric acid  Result Value Ref Range   Uric Acid, Serum 8.0 (H) 4.0 - 7.8 mg/dL      Assessment & Plan:   Problem List Items Addressed This Visit    Diabetes type 2, controlled (HCC)    Chronic, stable. Continue metformin 536m daily.       Relevant Medications   metFORMIN (GLUCOPHAGE) 500 MG tablet   Dyslipidemia    Chronic, off meds. Reviewed healthy diet and lifestyle changes to achieve better lipid control.  Consider statin given DM hx.       Gout    No recent gout flares so he  has stopped allopurinol. Last flare 01/2016.       Health maintenance examination - Primary    Preventative protocols reviewed and updated unless pt declined. Discussed healthy diet and lifestyle.       Low back pain    Improved with increased walking. Rare PRN flexeril. Refilled today.       Relevant Medications   cyclobenzaprine (FLEXERIL) 5 MG tablet   Obesity, morbid, BMI 40.0-49.9 ([place])    Discussed healthy diet and lifestyle changes to affect sustainable weight loss.       Relevant Medications   metFORMIN (GLUCOPHAGE) 500 MG tablet       Follow up plan: Return in about 6 months (around 07/17/2017) for follow up visit.  Ria Bush, MD

## 2017-01-20 ENCOUNTER — Other Ambulatory Visit: Payer: 59

## 2017-01-24 ENCOUNTER — Encounter: Payer: 59 | Admitting: Family Medicine

## 2017-07-22 ENCOUNTER — Ambulatory Visit (INDEPENDENT_AMBULATORY_CARE_PROVIDER_SITE_OTHER): Payer: 59 | Admitting: Podiatry

## 2017-07-22 ENCOUNTER — Ambulatory Visit (INDEPENDENT_AMBULATORY_CARE_PROVIDER_SITE_OTHER): Payer: 59

## 2017-07-22 ENCOUNTER — Encounter: Payer: Self-pay | Admitting: Podiatry

## 2017-07-22 VITALS — BP 138/91 | HR 89 | Temp 98.1°F

## 2017-07-22 DIAGNOSIS — M7752 Other enthesopathy of left foot: Secondary | ICD-10-CM

## 2017-07-22 DIAGNOSIS — L989 Disorder of the skin and subcutaneous tissue, unspecified: Secondary | ICD-10-CM | POA: Diagnosis not present

## 2017-07-22 DIAGNOSIS — M79673 Pain in unspecified foot: Secondary | ICD-10-CM

## 2017-07-23 NOTE — Progress Notes (Signed)
   Subjective: 41 year old male with PMHx of diabetes mellitus presenting as a new patient with a chief complaint of callus lesions of the bilateral feet that have been painful for the past week. He also reports associated aching pain of the left foot. Standing for long periods of time on his feet increase the pain. He denies alleviating factors. Patient presents today for further treatment and evaluation.   Past Medical History:  Diagnosis Date  . Allergic rhinitis   . Diabetes mellitus without complication ([place])   . History of chicken pox   . Multiple thyroid nodules 01/2012   left, biopsy x2 benign, rpt [place] stable (01/2013, 07/2015) rec rpt 1 yr  . Nontoxic nodular goiter   . Obesity   . Prediabetes   . S/P excision of thyroid adenoma 2005   R thyroidectomy     Objective:  Physical Exam General: Alert and oriented x3 in no acute distress  Dermatology: Hyperkeratotic lesion present on the bilateral feet. Pain on palpation with a central nucleated core noted. Skin is warm, dry and supple bilateral lower extremities. Negative for open lesions or macerations.  Vascular: Palpable pedal pulses bilaterally. No edema or erythema noted. Capillary refill within normal limits.  Neurological: Epicritic and protective threshold diminished bilaterally.   Musculoskeletal Exam: Pain on palpation at the keratotic lesion noted. Range of motion within normal limits bilateral. Muscle strength 5/5 in all groups bilateral. Pain with palpation to the fifth MPJ of the left foot.  Radiographic Exam:  Normal osseous mineralization. Joint spaces preserved. No fracture/dislocation/boney destruction.    Assessment: #1 Diabetes mellitus w/ peripheral neuropathy #2 Porokeratotic callus lesions plantar feet bilaterally #3 fifth MPJ capsulitis left   Plan of Care:  #1 Patient evaluated. X-rays reviewed. #2 Injection of 0.5 mLs Celestone Soluspan injected into the left fifth MPJ. #3 silicone corn pads  dispensed today. #4 recommend custom molded orthotics, however patient cannot afford it.  #5 return to clinic when necessary.   Edrick Kins, DPM [facility]  Dr. Edrick Kins, [place]                                        [place], [place] [address]                Office [phone]  Fax [phone]

## 2017-07-30 MED ORDER — BETAMETHASONE SOD PHOS & ACET 6 (3-3) MG/ML IJ SUSP: 3.0000 mg | Freq: Once | INTRAMUSCULAR | Status: DC

## 2017-08-03 ENCOUNTER — Encounter: Payer: Self-pay | Admitting: Family Medicine

## 2017-10-12 ENCOUNTER — Telehealth: Payer: Self-pay | Admitting: Family Medicine

## 2017-10-12 MED ORDER — ACCU-CHEK AVIVA DEVI: 0 refills | Status: DC

## 2017-10-12 MED ORDER — GLUCOSE BLOOD VI STRP: ORAL_STRIP | 3 refills | Status: DC

## 2017-10-12 NOTE — Telephone Encounter (Signed)
Received notice that [facility] is no longer covering one touch strips but rather accu chek strips and kit.  plz notify patient that we've sent in accu chek strip and glucose meter to [facility].

## 2017-10-13 NOTE — Telephone Encounter (Addendum)
Relation to CQ:FJUV  Call back number:[phone]   Reason for call:  Patient returned call and informed of message below.

## 2017-10-13 NOTE — Telephone Encounter (Signed)
Left message on vm to call back.   Need to relay Dr. Synthia Innocent message.

## 2017-10-15 NOTE — Telephone Encounter (Signed)
Noted  

## 2017-10-27 ENCOUNTER — Ambulatory Visit: Payer: Self-pay | Admitting: *Deleted

## 2017-10-27 ENCOUNTER — Telehealth: Payer: Self-pay | Admitting: Family Medicine

## 2017-10-27 NOTE — Telephone Encounter (Signed)
Copied from [place] [phone]. Topic: Quick Communication - Rx Refill/Question >> Oct 27, 2017  5:27 PM Cecelia Byars, NT wrote: Has the patient contacted their pharmacy (Agent: If no, request that the patient contact the pharmacy for the refill.) Preferred Pharmacy (with phone number or street name):  [facility] Agent: Please be advised that RX refills may take up to 3 business days. We ask that you follow-up with your pharmacy. Called to see if diabetic testing supplies accu - chek guide meter ,accu  -guide  est strips, and accu  test fast clix  for a 90 day supply please call [phone] fax [phone]  reference # [id number]

## 2017-10-27 NOTE — Telephone Encounter (Signed)
Pt reports CBGs last night 400 and this AM 338.  Checked during call for 289. Takes Metformin 500mg  Q HS, however works night shift and takes in am. Took dose at 1050 this am. Reports has had cough and mild cold symptoms "for a while."  Also states weight gain of 12 lbs "in a month or so." "Sleepier than usual lately" but works night shift "always sleepy."  Denies any other symptoms. Agen tmade appt with Avie Echevaria, FNP for tomorrow 1/8 prior to triage call. Offered to find appt for today with another provider, Pt declined, wants to keep appt tomorrow.  Instructed to call back if symptoms worsen, continue to monitor CBGs, call back if 2 consecutive CBGs 400 or greater, any rapid breathing, vomiting.  Last A1C 12/2016.  Reason for Disposition . [1] Blood glucose > 300 mg/dl (16.5 mmol/l) AND [2] two or more times in a row  Answer Assessment - Initial Assessment Questions 1. BLOOD GLUCOSE: "What is your blood glucose level?"      338... 430 last night 2. ONSET: "When did you check the blood glucose?"    1045 this am 3. USUAL RANGE: "What is your glucose level usually?" (e.g., usual fasting morning value, usual evening value)     Mid 100's.  Never past 160's 4. KETONES: "Do you check for ketones (urine or blood test strips)?" If yes, ask: "What does the test show now?"      no 5. TYPE 1 or 2:  "Do you know what type of diabetes you have?"  (e.g., Type 1, Type 2, Gestational; doesn't know)      DM2 6. INSULIN: "Do you take insulin?" If yes, ask: "Have you missed any shots recently?"    no 7. DIABETES PILLS: "Do you take any pills for your diabetes?" If yes, ask: "Have you missed taking any pills recently?"     Metformin 8. OTHER SYMPTOMS: "Do you have any symptoms?" (e.g., fever, frequent urination, difficulty breathing, dizziness, weakness, vomiting)     Cough, cold symptoms, sleepier than usual.  Protocols used: DIABETES - HIGH BLOOD SUGAR-A-AH

## 2017-10-28 ENCOUNTER — Encounter: Payer: Self-pay | Admitting: Internal Medicine

## 2017-10-28 ENCOUNTER — Ambulatory Visit: Payer: 59 | Admitting: Internal Medicine

## 2017-10-28 VITALS — BP 132/82 | HR 77 | Temp 98.1°F | Wt 293.0 lb

## 2017-10-28 DIAGNOSIS — E1165 Type 2 diabetes mellitus with hyperglycemia: Secondary | ICD-10-CM

## 2017-10-28 LAB — MICROALBUMIN / CREATININE URINE RATIO
CREATININE, U: 105.1 mg/dL
MICROALB/CREAT RATIO: 0.8 mg/g (ref 0.0–30.0)
Microalb, Ur: 0.8 mg/dL (ref 0.0–1.9)

## 2017-10-28 LAB — HEMOGLOBIN A1C: HEMOGLOBIN A1C: 8.8 % — AB (ref 4.6–6.5)

## 2017-10-28 MED ORDER — ACCU-CHEK GUIDE W/DEVICE KIT: 1.0000 | PACK | Freq: Every day | 0 refills | Status: DC

## 2017-10-28 MED ORDER — ACCU-CHEK FASTCLIX LANCETS MISC: 1 refills | Status: DC

## 2017-10-28 MED ORDER — GLUCOSE BLOOD VI STRP: ORAL_STRIP | 1 refills | Status: DC

## 2017-10-28 NOTE — Telephone Encounter (Signed)
Refilled per protocol to [facility].

## 2017-10-28 NOTE — Patient Instructions (Signed)

## 2017-10-28 NOTE — Progress Notes (Signed)
Subjective:    Patient ID: Jorge Boyle, male    DOB: 1976/02/14, 42 y.o.   MRN: [id number]  HPI  Pt presents to the clinic today to have his A1C checked. His last A1C was 6.9%, 12/2016. He is currently taking Metformin 500 mg PO daily. His sugars range 289-400. He has noticed a 12 lb weight gain in the last month. He denies changes in diet. He does not exercise. He reports he does not feel overly stressed. He denies polyuria, polydipsia, urinary frequency or numbness or tingling in his hands or feet.  Review of Systems      Past Medical History:  Diagnosis Date  . Allergic rhinitis   . Diabetes mellitus without complication ([place])   . History of chicken pox   . Multiple thyroid nodules 01/2012   left, biopsy x2 benign, rpt [place] stable (01/2013, 07/2015) rec rpt 1 yr  . Nontoxic nodular goiter   . Obesity   . Prediabetes   . S/P excision of thyroid adenoma 2005   R thyroidectomy    Current Outpatient Medications  Medication Sig Dispense Refill  . ACCU-CHEK FASTCLIX LANCETS MISC Check blood sugar once daily and as needed. Dx E11.9 100 each 1  . Blood Glucose Monitoring Suppl (ACCU-CHEK GUIDE) w/Device KIT 1 Device by Does not apply route daily. Dx E11.9 1 kit 0  . colchicine 0.6 MG tablet Take 1 tablet (0.6 mg total) by mouth 2 (two) times daily as needed (gout flare). 30 tablet 0  . fluticasone (FLONASE) 50 MCG/ACT nasal spray Place 2 sprays into both nostrils as needed. 16 g 3  . glucose blood (ACCU-CHEK GUIDE) test strip Check blood sugar once daily and as needed. Dx E11.9 100 each 1  . metFORMIN (GLUCOPHAGE) 500 MG tablet Take 1 tab PO at bedtime 90 tablet 3   Current Facility-Administered Medications  Medication Dose Route Frequency Provider Last Rate Last Dose  . betamethasone acetate-betamethasone sodium phosphate (CELESTONE) injection 3 mg  3 mg Intramuscular Once Edrick Kins, DPM        Allergies  Allergen Reactions  . Penicillins Rash    Family History    Problem Relation Age of Onset  . Diabetes Mother   . Diabetes Father   . Stroke Maternal Grandmother   . Diabetes Maternal Grandfather   . Diabetes Paternal Grandmother   . Cancer Paternal Grandmother 35       breast  . Coronary artery disease Neg Hx   . Cancer Other        stomach, lung (smoker)    Social History   Socioeconomic History  . Marital status: Single    Spouse name: Not on file  . Number of children: Not on file  . Years of education: Not on file  . Highest education level: Not on file  Social Needs  . Financial resource strain: Not on file  . Food insecurity - worry: Not on file  . Food insecurity - inability: Not on file  . Transportation needs - medical: Not on file  . Transportation needs - non-medical: Not on file  Occupational History  . Not on file  Tobacco Use  . Smoking status: Never Smoker  . Smokeless tobacco: Never Used  Substance and Sexual Activity  . Alcohol use: Yes    Alcohol/week: 0.0 oz    Comment: rare  . Drug use: No  . Sexual activity: Not on file  Other Topics Concern  . 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  Respiratory: Denies difficulty breathing, shortness of breath, cough or sputum production.   Cardiovascular: Denies chest pain, chest tightness, palpitations or swelling in the hands or feet.  Gastrointestinal: Denies abdominal pain, bloating, constipation, diarrhea or blood in the stool.  GU: Denies urgency, frequency, pain with urination, burning sensation, blood in urine, odor or discharge. Skin: Denies redness, rashes, lesions or ulcercations.    No other specific complaints in a complete review of systems  (except as listed in HPI above).  Objective:   Physical Exam  BP 132/82   Pulse 77   Temp 98.1 F (36.7 C) (Oral)   Wt 293 lb (132.9 kg)   SpO2 97%   BMI 41.45 kg/m  Wt Readings from Last 3 Encounters:  10/28/17 293 lb (132.9 kg)  01/14/17 291 lb (132 kg)  01/02/17 283 lb 8 oz (128.6 kg)    General: Appears his stated age, obese in NAD. Skin: Warm, dry and intact. No ulcerations noted. Cardiovascular: Normal rate and rhythm. S1,S2 noted.  No murmur, rubs or gallops noted.  Pulmonary/Chest: Normal effort and positive vesicular breath sounds. No respiratory distress. No wheezes, rales or ronchi noted.  Neurological: Alert and oriented. Sensation intact   BMET    Component Value Date/Time   NA 140 01/07/2017 0821   NA 138 12/23/2013 0852   K 3.8 01/07/2017 0821   K 3.7 12/23/2013 0852   K 4.0 02/17/2006   CL 104 01/07/2017 0821   CL 105 12/23/2013 0852   CL 106 02/17/2006   CO2 28 01/07/2017 0821   CO2 28 12/23/2013 0852   GLUCOSE 108 (H) 01/07/2017 0821   GLUCOSE 102 (H) 12/23/2013 0852   BUN 16 01/07/2017 0821   BUN 8 12/23/2013 0852   CREATININE 0.93 01/07/2017 0821   CREATININE 0.83 07/26/2016 1653   CALCIUM 9.2 01/07/2017 0821   CALCIUM 8.6 12/23/2013 0852   CALCIUM 9.5 02/17/2006   GFRNONAA >60 12/23/2013 0852   GFRAA >60 12/23/2013 0852    Lipid Panel     Component Value Date/Time   CHOL 171 01/07/2017 0821   CHOL 178 02/17/2006   TRIG 69.0 01/07/2017 0821   TRIG 119 05/21/2013   HDL 38.00 (L) 01/07/2017 0821   CHOLHDL 5 01/07/2017 0821   VLDL 13.8 01/07/2017 0821   LDLCALC 119 (H) 01/07/2017 0821    CBC    Component Value Date/Time   WBC 10.3 01/17/2016 0806   RBC 4.68 01/17/2016 0806   HGB 14.1 01/17/2016 0806   HGB 14.5 12/23/2013 0852   HCT 41.6 01/17/2016 0806   HCT 42.6 12/23/2013 0852   PLT 277.0 01/17/2016 0806   PLT 277 12/23/2013 0852   MCV 89.0 01/17/2016 0806   MCV 91 12/23/2013 0852   MCH 31.0 12/23/2013 0852   MCHC 33.9  01/17/2016 0806   RDW 13.8 01/17/2016 0806   RDW 13.6 12/23/2013 0852   LYMPHSABS 2.1 01/17/2016 0806   MONOABS 0.7 01/17/2016 0806   EOSABS 0.3 01/17/2016 0806   BASOSABS 0.0 01/17/2016 0806    Hgb A1C Lab Results  Component Value Date   HGBA1C 6.9 (H) 01/07/2017  Assessment & Plan:   DM 2:  Uncontrolled We reviewed his diet, non compliant with low carb diet or exercise Handout given on diabetes diet A1C and microalbumin today, will follow up with you regarding the results Encouraged yearly eye exam and foot exam  Will follow up after labs, follow up with PCP in 2 months, sooner if sugars remain elevated. Webb Silversmith, NP

## 2017-10-29 ENCOUNTER — Encounter: Payer: Self-pay | Admitting: Family Medicine

## 2017-10-30 NOTE — Addendum Note (Signed)
Addended by: Lurlean Nanny on: 10/30/2017 04:53 PM   Modules accepted: Orders

## 2017-11-17 ENCOUNTER — Encounter: Payer: Self-pay | Admitting: Family Medicine

## 2017-11-18 ENCOUNTER — Telehealth: Payer: Self-pay

## 2017-11-18 MED ORDER — GLUCOSE BLOOD VI STRP: ORAL_STRIP | 0 refills | Status: DC

## 2017-11-18 NOTE — Telephone Encounter (Signed)
Sent to [facility]. 

## 2017-11-21 ENCOUNTER — Telehealth: Payer: Self-pay

## 2017-11-21 MED ORDER — METFORMIN HCL 1000 MG PO TABS: 1000.0000 mg | ORAL_TABLET | Freq: Two times a day (BID) | ORAL | 0 refills | Status: DC

## 2017-11-21 NOTE — Telephone Encounter (Signed)
Received new rx request for metformin 1000 mg BID from [facility].  Says pt told them his rx was changed.   Sent new rx to [facility] representing the above new dose, per Dr. Coralie Keens Pt Email, 10/29/17.]

## 2017-12-25 ENCOUNTER — Encounter: Payer: Self-pay | Admitting: *Deleted

## 2018-01-13 ENCOUNTER — Other Ambulatory Visit: Payer: Self-pay

## 2018-01-13 ENCOUNTER — Encounter: Payer: Self-pay | Admitting: Emergency Medicine

## 2018-01-13 ENCOUNTER — Emergency Department: Payer: 59

## 2018-01-13 ENCOUNTER — Emergency Department
Admission: EM | Admit: 2018-01-13 | Discharge: 2018-01-13 | Disposition: A | Discharge status: Home / Self Care | Payer: 59 | Attending: Emergency Medicine | Admitting: Emergency Medicine

## 2018-01-13 DIAGNOSIS — N201 Calculus of ureter: Secondary | ICD-10-CM | POA: Diagnosis not present

## 2018-01-13 DIAGNOSIS — Z7984 Long term (current) use of oral hypoglycemic drugs: Secondary | ICD-10-CM | POA: Insufficient documentation

## 2018-01-13 DIAGNOSIS — E119 Type 2 diabetes mellitus without complications: Secondary | ICD-10-CM | POA: Diagnosis not present

## 2018-01-13 DIAGNOSIS — K469 Unspecified abdominal hernia without obstruction or gangrene: Secondary | ICD-10-CM | POA: Insufficient documentation

## 2018-01-13 DIAGNOSIS — E079 Disorder of thyroid, unspecified: Secondary | ICD-10-CM | POA: Insufficient documentation

## 2018-01-13 DIAGNOSIS — R109 Unspecified abdominal pain: Secondary | ICD-10-CM | POA: Insufficient documentation

## 2018-01-13 DIAGNOSIS — R1033 Periumbilical pain: Secondary | ICD-10-CM | POA: Diagnosis present

## 2018-01-13 LAB — CBC WITH DIFFERENTIAL/PLATELET
BASOS ABS: 0.1 10*3/uL (ref 0–0.1)
Basophils Relative: 1 %
EOS ABS: 0.1 10*3/uL (ref 0–0.7)
EOS PCT: 1 %
HCT: 43.3 % (ref 40.0–52.0)
HEMOGLOBIN: 14.7 g/dL (ref 13.0–18.0)
LYMPHS ABS: 1.9 10*3/uL (ref 1.0–3.6)
LYMPHS PCT: 14 %
MCH: 30.4 pg (ref 26.0–34.0)
MCHC: 33.9 g/dL (ref 32.0–36.0)
MCV: 89.7 fL (ref 80.0–100.0)
Monocytes Absolute: 1 10*3/uL (ref 0.2–1.0)
Monocytes Relative: 7 %
NEUTROS PCT: 77 %
Neutro Abs: 10.8 10*3/uL — ABNORMAL HIGH (ref 1.4–6.5)
PLATELETS: 305 10*3/uL (ref 150–440)
RBC: 4.83 MIL/uL (ref 4.40–5.90)
RDW: 14.5 % (ref 11.5–14.5)
WBC: 13.9 10*3/uL — AB (ref 3.8–10.6)

## 2018-01-13 LAB — BASIC METABOLIC PANEL
ANION GAP: 10 (ref 5–15)
BUN: 17 mg/dL (ref 6–20)
CHLORIDE: 107 mmol/L (ref 101–111)
CO2: 22 mmol/L (ref 22–32)
Calcium: 8.8 mg/dL — ABNORMAL LOW (ref 8.9–10.3)
Creatinine, Ser: 1.18 mg/dL (ref 0.61–1.24)
GFR calc Af Amer: >60 mL/min (ref >60)
Glucose, Bld: 154 mg/dL — ABNORMAL HIGH (ref 65–99)
POTASSIUM: 3.9 mmol/L (ref 3.5–5.1)
SODIUM: 139 mmol/L (ref 135–145)

## 2018-01-13 LAB — LACTIC ACID, PLASMA: LACTIC ACID, VENOUS: 1.4 mmol/L (ref 0.5–1.9)

## 2018-01-13 MED ORDER — KETOROLAC TROMETHAMINE 30 MG/ML IJ SOLN
INTRAMUSCULAR | Status: AC
  Filled 2018-01-13: qty 1

## 2018-01-13 MED ORDER — TAMSULOSIN HCL 0.4 MG PO CAPS: 0.4000 mg | ORAL_CAPSULE | Freq: Every day | ORAL | 0 refills | Status: DC

## 2018-01-13 MED ORDER — IOPAMIDOL (ISOVUE-300) INJECTION 61%
30.0000 mL | Freq: Once | INTRAVENOUS | Status: AC | PRN
  Administered 2018-01-13: 30 mL via ORAL
  Filled 2018-01-13: qty 30

## 2018-01-13 MED ORDER — ONDANSETRON HCL 4 MG/2ML IJ SOLN
4.0000 mg | Freq: Once | INTRAMUSCULAR | Status: AC
  Administered 2018-01-13: 4 mg via INTRAVENOUS
  Filled 2018-01-13: qty 2

## 2018-01-13 MED ORDER — SODIUM CHLORIDE 0.9 % IV BOLUS
1000.0000 mL | Freq: Once | INTRAVENOUS | Status: AC
  Administered 2018-01-13: 1000 mL via INTRAVENOUS

## 2018-01-13 MED ORDER — KETOROLAC TROMETHAMINE 30 MG/ML IJ SOLN
30.0000 mg | Freq: Once | INTRAMUSCULAR | Status: AC
  Administered 2018-01-13: 30 mg via INTRAVENOUS

## 2018-01-13 MED ORDER — MORPHINE SULFATE (PF) 4 MG/ML IV SOLN
4.0000 mg | Freq: Once | INTRAVENOUS | Status: AC
  Administered 2018-01-13: 4 mg via INTRAVENOUS
  Filled 2018-01-13: qty 1

## 2018-01-13 MED ORDER — OXYCODONE-ACETAMINOPHEN 5-325 MG PO TABS: 1.0000 | ORAL_TABLET | Freq: Four times a day (QID) | ORAL | 0 refills | Status: DC | PRN

## 2018-01-13 MED ORDER — IOPAMIDOL (ISOVUE-300) INJECTION 61%
100.0000 mL | Freq: Once | INTRAVENOUS | Status: AC | PRN
  Administered 2018-01-13: 100 mL via INTRAVENOUS
  Filled 2018-01-13: qty 100

## 2018-01-13 MED ORDER — IBUPROFEN 600 MG PO TABS: 600.0000 mg | ORAL_TABLET | Freq: Four times a day (QID) | ORAL | 0 refills | Status: DC | PRN

## 2018-01-13 NOTE — Discharge Instructions (Addendum)
We have arranged for follow-up with [facility].  You should receive a call tomorrow for follow-up, but if you do not hear from them by the afternoon, you can call to arrange follow-up yourself.  We spoke to Dr. Bernardo Heater who was on-call today.  Return to the emergency department for any new, worsening, persistent severe pain, vomiting, inability to take the medication, fever, weakness, or any other new or worsening symptoms that concern you.  You should take the ibuprofen every 6 hours for pain, and the Percocet as needed for more severe pain.  We have also given Flomax to help the stone pass.

## 2018-01-13 NOTE — ED Provider Notes (Signed)
[facility] Emergency Department Provider Note ____________________________________________   First MD Initiated Contact with Patient 01/13/18 1818     (approximate)  I have reviewed the triage vital signs and the nursing notes.   HISTORY  Chief Complaint Hernia    HPI Jorge Boyle is a 42 y.o. male with past medical history as noted below including ventral hernia status post repair who presents with abdominal pain for the last 2 weeks, worse in the last 1-2 days, mainly in the periumbilical area where the hernia is, but somewhat generalized, and associated with possible mild increased distention around the hernia.  Patient states that he has been slightly constipated but did have a bowel movement last night or this morning.  He states he has not really been passing gas.  He reports nausea but no vomiting.   Past Medical History:  Diagnosis Date  . Allergic rhinitis   . Diabetes mellitus without complication ([place])   . History of chicken pox   . Multiple thyroid nodules 01/2012   left, biopsy x2 benign, rpt [place] stable (01/2013, 07/2015) rec rpt 1 yr  . Nontoxic nodular goiter   . Obesity   . Prediabetes   . S/P excision of thyroid adenoma 2005   R thyroidectomy    Patient Active Problem List   Diagnosis Date Noted  . Low back pain 02/05/2016  . Dyslipidemia 01/16/2016  . Gout 05/26/2015  . Health maintenance examination 01/04/2015  . Ventral hernia 11/22/2013  . Umbilical hernia 38/75/6433  . Type 2 diabetes, controlled, with peripheral neuropathy ([place])   . Thyroid nodule 01/14/2012  . S/P excision of thyroid adenoma 01/01/2012  . Obesity, morbid, BMI 40.0-49.9 ([place]) 01/01/2012    Past Surgical History:  Procedure Laterality Date  . HERNIA REPAIR  2/95/18   umbilical hernia  . THYROIDECTOMY, PARTIAL  2005   Right and isthmus  . TONSILLECTOMY AND ADENOIDECTOMY  1985  . UMBILICAL HERNIA REPAIR  2015   [place]    Prior to Admission  medications   Medication Sig Start Date End Date Taking? Authorizing Provider  ACCU-CHEK FASTCLIX LANCETS MISC Check blood sugar once daily and as needed. Dx E11.9 10/28/17   Ria Bush, MD  Blood Glucose Monitoring Suppl (ACCU-CHEK GUIDE) w/Device KIT 1 Device by Does not apply route daily. Dx E11.9 10/28/17   Ria Bush, MD  colchicine 0.6 MG tablet Take 1 tablet (0.6 mg total) by mouth 2 (two) times daily as needed (gout flare). 09/13/15   Ria Bush, MD  fluticasone [facility]) 50 MCG/ACT nasal spray Place 2 sprays into both nostrils as needed. 01/14/17   Ria Bush, MD  glucose blood (ACCU-CHEK GUIDE) test strip Check blood sugar once daily and as needed. Dx E11.9 11/18/17   Ria Bush, MD  ibuprofen (ADVIL,MOTRIN) 600 MG tablet Take 1 tablet (600 mg total) by mouth every 6 (six) hours as needed. 01/13/18   Arta Silence, MD  metFORMIN (GLUCOPHAGE) 1000 MG tablet Take 1 tablet (1,000 mg total) by mouth 2 (two) times daily with a meal. 11/21/17   Ria Bush, MD  oxyCODONE-acetaminophen (PERCOCET) 5-325 MG tablet Take 1 tablet by mouth every 6 (six) hours as needed for severe pain. 01/13/18   Arta Silence, MD  tamsulosin (FLOMAX) 0.4 MG CAPS capsule Take 1 capsule (0.4 mg total) by mouth daily after supper for 7 days. 01/13/18 01/20/18  Arta Silence, MD    Allergies Penicillins  Family History  Problem Relation Age of Onset  . Diabetes  Paternal Grandmother   . Cancer Paternal Grandmother 59       breast  . Diabetes Mother   . Diabetes Father   . Stroke Maternal Grandmother   . Diabetes Maternal Grandfather   . Cancer Other        stomach, lung (smoker)  . Coronary artery disease Neg Hx     Social History Social History   Tobacco Use  . Smoking status: Never Smoker  . Smokeless tobacco: Never Used  Substance Use Topics  . Alcohol use: Yes    Alcohol/week: 0.0 oz    Comment: rare  . Drug use: No    Review of  Systems  Constitutional: No fever. Eyes: No redness. ENT: No sore throat. Cardiovascular: Denies chest pain. Respiratory: Denies shortness of breath. Gastrointestinal: Positive for nausea.  Genitourinary: Negative for dysuria.  Musculoskeletal: Negative for back pain. Skin: Negative for rash. Neurological: Negative for headache.   ____________________________________________   PHYSICAL EXAM:  VITAL SIGNS: ED Triage Vitals  Enc Vitals Group     BP 01/13/18 1635 (!) 145/90     Pulse Rate 01/13/18 1634 92     Resp 01/13/18 1634 16     Temp 01/13/18 1634 (!) 97.4 F (36.3 C)     Temp Source 01/13/18 1634 Oral     SpO2 01/13/18 1634 96 %     Weight 01/13/18 1634 292 lb (132.5 kg)     Height 01/13/18 1634 '5\' 11"'  (1.803 m)     Head Circumference --      Peak Flow --      Pain Score 01/13/18 1634 8     Pain Loc --      Pain Edu? --      Excl. in [place]? --     Constitutional: Alert and oriented.  Slightly uncomfortable appearing but in no acute distress. Eyes: Conjunctivae are normal.  No scleral icterus. Head: Atraumatic. Nose: No congestion/rhinnorhea. Mouth/Throat: Mucous membranes are moist.   Neck: Normal range of motion.  Cardiovascular:Good peripheral circulation. Respiratory: Normal respiratory effort.  No retractions. Gastrointestinal: Soft with approximate 5 cm area of swelling to the left of the umbilicus which is mildly tender but feels reducible.  Abdomen overall slightly distended appearing.  No peritoneal signs. Genitourinary: No flank tenderness. Musculoskeletal:  Extremities warm and well perfused.  Neurologic:  Normal speech and language. No gross focal neurologic deficits are appreciated.  Skin:  Skin is warm and dry. No rash noted. Psychiatric: Mood and affect are normal. Speech and behavior are normal.  ____________________________________________   LABS (all labs ordered are listed, but only abnormal results are displayed)  Labs Reviewed  CBC WITH  DIFFERENTIAL/PLATELET - Abnormal; Notable for the following components:      Result Value   WBC 13.9 (*)    Neutro Abs 10.8 (*)    All other components within normal limits  BASIC METABOLIC PANEL - Abnormal; Notable for the following components:   Glucose, Bld 154 (*)    Calcium 8.8 (*)    All other components within normal limits  LACTIC ACID, PLASMA   ____________________________________________  EKG  ____________________________________________  RADIOLOGY  CT abdomen: 6-7 mm left ureteral stone with hydronephrosis  ____________________________________________   PROCEDURES  Procedure(s) performed: No  Procedures  Critical Care performed: No ____________________________________________   INITIAL IMPRESSION / ASSESSMENT AND PLAN / ED COURSE  Pertinent labs & imaging results that were available during my care of the patient were reviewed by me and considered in my medical  decision making (see chart for details).  42 year old male with past medical history as noted above and known history of ventral hernia (who is scheduled for repair in approximately 1 week) presents with worsening abdominal pain, as well as nausea, some abdominal distention, and apparently some constipation.  Exam is as described above; patient is relatively well-appearing, vital signs are normal except for hypertension, and the abdomen is slightly distended with palpable hernia that does appear to be reducible but with some generalized tenderness.  Presentation is most likely related to chronic hernia pain, however given the patient's report of some constipation, nausea, and decreased passing gas, I am concerned for incarceration, and less likely obstruction.  We will give analgesia, fluids, obtain labs, and CT abdomen.    ----------------------------------------- 9:11 PM on 01/13/2018 -----------------------------------------  CT shows large left ureteral stone which is somewhat proximal, and small  fat-containing hernia with no evidence of incarceration.  I suspect that the patient's pain is in fact due to this large obstructing stone.  His pain was significantly improved with morphine.  I will give Toradol additionally.  I consulted Dr. Bernardo Heater from urology who agreed with my plan of discharge if the patient's pain was well controlled, but advised that he would expedite the patient's follow-up for possible intervention.  I will reassess the patient after additional analgesia and plan for likely discharge home.  ----------------------------------------- 10:34 PM on 01/13/2018 -----------------------------------------  Patient has remained comfortable and has not required additional pain medications.  He feels well to go home.  Return precautions given, and he expressed understanding.  ____________________________________________   FINAL CLINICAL IMPRESSION(S) / ED DIAGNOSES  Final diagnoses:  Ureteral stone  Abdominal pain, unspecified abdominal location      NEW MEDICATIONS STARTED DURING THIS VISIT:  New Prescriptions   IBUPROFEN (ADVIL,MOTRIN) 600 MG TABLET    Take 1 tablet (600 mg total) by mouth every 6 (six) hours as needed.   OXYCODONE-ACETAMINOPHEN (PERCOCET) 5-325 MG TABLET    Take 1 tablet by mouth every 6 (six) hours as needed for severe pain.   TAMSULOSIN (FLOMAX) 0.4 MG CAPS CAPSULE    Take 1 capsule (0.4 mg total) by mouth daily after supper for 7 days.     Note:  This document was prepared using Dragon voice recognition software and may include unintentional dictation errors.    Arta Silence, MD 01/13/18 2235

## 2018-01-13 NOTE — ED Notes (Signed)
ED Provider at bedside to review results and update on status

## 2018-01-13 NOTE — ED Notes (Signed)
Patient educated about not driving or performing other critical tasks (such as operating heavy machinery, caring for infant/toddler/child) due to sedative nature of narcotic medications received while in the ED.  Pt/caregiver verbalized understanding.   

## 2018-01-13 NOTE — ED Triage Notes (Signed)
Pt to ED via POV with c/o abd hernia pain x2wks. PT ambulatory, VSS

## 2018-01-13 NOTE — ED Notes (Signed)
Pt states that he has surgery scheduled for April 2 for hernia. Was told to come to the ER if pain increased. States that pain has significantly increased in the last 48 hours.

## 2018-01-14 ENCOUNTER — Telehealth: Payer: Self-pay | Admitting: Urology

## 2018-01-14 NOTE — Telephone Encounter (Signed)
Ok to add on for 01-19-18 per [facility] Patient has been notified  Sharyn Lull

## 2018-01-16 NOTE — H&P (View-Only) (Signed)
01/19/2018 3:26 PM   Delfino Lovett [place] 08-25-1976 [id number]  Referring provider: Ria Bush, MD [place], [place] [address]  No chief complaint on file.   HPI: Patient is a 42 year old Caucasian male who is referred by [facility] ED for nephrolithiasis.  He presented to the ED on 01/13/2018 with the complaint of periumbilical pain, constipation and nausea.  Contrast CT study performed on 01/13/2018 demonstrated Adrenals/Urinary Tract: Normal adrenal glands.  Normal right kidney and right ureter.  Unremarkable urinary bladder.  Abnormal left renal hypoenhancement associated with hydronephrosis, nephro megaly, and mild perinephric stranding. There is an obstructing 6-7 millimeter calculus in the proximal left ureter about 3 centimeters distal to the left ureterovesical junction. No other nephrolithiasis or urologic calculus identified. Beyond the obstruction the left ureter is decompressed and normal.  WBC count was 13.9.  His creatinine was 1.18.  He was given Toradol in the ED.  Discharged on ibuprofen, oxycodone/APAP and tamsulosin.    He does not have a prior history of stones.    Today, he is having pain in the left lower quadrant radiating to his left lower back.  He has not had any passage of fragments.  He is having urinary frequency, urgency and nocturia.  They have worsened recently, but they are baseline urinary symptoms.  Patient denies any gross hematuria, dysuria or suprapubic/flank pain.  Patient denies any fevers, chills, nausea or vomiting.   Pain can reach a 8/10.  Nothing makes the pain worse.  Narcotic pain medications and NSAIDS are helping the pain.  His UA today is negative.    PMH: Past Medical History:  Diagnosis Date  . Allergic rhinitis   . Diabetes mellitus without complication ([place])   . History of chicken pox   . Multiple thyroid nodules 01/2012   left, biopsy x2 benign, rpt [place] stable (01/2013, 07/2015) rec rpt 1 yr  . Nontoxic nodular  goiter   . Obesity   . Prediabetes   . S/P excision of thyroid adenoma 2005   R thyroidectomy    Surgical History: Past Surgical History:  Procedure Laterality Date  . HERNIA REPAIR  6/59/93   umbilical hernia  . THYROIDECTOMY, PARTIAL  2005   Right and isthmus  . TONSILLECTOMY AND ADENOIDECTOMY  1985  . UMBILICAL HERNIA REPAIR  2015   [place]    Home Medications:  Allergies as of 01/19/2018      Reactions   Penicillins Rash      Medication List        Accurate as of 01/19/18  3:26 PM. Always use your most recent med list.          ACCU-CHEK FASTCLIX LANCETS Misc Check blood sugar once daily and as needed. Dx E11.9   ACCU-CHEK GUIDE w/Device Kit 1 Device by Does not apply route daily. Dx E11.9   colchicine 0.6 MG tablet Take 1 tablet (0.6 mg total) by mouth 2 (two) times daily as needed (gout flare).   fluticasone 50 MCG/ACT nasal spray Commonly known as:  FLONASE Place 2 sprays into both nostrils as needed.   glucose blood test strip Commonly known as:  ACCU-CHEK GUIDE Check blood sugar once daily and as needed. Dx E11.9   ibuprofen 600 MG tablet Commonly known as:  ADVIL,MOTRIN Take 1 tablet (600 mg total) by mouth every 6 (six) hours as needed.   metFORMIN 1000 MG tablet Commonly known as:  GLUCOPHAGE Take 1 tablet (1,000 mg total) by mouth  2 (two) times daily with a meal.   oxyCODONE-acetaminophen 5-325 MG tablet Commonly known as:  PERCOCET Take 1 tablet by mouth every 6 (six) hours as needed for severe pain.   tamsulosin 0.4 MG Caps capsule Commonly known as:  FLOMAX Take 1 capsule (0.4 mg total) by mouth daily.       Allergies:  Allergies  Allergen Reactions  . Penicillins Rash    Family History: Family History  Problem Relation Age of Onset  . Diabetes Paternal Grandmother   . Cancer Paternal Grandmother 75       breast  . Diabetes Mother   . Diabetes Father   . Stroke Maternal Grandmother   . Diabetes Maternal Grandfather   .  Cancer Other        stomach, lung (smoker)  . Coronary artery disease Neg Hx     Social History:  reports that he has never smoked. He has never used smokeless tobacco. He reports that he drinks alcohol. He reports that he does not use drugs.  ROS: UROLOGY Frequent Urination?: Yes Hard to postpone urination?: Yes Burning/pain with urination?: No Get up at night to urinate?: Yes Leakage of urine?: No Urine stream starts and stops?: No Trouble starting stream?: No Do you have to strain to urinate?: No Blood in urine?: No Urinary tract infection?: No Sexually transmitted disease?: No Injury to kidneys or bladder?: No Painful intercourse?: No Weak stream?: No Erection problems?: No Penile pain?: No  Gastrointestinal Nausea?: No Vomiting?: No Indigestion/heartburn?: Yes Diarrhea?: No Constipation?: Yes  Constitutional Fever: No Night sweats?: No Weight loss?: No Fatigue?: Yes  Skin Skin rash/lesions?: No Itching?: No  Eyes Blurred vision?: No Double vision?: No  Ears/Nose/Throat Sore throat?: No Sinus problems?: Yes  Hematologic/Lymphatic Swollen glands?: No Easy bruising?: No  Cardiovascular Leg swelling?: No Chest pain?: No  Respiratory Cough?: No Shortness of breath?: No  Endocrine Excessive thirst?: No  Musculoskeletal Back pain?: Yes Joint pain?: Yes  Neurological Headaches?: No Dizziness?: No  Psychologic Depression?: No Anxiety?: No  Physical Exam: BP 138/90   Pulse 85   Resp 16   Ht 5' 10" (1.778 m)   Wt 291 lb 3.2 oz (132.1 kg)   SpO2 98%   BMI 41.78 kg/m   Constitutional: Well nourished. Alert and oriented, No acute distress. HEENT: [place] AT, moist mucus membranes. Trachea midline, no masses. Cardiovascular: No clubbing, cyanosis, or edema. Respiratory: Normal respiratory effort, no increased work of breathing. GI: Abdomen is soft, non tender, non distended, no abdominal masses. Liver and spleen not palpable.  No hernias  appreciated.  Stool sample for occult testing is not indicated.   GU: No CVA tenderness.  No bladder fullness or masses.  Patient with circumcised phallus.  Urethral meatus is patent.  No penile discharge. No penile lesions or rashes. Scrotum without lesions, cysts, rashes and/or edema.  Testicles are located scrotally bilaterally. No masses are appreciated in the testicles. Left and right epididymis are normal. Rectal: Deferred Skin: No rashes, bruises or suspicious lesions. Lymph: No cervical or inguinal adenopathy. Neurologic: Grossly intact, no focal deficits, moving all 4 extremities. Psychiatric: Normal mood and affect.  Laboratory Data: Lab Results  Component Value Date   WBC 13.9 (H) 01/13/2018   HGB 14.7 01/13/2018   HCT 43.3 01/13/2018   MCV 89.7 01/13/2018   PLT 305 01/13/2018    Lab Results  Component Value Date   CREATININE 1.18 01/13/2018    No results found for: PSA  No results found  for: TESTOSTERONE  Lab Results  Component Value Date   HGBA1C 8.8 (H) 10/28/2017    Lab Results  Component Value Date   TSH 3.51 01/07/2017       Component Value Date/Time   CHOL 171 01/07/2017 0821   CHOL 178 02/17/2006   HDL 38.00 (L) 01/07/2017 0821   CHOLHDL 5 01/07/2017 0821   VLDL 13.8 01/07/2017 0821   LDLCALC 119 (H) 01/07/2017 0821    Lab Results  Component Value Date   AST 17 07/26/2016   Lab Results  Component Value Date   ALT 19 07/26/2016   No components found for: ALKALINEPHOPHATASE No components found for: BILIRUBINTOTAL  No results found for: ESTRADIOL  Urinalysis Negative.  See Epic.   I have reviewed the labs.   Pertinent Imaging: CLINICAL DATA:  42 year old male with ventral hernia and increasing abdominal pain in the past 48 hours.  EXAM: CT ABDOMEN AND PELVIS WITH CONTRAST  TECHNIQUE: Multidetector CT imaging of the abdomen and pelvis was performed using the standard protocol following bolus administration of intravenous  contrast.  CONTRAST:  118m ISOVUE-300 IOPAMIDOL (ISOVUE-300) INJECTION 61%  COMPARISON:  Lumbar radiographs 12/09/2016.  FINDINGS: Lower chest: Mild cardiomegaly. No pericardial effusion. Negative lung bases aside from mild lower lobe atelectasis or scarring. No pleural effusion.  Hepatobiliary: Generalized decreased density throughout the liver in keeping with steatosis. Negative gallbladder.  Pancreas: Negative.  Spleen: Negative.  Adrenals/Urinary Tract: Normal adrenal glands.  Normal right kidney and right ureter.  Unremarkable urinary bladder.  Abnormal left renal hypoenhancement associated with hydronephrosis, nephro megaly, and mild perinephric stranding. There is an obstructing 6-7 millimeter calculus in the proximal left ureter about 3 centimeters distal to the left ureterovesical junction. No other nephrolithiasis or urologic calculus identified. Beyond the obstruction the left ureter is decompressed and normal.  Stomach/Bowel: Negative rectum. Severe diverticulosis of the sigmoid colon and continuing into the descending colon. However, no active inflammation is identified. Negative transverse colon aside from redundancy and retained stool. Negative right colon. Normal appendix. Negative terminal ileum.  Oral contrast has not yet reached the distal small bowel. There is a midline umbilical mesenteric fat containing hernia with the hernia neck measuring about 15 millimeters diameter, and hernia sac of approximately 53 millimeters. No associated inflammation.  No dilated small bowel. The stomach is moderately distended with oral contrast. There is a tiny duodenum diverticulum on series 5, image 42 which appears inconsequential.  No abdominal free air or free fluid.  Vascular/Lymphatic: Major arterial structures in the abdomen and pelvis are patent with no atherosclerosis. The portal venous system is patent.  No lymphadenopathy in the  abdomen. Maximal external iliac chains lymph nodes might be reactive, perhaps related to the chronic sigmoid diverticulosis.  Reproductive: Negative.  Other: No pelvic free fluid.  Musculoskeletal: No acute osseous abnormality identified. Lower lumbar disc, endplate, and posterior element degeneration.  IMPRESSION: 1. Acute obstructive uropathy on the left. Obstructing 6-7 mm proximal left ureteral calculus located about 3 centimeters distal to the left UVJ. 2. No other urologic calculus or acute inflammation identified in the abdomen or pelvis. 3. Mesenteric fat containing umbilical hernia. 4. Severe diverticulosis of the descending and sigmoid colon. 5. Hepatic steatosis. 6. Mild cardiomegaly.   Electronically Signed   By: HGenevie AnnM.D.   On: 01/13/2018 20:11  I have independently reviewed the films with the patient   Assessment & Plan:    1. Left ureteral stone  - explained to the patient that AUA Guidelines  for patients with uncomplicated ureteral stones ?10 mm should be offered observation, and those with distal stones of similar size should be offered MET with ?-blockers -he will continue the tamsulosin, ibuprofen and oxycodone/APAP and strain the urine while waiting for ureteroscopy  - URS would be the first lined therapy if stone(s) do not pass, but ESWL is the procedure with the least morbidity and lowest complication rate, but URS has a greater stone-free rate in a single procedure -I explained that the ESWL would be less successful since his skin to stone distance was greater than 15 cm.    - schedule left ureteroscopy with laser lithotripsy and ureteral stent placement  - explained to the patient how the procedure is performed and the risks involved  - informed patient that they will have a stent placed during the procedure and will remain in place after the procedure for a short time.   - stent may be removed in the office with a cystoscope or patient may be  instructed to remove the stent themselves by the string  - described "stent pain" as feelings of needing to urinate/overactive bladder and a warm, tingling sensation to intense pain in the affected flank  - residual stones within the kidney or ureter may be present after the procedure and may need to have these addressed at a different encounter  - injury to the ureter is the most common intra-operative risk, it may result in an open procedure to correct the defect  - infection and bleeding are also risks  - explained the risks of general anesthesia, such as: MI, CVA, paralysis, coma and/or death.  - advised to contact our office or seek treatment in the ED if becomes febrile or pain/ vomiting are difficult control in order to arrange for emergent/urgent intervention  -Refills given on tamsulosin, ibuprofen and oxycodone/APAP.  Advised the patient that if he needs to take the narcotic pain medication he is not to operate heavy equipment, make important life decisions, not take more medication than is prescribed as it is potentially addictive and has a risk for overdose.  Patient voiced understanding of the stipulations.  - he is given a strainer and instructed to strain his urine  2. Left hydronephrosis  - obtain RUS to ensure the hydronephrosis has resolved once patient has recovered from URS   Return for left URS/LL/ureteral stent .  These notes generated with voice recognition software. I apologize for typographical errors.  Zara Council, [facility] [address] [place], [place] [address] [phone]

## 2018-01-16 NOTE — Progress Notes (Signed)
01/19/2018 3:26 PM   Jorge Boyle 12/30/1975 [id number]  Referring provider: Ria Bush, MD [place], [place] [address]  No chief complaint on file.   HPI: Patient is a 42 year old Caucasian male who is referred by [facility] ED for nephrolithiasis.  He presented to the ED on 01/13/2018 with the complaint of periumbilical pain, constipation and nausea.  Contrast CT study performed on 01/13/2018 demonstrated Adrenals/Urinary Tract: Normal adrenal glands.  Normal right kidney and right ureter.  Unremarkable urinary bladder.  Abnormal left renal hypoenhancement associated with hydronephrosis, nephro megaly, and mild perinephric stranding. There is an obstructing 6-7 millimeter calculus in the proximal left ureter about 3 centimeters distal to the left ureterovesical junction. No other nephrolithiasis or urologic calculus identified. Beyond the obstruction the left ureter is decompressed and normal.  WBC count was 13.9.  His creatinine was 1.18.  He was given Toradol in the ED.  Discharged on ibuprofen, oxycodone/APAP and tamsulosin.    He does not have a prior history of stones.    Today, he is having pain in the left lower quadrant radiating to his left lower back.  He has not had any passage of fragments.  He is having urinary frequency, urgency and nocturia.  They have worsened recently, but they are baseline urinary symptoms.  Patient denies any gross hematuria, dysuria or suprapubic/flank pain.  Patient denies any fevers, chills, nausea or vomiting.   Pain can reach a 8/10.  Nothing makes the pain worse.  Narcotic pain medications and NSAIDS are helping the pain.  His UA today is negative.    PMH: Past Medical History:  Diagnosis Date  . Allergic rhinitis   . Diabetes mellitus without complication ([place])   . History of chicken pox   . Multiple thyroid nodules 01/2012   left, biopsy x2 benign, rpt [place] stable (01/2013, 07/2015) rec rpt 1 yr  . Nontoxic nodular  goiter   . Obesity   . Prediabetes   . S/P excision of thyroid adenoma 2005   R thyroidectomy    Surgical History: Past Surgical History:  Procedure Laterality Date  . HERNIA REPAIR  6/59/93   umbilical hernia  . THYROIDECTOMY, PARTIAL  2005   Right and isthmus  . TONSILLECTOMY AND ADENOIDECTOMY  1985  . UMBILICAL HERNIA REPAIR  2015   [place]    Home Medications:  Allergies as of 01/19/2018      Reactions   Penicillins Rash      Medication List        Accurate as of 01/19/18  3:26 PM. Always use your most recent med list.          ACCU-CHEK FASTCLIX LANCETS Misc Check blood sugar once daily and as needed. Dx E11.9   ACCU-CHEK GUIDE w/Device Kit 1 Device by Does not apply route daily. Dx E11.9   colchicine 0.6 MG tablet Take 1 tablet (0.6 mg total) by mouth 2 (two) times daily as needed (gout flare).   fluticasone 50 MCG/ACT nasal spray Commonly known as:  FLONASE Place 2 sprays into both nostrils as needed.   glucose blood test strip Commonly known as:  ACCU-CHEK GUIDE Check blood sugar once daily and as needed. Dx E11.9   ibuprofen 600 MG tablet Commonly known as:  ADVIL,MOTRIN Take 1 tablet (600 mg total) by mouth every 6 (six) hours as needed.   metFORMIN 1000 MG tablet Commonly known as:  GLUCOPHAGE Take 1 tablet (1,000 mg total) by mouth  2 (two) times daily with a meal.   oxyCODONE-acetaminophen 5-325 MG tablet Commonly known as:  PERCOCET Take 1 tablet by mouth every 6 (six) hours as needed for severe pain.   tamsulosin 0.4 MG Caps capsule Commonly known as:  FLOMAX Take 1 capsule (0.4 mg total) by mouth daily.       Allergies:  Allergies  Allergen Reactions  . Penicillins Rash    Family History: Family History  Problem Relation Age of Onset  . Diabetes Paternal Grandmother   . Cancer Paternal Grandmother 41       breast  . Diabetes Mother   . Diabetes Father   . Stroke Maternal Grandmother   . Diabetes Maternal Grandfather   .  Cancer Other        stomach, lung (smoker)  . Coronary artery disease Neg Hx     Social History:  reports that he has never smoked. He has never used smokeless tobacco. He reports that he drinks alcohol. He reports that he does not use drugs.  ROS: UROLOGY Frequent Urination?: Yes Hard to postpone urination?: Yes Burning/pain with urination?: No Get up at night to urinate?: Yes Leakage of urine?: No Urine stream starts and stops?: No Trouble starting stream?: No Do you have to strain to urinate?: No Blood in urine?: No Urinary tract infection?: No Sexually transmitted disease?: No Injury to kidneys or bladder?: No Painful intercourse?: No Weak stream?: No Erection problems?: No Penile pain?: No  Gastrointestinal Nausea?: No Vomiting?: No Indigestion/heartburn?: Yes Diarrhea?: No Constipation?: Yes  Constitutional Fever: No Night sweats?: No Weight loss?: No Fatigue?: Yes  Skin Skin rash/lesions?: No Itching?: No  Eyes Blurred vision?: No Double vision?: No  Ears/Nose/Throat Sore throat?: No Sinus problems?: Yes  Hematologic/Lymphatic Swollen glands?: No Easy bruising?: No  Cardiovascular Leg swelling?: No Chest pain?: No  Respiratory Cough?: No Shortness of breath?: No  Endocrine Excessive thirst?: No  Musculoskeletal Back pain?: Yes Joint pain?: Yes  Neurological Headaches?: No Dizziness?: No  Psychologic Depression?: No Anxiety?: No  Physical Exam: BP 138/90   Pulse 85   Resp 16   Ht 5' 10" (1.778 m)   Wt 291 lb 3.2 oz (132.1 kg)   SpO2 98%   BMI 41.78 kg/m   Constitutional: Well nourished. Alert and oriented, No acute distress. HEENT: [place] AT, moist mucus membranes. Trachea midline, no masses. Cardiovascular: No clubbing, cyanosis, or edema. Respiratory: Normal respiratory effort, no increased work of breathing. GI: Abdomen is soft, non tender, non distended, no abdominal masses. Liver and spleen not palpable.  No hernias  appreciated.  Stool sample for occult testing is not indicated.   GU: No CVA tenderness.  No bladder fullness or masses.  Patient with circumcised phallus.  Urethral meatus is patent.  No penile discharge. No penile lesions or rashes. Scrotum without lesions, cysts, rashes and/or edema.  Testicles are located scrotally bilaterally. No masses are appreciated in the testicles. Left and right epididymis are normal. Rectal: Deferred Skin: No rashes, bruises or suspicious lesions. Lymph: No cervical or inguinal adenopathy. Neurologic: Grossly intact, no focal deficits, moving all 4 extremities. Psychiatric: Normal mood and affect.  Laboratory Data: Lab Results  Component Value Date   WBC 13.9 (H) 01/13/2018   HGB 14.7 01/13/2018   HCT 43.3 01/13/2018   MCV 89.7 01/13/2018   PLT 305 01/13/2018    Lab Results  Component Value Date   CREATININE 1.18 01/13/2018    No results found for: PSA  No results found   for: TESTOSTERONE  Lab Results  Component Value Date   HGBA1C 8.8 (H) 10/28/2017    Lab Results  Component Value Date   TSH 3.51 01/07/2017       Component Value Date/Time   CHOL 171 01/07/2017 0821   CHOL 178 02/17/2006   HDL 38.00 (L) 01/07/2017 0821   CHOLHDL 5 01/07/2017 0821   VLDL 13.8 01/07/2017 0821   LDLCALC 119 (H) 01/07/2017 0821    Lab Results  Component Value Date   AST 17 07/26/2016   Lab Results  Component Value Date   ALT 19 07/26/2016   No components found for: ALKALINEPHOPHATASE No components found for: BILIRUBINTOTAL  No results found for: ESTRADIOL  Urinalysis Negative.  See Epic.   I have reviewed the labs.   Pertinent Imaging: CLINICAL DATA:  41-year-old male with ventral hernia and increasing abdominal pain in the past 48 hours.  EXAM: CT ABDOMEN AND PELVIS WITH CONTRAST  TECHNIQUE: Multidetector CT imaging of the abdomen and pelvis was performed using the standard protocol following bolus administration of intravenous  contrast.  CONTRAST:  100mL ISOVUE-300 IOPAMIDOL (ISOVUE-300) INJECTION 61%  COMPARISON:  Lumbar radiographs 12/09/2016.  FINDINGS: Lower chest: Mild cardiomegaly. No pericardial effusion. Negative lung bases aside from mild lower lobe atelectasis or scarring. No pleural effusion.  Hepatobiliary: Generalized decreased density throughout the liver in keeping with steatosis. Negative gallbladder.  Pancreas: Negative.  Spleen: Negative.  Adrenals/Urinary Tract: Normal adrenal glands.  Normal right kidney and right ureter.  Unremarkable urinary bladder.  Abnormal left renal hypoenhancement associated with hydronephrosis, nephro megaly, and mild perinephric stranding. There is an obstructing 6-7 millimeter calculus in the proximal left ureter about 3 centimeters distal to the left ureterovesical junction. No other nephrolithiasis or urologic calculus identified. Beyond the obstruction the left ureter is decompressed and normal.  Stomach/Bowel: Negative rectum. Severe diverticulosis of the sigmoid colon and continuing into the descending colon. However, no active inflammation is identified. Negative transverse colon aside from redundancy and retained stool. Negative right colon. Normal appendix. Negative terminal ileum.  Oral contrast has not yet reached the distal small bowel. There is a midline umbilical mesenteric fat containing hernia with the hernia neck measuring about 15 millimeters diameter, and hernia sac of approximately 53 millimeters. No associated inflammation.  No dilated small bowel. The stomach is moderately distended with oral contrast. There is a tiny duodenum diverticulum on series 5, image 42 which appears inconsequential.  No abdominal free air or free fluid.  Vascular/Lymphatic: Major arterial structures in the abdomen and pelvis are patent with no atherosclerosis. The portal venous system is patent.  No lymphadenopathy in the  abdomen. Maximal external iliac chains lymph nodes might be reactive, perhaps related to the chronic sigmoid diverticulosis.  Reproductive: Negative.  Other: No pelvic free fluid.  Musculoskeletal: No acute osseous abnormality identified. Lower lumbar disc, endplate, and posterior element degeneration.  IMPRESSION: 1. Acute obstructive uropathy on the left. Obstructing 6-7 mm proximal left ureteral calculus located about 3 centimeters distal to the left UVJ. 2. No other urologic calculus or acute inflammation identified in the abdomen or pelvis. 3. Mesenteric fat containing umbilical hernia. 4. Severe diverticulosis of the descending and sigmoid colon. 5. Hepatic steatosis. 6. Mild cardiomegaly.   Electronically Signed   By: H  Hall M.D.   On: 01/13/2018 20:11  I have independently reviewed the films with the patient   Assessment & Plan:    1. Left ureteral stone  - explained to the patient that AUA Guidelines   for patients with uncomplicated ureteral stones ?10 mm should be offered observation, and those with distal stones of similar size should be offered MET with ?-blockers -he will continue the tamsulosin, ibuprofen and oxycodone/APAP and strain the urine while waiting for ureteroscopy  - URS would be the first lined therapy if stone(s) do not pass, but ESWL is the procedure with the least morbidity and lowest complication rate, but URS has a greater stone-free rate in a single procedure -I explained that the ESWL would be less successful since his skin to stone distance was greater than 15 cm.    - schedule left ureteroscopy with laser lithotripsy and ureteral stent placement  - explained to the patient how the procedure is performed and the risks involved  - informed patient that they will have a stent placed during the procedure and will remain in place after the procedure for a short time.   - stent may be removed in the office with a cystoscope or patient may be  instructed to remove the stent themselves by the string  - described "stent pain" as feelings of needing to urinate/overactive bladder and a warm, tingling sensation to intense pain in the affected flank  - residual stones within the kidney or ureter may be present after the procedure and may need to have these addressed at a different encounter  - injury to the ureter is the most common intra-operative risk, it may result in an open procedure to correct the defect  - infection and bleeding are also risks  - explained the risks of general anesthesia, such as: MI, CVA, paralysis, coma and/or death.  - advised to contact our office or seek treatment in the ED if becomes febrile or pain/ vomiting are difficult control in order to arrange for emergent/urgent intervention  -Refills given on tamsulosin, ibuprofen and oxycodone/APAP.  Advised the patient that if he needs to take the narcotic pain medication he is not to operate heavy equipment, make important life decisions, not take more medication than is prescribed as it is potentially addictive and has a risk for overdose.  Patient voiced understanding of the stipulations.  - he is given a strainer and instructed to strain his urine  2. Left hydronephrosis  - obtain RUS to ensure the hydronephrosis has resolved once patient has recovered from URS   Return for left URS/LL/ureteral stent .  These notes generated with voice recognition software. I apologize for typographical errors.  Varian Innes, PA-C  [facility] [address], [address] [place], [place] [address] [phone]  

## 2018-01-19 ENCOUNTER — Other Ambulatory Visit: Payer: Self-pay | Admitting: Radiology

## 2018-01-19 ENCOUNTER — Encounter: Payer: Self-pay | Admitting: Radiology

## 2018-01-19 ENCOUNTER — Encounter: Payer: Self-pay | Admitting: Urology

## 2018-01-19 ENCOUNTER — Ambulatory Visit: Payer: 59 | Admitting: Urology

## 2018-01-19 VITALS — BP 138/90 | HR 85 | Resp 16 | Ht 70.0 in | Wt 291.2 lb

## 2018-01-19 DIAGNOSIS — N201 Calculus of ureter: Secondary | ICD-10-CM

## 2018-01-19 DIAGNOSIS — N132 Hydronephrosis with renal and ureteral calculous obstruction: Secondary | ICD-10-CM

## 2018-01-19 LAB — MICROSCOPIC EXAMINATION
Bacteria, UA: NONE SEEN
Epithelial Cells (non renal): NONE SEEN /hpf (ref 0–10)

## 2018-01-19 LAB — URINALYSIS, COMPLETE
Bilirubin, UA: NEGATIVE
GLUCOSE, UA: NEGATIVE
Ketones, UA: NEGATIVE
Leukocytes, UA: NEGATIVE
Nitrite, UA: NEGATIVE
PROTEIN UA: NEGATIVE
Specific Gravity, UA: 1.02 (ref 1.005–1.030)
UUROB: 0.2 mg/dL (ref 0.2–1.0)
pH, UA: 5.5 (ref 5.0–7.5)

## 2018-01-19 MED ORDER — IBUPROFEN 600 MG PO TABS: 600.0000 mg | ORAL_TABLET | Freq: Four times a day (QID) | ORAL | 0 refills | Status: DC | PRN

## 2018-01-19 MED ORDER — OXYCODONE-ACETAMINOPHEN 5-325 MG PO TABS: 1.0000 | ORAL_TABLET | Freq: Four times a day (QID) | ORAL | 0 refills | Status: DC | PRN

## 2018-01-19 MED ORDER — TAMSULOSIN HCL 0.4 MG PO CAPS: 0.4000 mg | ORAL_CAPSULE | Freq: Every day | ORAL | 3 refills | Status: DC

## 2018-01-20 ENCOUNTER — Encounter: Payer: Self-pay | Admitting: General Surgery

## 2018-01-20 ENCOUNTER — Ambulatory Visit: Payer: 59 | Admitting: General Surgery

## 2018-01-20 VITALS — BP 142/80 | HR 86 | Resp 16 | Ht 70.0 in | Wt 296.0 lb

## 2018-01-20 DIAGNOSIS — K439 Ventral hernia without obstruction or gangrene: Secondary | ICD-10-CM

## 2018-01-20 NOTE — Progress Notes (Signed)
Patient ID: Jorge Boyle, male   DOB: May 18, 1976, 42 y.o.   MRN: [id number]  Chief Complaint  Patient presents with  . Hernia    HPI Jorge Boyle is a 42 y.o. male.  Here for evaluation of an umbilical hernia. He states he went to the ED because he had sharpe pains but it turned out to be kidney stone pain. He states the CT showed a hernia as well. He states he has noticed a bulge beside his belly button for 1-2 months. He states couple weeks ago the area was red. He states the area hurts when he bends over. He is still training his urine to see if he passes the kidney stone.  HPI  Past Medical History:  Diagnosis Date  . Allergic rhinitis   . Diabetes mellitus without complication ([place])   . History of chicken pox   . Multiple thyroid nodules 01/2012   left, biopsy x2 benign, rpt [place] stable (01/2013, 07/2015) rec rpt 1 yr  . Nontoxic nodular goiter   . Obesity   . Prediabetes   . S/P excision of thyroid adenoma 2005   R thyroidectomy    Past Surgical History:  Procedure Laterality Date  . THYROIDECTOMY, PARTIAL  2005   Right and isthmus. Dr Nadeen Landau  . TONSILLECTOMY AND ADENOIDECTOMY  1985  . UMBILICAL HERNIA REPAIR  12/30/2013   Odessie Polzin    Family History  Problem Relation Age of Onset  . Diabetes Paternal Grandmother   . Cancer Paternal Grandmother 3       breast  . Diabetes Mother   . Diabetes Father   . Stroke Maternal Grandmother   . Diabetes Maternal Grandfather   . Cancer Other        stomach, lung (smoker)  . Coronary artery disease Neg Hx     Social History Social History   Tobacco Use  . Smoking status: Never Smoker  . Smokeless tobacco: Never Used  Substance Use Topics  . Alcohol use: Yes    Alcohol/week: 0.0 oz    Comment: rare  . Drug use: No    Allergies  Allergen Reactions  . Penicillins Rash    Current Outpatient Medications  Medication Sig Dispense Refill  . ACCU-CHEK FASTCLIX LANCETS MISC Check blood sugar once daily  and as needed. Dx E11.9 100 each 1  . Blood Glucose Monitoring Suppl (ACCU-CHEK GUIDE) w/Device KIT 1 Device by Does not apply route daily. Dx E11.9 1 kit 0  . colchicine 0.6 MG tablet Take 1 tablet (0.6 mg total) by mouth 2 (two) times daily as needed (gout flare). 30 tablet 0  . fluticasone (FLONASE) 50 MCG/ACT nasal spray Place 2 sprays into both nostrils as needed. 16 g 3  . glucose blood (ACCU-CHEK GUIDE) test strip Check blood sugar once daily and as needed. Dx E11.9 100 each 0  . ibuprofen (ADVIL,MOTRIN) 600 MG tablet Take 1 tablet (600 mg total) by mouth every 6 (six) hours as needed. 30 tablet 0  . metFORMIN (GLUCOPHAGE) 1000 MG tablet Take 1 tablet (1,000 mg total) by mouth 2 (two) times daily with a meal. 180 tablet 0  . oxyCODONE-acetaminophen (PERCOCET) 5-325 MG tablet Take 1 tablet by mouth every 6 (six) hours as needed for severe pain. 15 tablet 0  . tamsulosin (FLOMAX) 0.4 MG CAPS capsule Take 1 capsule (0.4 mg total) by mouth daily. 30 capsule 3   Current Facility-Administered Medications  Medication Dose Route Frequency Provider Last Rate Last Dose  .  betamethasone acetate-betamethasone sodium phosphate (CELESTONE) injection 3 mg  3 mg Intramuscular Once Edrick Kins, DPM        Review of Systems Review of Systems  Constitutional: Negative.  Negative for chills and fever.  Respiratory: Negative.   Cardiovascular: Negative.   Gastrointestinal: Negative for constipation and diarrhea.  Genitourinary: Negative.     Blood pressure (!) 142/80, pulse 86, resp. rate 16, height '5\' 10"'  (1.778 m), weight 296 lb (134.3 kg), SpO2 97 %.  Physical Exam Physical Exam  Constitutional: He is oriented to person, place, and time. He appears well-developed and well-nourished.  HENT:  Mouth/Throat: Oropharynx is clear and moist.  Eyes: Conjunctivae are normal. No scleral icterus.  Neck: Neck supple.  Cardiovascular: Normal rate, regular rhythm and normal heart sounds.   Pulmonary/Chest: Effort normal and breath sounds normal.  Abdominal: Soft. Normal appearance and bowel sounds are normal. There is no tenderness. A hernia is present. Hernia confirmed positive in the ventral area.    Ventral hernia  Lymphadenopathy:    He has no cervical adenopathy.  Neurological: He is alert and oriented to person, place, and time.  Skin: Skin is warm and dry.  Psychiatric: His behavior is normal.    Data Reviewed January 13, 2018 CT of the abdomen and pelvis obtained for abdominal pain shows a large amount of fat in the umbilical area without evidence of entrapped bowel.  Fascial defect measures about 1.5 cm.  Proximal kidney stone on the left with changes of hydronephrosis.  Urology notes from [place], [place] of January 19, 2018 reviewed, plans for ureteroscopy and lithotripsy with stent placement February 02, 2018 if the patient does not pass the stone on his own.  Assessment    Recurrent umbilical hernia with incarcerated omentum.    Plan    He has surgery with Dr Erlene Quan planned for 02-02-18.  To minimize to anesthetic exposures, we will plan for laparoscopic repair of the hernia with mesh placement after ureteral stent placement.  He has no clinical symptoms to suggest infection above the stone, and I think the risk of a secondary infection is small.  He will receive prophylactic antibiotics.  The large amount of omentum trapped within the defect may require a counterincision at the umbilicus to extract this, as review of his original operative notes showed that I did extend the original defect from 1 cm to about 1-1/2 cm to be able to reduce the redundant omentum.  Hernia precautions and incarceration were discussed with the patient. If they develop symptoms of an incarcerated hernia, they were encouraged to seek prompt medical attention.  I have recommended repair of the hernia using mesh on an outpatient basis in the near future. The risk of infection was  reviewed. The role of prosthetic mesh to minimize the risk of recurrence was reviewed.     HPI, Physical Exam, Assessment and Plan have been scribed under the direction and in the presence of Robert Bellow, MD. Karie Fetch, RN  I have completed the exam and reviewed the above documentation for accuracy and completeness.  I agree with the above.  Haematologist has been used and any errors in dictation or transcription are unintentional.  Hervey Ard, M.D., F.A.C.S.  The patient is schedule for surgery at [facility] 02/02/18. He will pre admit by phone. The patient is aware of date and instructions.  Documented by Caryl-Lyn Otis Brace LPN  Forest Gleason Adalee Kathan 01/21/2018, 8:54 AM

## 2018-01-20 NOTE — Patient Instructions (Addendum)

## 2018-01-22 LAB — CULTURE, URINE COMPREHENSIVE

## 2018-01-27 ENCOUNTER — Encounter
Admission: RE | Admit: 2018-01-27 | Discharge: 2018-01-27 | Disposition: A | Discharge status: Home / Self Care | Payer: 59 | Source: Ambulatory Visit | Attending: Urology | Admitting: Urology

## 2018-01-27 ENCOUNTER — Other Ambulatory Visit: Payer: Self-pay

## 2018-01-27 DIAGNOSIS — E119 Type 2 diabetes mellitus without complications: Secondary | ICD-10-CM | POA: Insufficient documentation

## 2018-01-27 DIAGNOSIS — N201 Calculus of ureter: Secondary | ICD-10-CM | POA: Diagnosis not present

## 2018-01-27 DIAGNOSIS — K439 Ventral hernia without obstruction or gangrene: Secondary | ICD-10-CM | POA: Diagnosis not present

## 2018-01-27 DIAGNOSIS — Z01818 Encounter for other preprocedural examination: Secondary | ICD-10-CM | POA: Diagnosis not present

## 2018-01-27 HISTORY — DX: Headache: R51

## 2018-01-27 HISTORY — DX: Headache, unspecified: R51.9

## 2018-01-27 HISTORY — DX: Personal history of urinary calculi: Z87.442

## 2018-01-27 LAB — BASIC METABOLIC PANEL WITH GFR
Anion gap: 10 (ref 5–15)
BUN: 14 mg/dL (ref 6–20)
CO2: 27 mmol/L (ref 22–32)
Calcium: 9 mg/dL (ref 8.9–10.3)
Chloride: 103 mmol/L (ref 101–111)
Creatinine, Ser: 0.84 mg/dL (ref 0.61–1.24)
GFR calc Af Amer: >60 mL/min
GFR calc non Af Amer: >60 mL/min
Glucose, Bld: 99 mg/dL (ref 65–99)
Potassium: 3.7 mmol/L (ref 3.5–5.1)
Sodium: 140 mmol/L (ref 135–145)

## 2018-01-27 LAB — URINALYSIS, ROUTINE W REFLEX MICROSCOPIC
Bilirubin Urine: NEGATIVE
Glucose, UA: NEGATIVE mg/dL
Hgb urine dipstick: NEGATIVE
KETONES UR: NEGATIVE mg/dL
LEUKOCYTES UA: NEGATIVE
NITRITE: NEGATIVE
PH: 7 (ref 5.0–8.0)
Protein, ur: NEGATIVE mg/dL
SPECIFIC GRAVITY, URINE: 1.017 (ref 1.005–1.030)

## 2018-01-27 NOTE — Patient Instructions (Signed)
  Your procedure is scheduled on: Monday February 02, 2018 Report to Same Day Surgery 2nd floor medical mall ([place] Entrance-take elevator on left to 2nd floor.  Check in with surgery information desk.) To find out your arrival time please call [phone] between 1PM - 3PM on Friday January 30, 2018  Remember: Instructions that are not followed completely may result in serious medical risk, up to and including death, or upon the discretion of your surgeon and anesthesiologist your surgery may need to be rescheduled.    _x___ 1. Do not eat food after midnight the night before your procedure. You may drink water up to 2 hours before you are scheduled to arrive at the hospital for your procedure.  Do not drink anything within 2 hours of your scheduled arrival to the hospital.   No gum chewing or hard candies.      __x__ 2. No Alcohol for 24 hours before or after surgery.   __x__3. No Smoking or e-cigarettes for 24 prior to surgery.  Do not use any chewable tobacco products for at least 6 hour prior to surgery   ____  4. Bring all medications with you on the day of surgery if instructed.    __x__ 5. Notify your doctor if there is any change in your medical condition     (cold, fever, infections).   __x__6. On the morning of surgery brush your teeth with toothpaste and water.  You may rinse your mouth with mouth wash if you wish.  Do not swallow any toothpaste or mouthwash.   Do not wear jewelry.  Do not wear lotions, powders, or perfumes. You may wear deodorant.  Do not shave 48 hours prior to surgery. Men may shave face and neck.  Do not bring valuables to the hospital.    [facility] is not responsible for any belongings or valuables.               Contacts, dentures or bridgework may not be worn into surgery.  Leave your suitcase in the car. After surgery it may be brought to your room.  For patients admitted to the hospital, discharge time is determined by your treatment  team.    Patients discharged the day of surgery will not be allowed to drive home.  You will need someone to drive you home and stay with you the night of your procedure.    Please read over the following fact sheets that you were given:   [facility] Preparing for Surgery and or MRSA Information   _x___ Take anti-hypertensive listed below, cardiac, seizure, asthma, anti-reflux and psychiatric medicines. These include:  1. Tamsulosin/Flomax  2. Percocet if needed for pain  _x___ Use CHG Soap or sage wipes as directed on instruction sheet   _x___ Stop Metformin and Janumet 2 days prior to surgery, Saturday January 31, 2018.    _x___ Follow recommendations from Cardiologist, Pulmonologist or PCP regarding stopping Aspirin, Coumadin, Plavix ,Eliquis, Effient, or Pradaxa, and Pletal.  _x___Stop Anti-inflammatories such as Advil, Aleve, Ibuprofen, Motrin, Naproxen, Naprosyn, Goodies powders or aspirin products. OK to take Tylenol and Celebrex.   _x___ Stop supplements until after surgery.  But may continue Vitamin D, Vitamin B, and multivitamin.   ____ Bring C-Pap to the hospital.

## 2018-01-29 LAB — URINE CULTURE: CULTURE: NO GROWTH

## 2018-02-01 MED ORDER — DEXTROSE 5 % IV SOLN
3.0000 g | INTRAVENOUS | Status: AC
  Administered 2018-02-02: 3 g via INTRAVENOUS
  Administered 2018-02-02: 1 g via INTRAVENOUS
  Filled 2018-02-01: qty 3000

## 2018-02-01 MED ORDER — CIPROFLOXACIN IN D5W 400 MG/200ML IV SOLN
400.0000 mg | INTRAVENOUS | Status: AC
  Administered 2018-02-02: 400 mg via INTRAVENOUS

## 2018-02-02 ENCOUNTER — Encounter
Admission: RE | Disposition: A | Discharge status: Home / Self Care | Payer: Self-pay | Source: Ambulatory Visit | Attending: Urology

## 2018-02-02 ENCOUNTER — Encounter: Payer: Self-pay | Admitting: *Deleted

## 2018-02-02 ENCOUNTER — Ambulatory Visit: Payer: 59

## 2018-02-02 ENCOUNTER — Ambulatory Visit: Payer: 59 | Admitting: Anesthesiology

## 2018-02-02 ENCOUNTER — Other Ambulatory Visit: Payer: Self-pay

## 2018-02-02 ENCOUNTER — Ambulatory Visit
Admission: RE | Admit: 2018-02-02 | Discharge: 2018-02-02 | Disposition: A | Discharge status: Home / Self Care | Payer: 59 | Source: Ambulatory Visit | Attending: General Surgery | Admitting: General Surgery

## 2018-02-02 DIAGNOSIS — Z803 Family history of malignant neoplasm of breast: Secondary | ICD-10-CM | POA: Diagnosis not present

## 2018-02-02 DIAGNOSIS — K439 Ventral hernia without obstruction or gangrene: Secondary | ICD-10-CM

## 2018-02-02 DIAGNOSIS — E119 Type 2 diabetes mellitus without complications: Secondary | ICD-10-CM | POA: Diagnosis not present

## 2018-02-02 DIAGNOSIS — E890 Postprocedural hypothyroidism: Secondary | ICD-10-CM | POA: Diagnosis not present

## 2018-02-02 DIAGNOSIS — N201 Calculus of ureter: Secondary | ICD-10-CM | POA: Diagnosis not present

## 2018-02-02 DIAGNOSIS — K420 Umbilical hernia with obstruction, without gangrene: Secondary | ICD-10-CM | POA: Insufficient documentation

## 2018-02-02 DIAGNOSIS — Z880 Allergy status to penicillin: Secondary | ICD-10-CM | POA: Insufficient documentation

## 2018-02-02 DIAGNOSIS — Z801 Family history of malignant neoplasm of trachea, bronchus and lung: Secondary | ICD-10-CM | POA: Insufficient documentation

## 2018-02-02 DIAGNOSIS — Z87442 Personal history of urinary calculi: Secondary | ICD-10-CM | POA: Insufficient documentation

## 2018-02-02 DIAGNOSIS — Z800 Family history of malignant neoplasm of digestive organs: Secondary | ICD-10-CM | POA: Insufficient documentation

## 2018-02-02 DIAGNOSIS — Z419 Encounter for procedure for purposes other than remedying health state, unspecified: Secondary | ICD-10-CM

## 2018-02-02 DIAGNOSIS — Z823 Family history of stroke: Secondary | ICD-10-CM | POA: Diagnosis not present

## 2018-02-02 DIAGNOSIS — E669 Obesity, unspecified: Secondary | ICD-10-CM | POA: Insufficient documentation

## 2018-02-02 DIAGNOSIS — Z833 Family history of diabetes mellitus: Secondary | ICD-10-CM | POA: Diagnosis not present

## 2018-02-02 DIAGNOSIS — Z6841 Body Mass Index (BMI) 40.0 and over, adult: Secondary | ICD-10-CM | POA: Diagnosis not present

## 2018-02-02 DIAGNOSIS — G709 Myoneural disorder, unspecified: Secondary | ICD-10-CM | POA: Diagnosis not present

## 2018-02-02 HISTORY — DX: Failed or difficult intubation, initial encounter: T88.4XXA

## 2018-02-02 HISTORY — PX: VENTRAL HERNIA REPAIR: SHX424

## 2018-02-02 HISTORY — PX: CYSTOSCOPY/URETEROSCOPY/HOLMIUM LASER/STENT PLACEMENT: SHX6546

## 2018-02-02 LAB — GLUCOSE, CAPILLARY
GLUCOSE-CAPILLARY: 143 mg/dL — AB (ref 65–99)
Glucose-Capillary: 120 mg/dL — ABNORMAL HIGH (ref 65–99)

## 2018-02-02 SURGERY — CYSTOSCOPY/URETEROSCOPY/HOLMIUM LASER/STENT PLACEMENT
Anesthesia: General | Site: Ureter | Wound class: Clean Contaminated

## 2018-02-02 MED ORDER — FAMOTIDINE 20 MG PO TABS
ORAL_TABLET | ORAL | Status: AC
  Filled 2018-02-02: qty 1

## 2018-02-02 MED ORDER — CIPROFLOXACIN IN D5W 400 MG/200ML IV SOLN
INTRAVENOUS | Status: AC
  Filled 2018-02-02: qty 200

## 2018-02-02 MED ORDER — FENTANYL CITRATE (PF) 100 MCG/2ML IJ SOLN: 25.0000 ug | INTRAMUSCULAR | Status: DC | PRN

## 2018-02-02 MED ORDER — ONDANSETRON HCL 4 MG/2ML IJ SOLN
INTRAMUSCULAR | Status: DC | PRN
  Administered 2018-02-02: 4 mg via INTRAVENOUS

## 2018-02-02 MED ORDER — GLYCOPYRROLATE 0.2 MG/ML IJ SOLN
INTRAMUSCULAR | Status: AC
  Filled 2018-02-02: qty 1

## 2018-02-02 MED ORDER — LIDOCAINE HCL (PF) 2 % IJ SOLN
INTRAMUSCULAR | Status: AC
  Filled 2018-02-02: qty 10

## 2018-02-02 MED ORDER — PROPOFOL 10 MG/ML IV BOLUS
INTRAVENOUS | Status: DC | PRN
  Administered 2018-02-02: 150 mg via INTRAVENOUS
  Administered 2018-02-02: 50 mg via INTRAVENOUS

## 2018-02-02 MED ORDER — DOCUSATE SODIUM 100 MG PO CAPS: 100.0000 mg | ORAL_CAPSULE | Freq: Two times a day (BID) | ORAL | 0 refills | Status: DC

## 2018-02-02 MED ORDER — BUPIVACAINE HCL (PF) 0.5 % IJ SOLN
INTRAMUSCULAR | Status: DC | PRN
  Administered 2018-02-02: 30 mL

## 2018-02-02 MED ORDER — MIDAZOLAM HCL 2 MG/2ML IJ SOLN
INTRAMUSCULAR | Status: AC
  Filled 2018-02-02: qty 4

## 2018-02-02 MED ORDER — FAMOTIDINE 20 MG PO TABS
20.0000 mg | ORAL_TABLET | Freq: Once | ORAL | Status: AC
  Administered 2018-02-02: 20 mg via ORAL

## 2018-02-02 MED ORDER — HYDROMORPHONE HCL 1 MG/ML IJ SOLN
INTRAMUSCULAR | Status: DC | PRN
  Administered 2018-02-02 (×2): 1 mg via INTRAVENOUS

## 2018-02-02 MED ORDER — ROCURONIUM BROMIDE 50 MG/5ML IV SOLN
INTRAVENOUS | Status: AC
  Filled 2018-02-02: qty 2

## 2018-02-02 MED ORDER — IOTHALAMATE MEGLUMINE 43 % IV SOLN
INTRAVENOUS | Status: DC | PRN
  Administered 2018-02-02: 30 mL via URETHRAL

## 2018-02-02 MED ORDER — ACETAMINOPHEN NICU IV SYRINGE 10 MG/ML
INTRAVENOUS | Status: AC
  Filled 2018-02-02: qty 1

## 2018-02-02 MED ORDER — ONDANSETRON HCL 4 MG/2ML IJ SOLN
INTRAMUSCULAR | Status: AC
  Filled 2018-02-02: qty 2

## 2018-02-02 MED ORDER — ROCURONIUM BROMIDE 100 MG/10ML IV SOLN
INTRAVENOUS | Status: DC | PRN
  Administered 2018-02-02: 20 mg via INTRAVENOUS
  Administered 2018-02-02: 50 mg via INTRAVENOUS

## 2018-02-02 MED ORDER — OXYBUTYNIN CHLORIDE 5 MG PO TABS: 5.0000 mg | ORAL_TABLET | Freq: Three times a day (TID) | ORAL | 0 refills | Status: DC | PRN

## 2018-02-02 MED ORDER — LIDOCAINE HCL (CARDIAC) 20 MG/ML IV SOLN
INTRAVENOUS | Status: DC | PRN
  Administered 2018-02-02: 20 mg via INTRAVENOUS
  Administered 2018-02-02: 50 mg via INTRAVENOUS
  Administered 2018-02-02: 20 mg via INTRAVENOUS

## 2018-02-02 MED ORDER — SUGAMMADEX SODIUM 500 MG/5ML IV SOLN
INTRAVENOUS | Status: DC | PRN
  Administered 2018-02-02: 300 mg via INTRAVENOUS

## 2018-02-02 MED ORDER — GABAPENTIN 300 MG PO CAPS
ORAL_CAPSULE | ORAL | Status: AC
  Administered 2018-02-02: 300 mg
  Filled 2018-02-02: qty 1

## 2018-02-02 MED ORDER — OXYCODONE-ACETAMINOPHEN 5-325 MG PO TABS: 1.0000 | ORAL_TABLET | Freq: Four times a day (QID) | ORAL | 0 refills | Status: DC | PRN

## 2018-02-02 MED ORDER — KETOROLAC TROMETHAMINE 30 MG/ML IJ SOLN
INTRAMUSCULAR | Status: DC | PRN
  Administered 2018-02-02: 30 mg via INTRAVENOUS

## 2018-02-02 MED ORDER — LABETALOL HCL 5 MG/ML IV SOLN
INTRAVENOUS | Status: DC | PRN
  Administered 2018-02-02: 5 mg via INTRAVENOUS

## 2018-02-02 MED ORDER — KETAMINE HCL 10 MG/ML IJ SOLN
INTRAMUSCULAR | Status: DC | PRN
  Administered 2018-02-02 (×2): 40 mg via INTRAVENOUS
  Administered 2018-02-02: 20 mg via INTRAVENOUS

## 2018-02-02 MED ORDER — SODIUM CHLORIDE 0.9 % IV SOLN
INTRAVENOUS | Status: DC
  Administered 2018-02-02 (×2): via INTRAVENOUS

## 2018-02-02 MED ORDER — HYDROMORPHONE HCL 1 MG/ML IJ SOLN
INTRAMUSCULAR | Status: AC
  Filled 2018-02-02: qty 2

## 2018-02-02 MED ORDER — GABAPENTIN 300 MG PO CAPS: 300.0000 mg | ORAL_CAPSULE | ORAL | Status: DC

## 2018-02-02 MED ORDER — GLYCOPYRROLATE 0.2 MG/ML IJ SOLN
INTRAMUSCULAR | Status: DC | PRN
  Administered 2018-02-02: 0.1 mg via INTRAVENOUS

## 2018-02-02 MED ORDER — OXYCODONE HCL 5 MG PO TABS: 5.0000 mg | ORAL_TABLET | Freq: Once | ORAL | Status: DC | PRN

## 2018-02-02 MED ORDER — MIDAZOLAM HCL 2 MG/2ML IJ SOLN
INTRAMUSCULAR | Status: DC | PRN
  Administered 2018-02-02 (×2): 2 mg via INTRAVENOUS

## 2018-02-02 MED ORDER — BUPIVACAINE HCL (PF) 0.5 % IJ SOLN
INTRAMUSCULAR | Status: AC
  Filled 2018-02-02: qty 30

## 2018-02-02 MED ORDER — OXYCODONE-ACETAMINOPHEN 5-325 MG PO TABS
ORAL_TABLET | ORAL | Status: AC
  Administered 2018-02-02: 1
  Filled 2018-02-02: qty 1

## 2018-02-02 MED ORDER — ACETAMINOPHEN 10 MG/ML IV SOLN
INTRAVENOUS | Status: DC | PRN
  Administered 2018-02-02: 1000 mg via INTRAVENOUS

## 2018-02-02 MED ORDER — SUGAMMADEX SODIUM 500 MG/5ML IV SOLN
INTRAVENOUS | Status: AC
  Filled 2018-02-02: qty 5

## 2018-02-02 MED ORDER — LABETALOL HCL 5 MG/ML IV SOLN
INTRAVENOUS | Status: AC
  Filled 2018-02-02: qty 4

## 2018-02-02 MED ORDER — OXYCODONE HCL 5 MG/5ML PO SOLN: 5.0000 mg | Freq: Once | ORAL | Status: DC | PRN

## 2018-02-02 SURGICAL SUPPLY — 74 items
BAG DRAIN CYSTO-URO LG1000N (MISCELLANEOUS) ×4 IMPLANT
BASKET ZERO TIP 1.9FR (BASKET) ×4 IMPLANT
BENZOIN TINCTURE PRP APPL 2/3 (GAUZE/BANDAGES/DRESSINGS) ×4 IMPLANT
BLADE SURG 11 STRL SS SAFETY (MISCELLANEOUS) ×4 IMPLANT
BRUSH SCRUB EZ 1% IODOPHOR (MISCELLANEOUS) ×4 IMPLANT
CANISTER SUCT 1200ML W/VALVE (MISCELLANEOUS) ×4 IMPLANT
CANNULA DILATOR  5MM W/SLV (CANNULA) ×1
CANNULA DILATOR 10 W/SLV (CANNULA) ×6 IMPLANT
CANNULA DILATOR 10MM W/SLV (CANNULA) ×2
CANNULA DILATOR 5 W/SLV (CANNULA) ×3 IMPLANT
CATH URETL 5X70 OPEN END (CATHETERS) ×4 IMPLANT
CHLORAPREP W/TINT 26ML (MISCELLANEOUS) ×4 IMPLANT
CLOSURE WOUND 1/2 X4 (GAUZE/BANDAGES/DRESSINGS) ×2
CNTNR SPEC 2.5X3XGRAD LEK (MISCELLANEOUS) ×2
CONRAY 43 FOR UROLOGY 50M (MISCELLANEOUS) ×4 IMPLANT
CONT SPEC 4OZ STER OR WHT (MISCELLANEOUS) ×2
CONTAINER SPEC 2.5X3XGRAD LEK (MISCELLANEOUS) ×2 IMPLANT
CORD SCALPEL HARMONIC (MISCELLANEOUS) ×2 IMPLANT
DEVICE SECURE STRAP 25 ABSORB (INSTRUMENTS) ×16 IMPLANT
DISSECTOR KITTNER STICK (MISCELLANEOUS) ×2 IMPLANT
DISSECTORS/KITTNER STICK (MISCELLANEOUS) ×4
DRAPE UTILITY 15X26 TOWEL STRL (DRAPES) ×4 IMPLANT
DRSG TEGADERM 2-3/8X2-3/4 SM (GAUZE/BANDAGES/DRESSINGS) ×16 IMPLANT
DRSG TELFA 4X3 1S NADH ST (GAUZE/BANDAGES/DRESSINGS) ×8 IMPLANT
ELECT REM PT RETURN 9FT ADLT (ELECTROSURGICAL) ×4
ELECTRODE REM PT RTRN 9FT ADLT (ELECTROSURGICAL) ×2 IMPLANT
FIBER LASER LITHO 273 (Laser) ×4 IMPLANT
GLOVE BIO SURGEON STRL SZ 6.5 (GLOVE) ×6 IMPLANT
GLOVE BIO SURGEON STRL SZ7.5 (GLOVE) ×16 IMPLANT
GLOVE BIO SURGEONS STRL SZ 6.5 (GLOVE) ×2
GLOVE INDICATOR 8.0 STRL GRN (GLOVE) ×4 IMPLANT
GOWN STRL REUS W/ TWL LRG LVL3 (GOWN DISPOSABLE) ×8 IMPLANT
GOWN STRL REUS W/TWL LRG LVL3 (GOWN DISPOSABLE) ×8
GRASPER SUT TROCAR 14GX15 (MISCELLANEOUS) ×4 IMPLANT
GUIDEWIRE GREEN .038 145CM (MISCELLANEOUS) ×4 IMPLANT
HARMONIC SCALPEL CORD (MISCELLANEOUS) ×4
INFUSOR MANOMETER BAG 3000ML (MISCELLANEOUS) ×4 IMPLANT
INTRODUCER DILATOR DOUBLE (INTRODUCER) IMPLANT
IRRIGATION STRYKERFLOW (MISCELLANEOUS) ×2 IMPLANT
IRRIGATOR STRYKERFLOW (MISCELLANEOUS) ×4
IV LACTATED RINGERS 1000ML (IV SOLUTION) ×4 IMPLANT
KIT TURNOVER CYSTO (KITS) ×4 IMPLANT
KIT TURNOVER KIT A (KITS) ×4 IMPLANT
LABEL OR SOLS (LABEL) ×4 IMPLANT
LIGASURE LAP MARYLAND 5MM 37CM (ELECTROSURGICAL) IMPLANT
MESH VENT LT ST 10.2X15.2CM EL (Mesh General) ×4 IMPLANT
NDL INSUFF ACCESS 14 VERSASTEP (NEEDLE) ×4 IMPLANT
NS IRRIG 500ML POUR BTL (IV SOLUTION) ×4 IMPLANT
PACK CYSTO AR (MISCELLANEOUS) ×4 IMPLANT
PACK LAP CHOLECYSTECTOMY (MISCELLANEOUS) ×8 IMPLANT
SCISSORS METZENBAUM CVD 33 (INSTRUMENTS) ×4 IMPLANT
SEAL FOR SCOPE WARMER C3101 (MISCELLANEOUS) IMPLANT
SENSORWIRE 0.038 NOT ANGLED (WIRE) ×4
SET CYSTO W/LG BORE CLAMP LF (SET/KITS/TRAYS/PACK) ×4 IMPLANT
SHEARS HARMONIC ACE PLUS 36CM (ENDOMECHANICALS) ×4 IMPLANT
SHEATH URETERAL 12FRX35CM (MISCELLANEOUS) ×4 IMPLANT
SLEEVE VERSASTEP EXPAND ONEST (MISCELLANEOUS) ×4 IMPLANT
SOL .9 NS 3000ML IRR  AL (IV SOLUTION) ×2
SOL .9 NS 3000ML IRR UROMATIC (IV SOLUTION) ×2 IMPLANT
STENT URET 6FRX24 CONTOUR (STENTS) IMPLANT
STENT URET 6FRX26 CONTOUR (STENTS) ×4 IMPLANT
STRIP CLOSURE SKIN 1/2X4 (GAUZE/BANDAGES/DRESSINGS) ×6 IMPLANT
SURGILUBE 2OZ TUBE FLIPTOP (MISCELLANEOUS) ×4 IMPLANT
SUT MAXON 0 T 12 3 (SUTURE) ×12 IMPLANT
SUT PDS PLUS 0 (SUTURE)
SUT PDS PLUS AB 0 CT-2 (SUTURE) IMPLANT
SUT VIC AB 0 SH 27 (SUTURE) ×4 IMPLANT
SUT VIC AB 4-0 FS2 27 (SUTURE) ×4 IMPLANT
SWABSTK COMLB BENZOIN TINCTURE (MISCELLANEOUS) ×4 IMPLANT
TRAY FOLEY W/METER SILVER 16FR (SET/KITS/TRAYS/PACK) IMPLANT
TROCAR XCEL UNIV SLVE 11M 100M (ENDOMECHANICALS) ×4 IMPLANT
TUBING INSUFFLATION (TUBING) ×4 IMPLANT
WATER STERILE IRR 1000ML POUR (IV SOLUTION) ×4 IMPLANT
WIRE SENSOR 0.038 NOT ANGLED (WIRE) ×2 IMPLANT

## 2018-02-02 NOTE — Transfer of Care (Signed)
Immediate Anesthesia Transfer of Care Note  Patient: Jorge Boyle  Procedure(s) Performed: CYSTOSCOPY/URETEROSCOPY/HOLMIUM LASER/STENT PLACEMENT (Left Ureter) LAPAROSCOPIC VENTRAL HERNIA (N/A Abdomen)  Patient Location: PACU  Anesthesia Type:General  Level of Consciousness: sedated  Airway & Oxygen Therapy: Patient Spontanous Breathing  Post-op Assessment: Post -op Vital signs reviewed and stable  Post vital signs: stable  Last Vitals:  Vitals Value Taken Time  BP 163/96 02/02/2018  6:43 PM  Temp 36.6 C 02/02/2018  6:43 PM  Pulse 107 02/02/2018  6:45 PM  Resp 22 02/02/2018  6:45 PM  SpO2 98 % 02/02/2018  6:45 PM  Vitals shown include unvalidated device data.  Last Pain:  Vitals:   02/02/18 1843  TempSrc: Temporal         Complications: No apparent anesthesia complications

## 2018-02-02 NOTE — Interval H&P Note (Signed)
History and Physical Interval Note:  02/02/2018 1:33 PM  Jorge Boyle  has presented today for surgery, with the diagnosis of left ureteral stone  The various methods of treatment have been discussed with the patient and family. After consideration of risks, benefits and other options for treatment, the patient has consented to  Procedure(s): CYSTOSCOPY/URETEROSCOPY/HOLMIUM LASER/STENT PLACEMENT (Left) LAPAROSCOPIC VENTRAL HERNIA (N/A) as a surgical intervention .  The patient's history has been reviewed, patient examined, no change in status, stable for surgery.  I have reviewed the patient's chart and labs.  Questions were answered to the patient's satisfaction.    RRR CTAB  Hollice Espy

## 2018-02-02 NOTE — Op Note (Signed)
Preoperative diagnosis: Recurrent umbilical hernia with the incarcerated omentum.  Postoperative diagnosis: Same.  Operative procedure: Laparoscopic repair of umbilical hernia with 4 x 6 inch Bard ventral X ST mesh.  Operating Surgeon: Hervey Ard, MD.  Anesthesia: General endotracheal, Marcaine 0.5%, plain: 30 cc.  Estimated blood loss: 5 cc.  Clinical note: This 42 year old male had undergone primary repair of a 1 cm fascial defect in the past.  He has developed a recurrent hernia with incarcerated omentum.  He underwent cystoscopy and stone extraction earlier today without evidence of purulent material.  He is felt to be a candidate for laparoscopic repair and prosthetic mesh placement.  The patient was redosed with 1 g of Kefzol having previously received antibiotics at the beginning of his stone/stent procedure.  The abdomen had been clipped of hair.  Skin was cleansed with ChloraPrep.  The first port was placed below the costal margin along the anterior axillary line.  A small incision was made a varies needle passed.  After assuring intra-abdominal location with a hanging drop test the abdomen was insufflated with CO2 at 10 mmHg pressure.  A 5 mm Step port was expanded.  It was possible to advance out of the omentum and into the peritoneal cavity without evidence of injury.  There were a few adhesions to the anterior abdominal wall except for the omentum was incarcerated in the recurrent umbilical hernia.  2 additional 10 mm Step ports were placed under direct vision along the anterior axillary line on the left side.  Using gentle traction and cautery dissection the omentum was extracted from the fascial defect which measured about a centimeter and a half in diameter.  This is slightly larger than at the time of his initial repair.  Hemostasis was with electrocautery.  No evidence of bowel entrapment.  The anterior surface of the abdominal wall was cleared of a few adhesions and 2-0 Maxon  trans-fascial sutures were used to approximate the midline fascia at the umbilicus.  A 4 x 6 inch Bard ventral lite mesh was then brought into the abdominal cavity through the 10 mm port site.ST 4 corner sutures of 0 Maxon the previously been placed.  The balloon assembly was brought through the umbilicus making use of the TMI suture passer, the balloon inflated and the mesh brought up to the anterior abdominal wall.  4 corner sutures were then placed under direct vision making use of 0 Maxon.  After this was completed the Ethicon secure strap tach was used circumferentially around the edge of the mesh to prevent exposure of the polypropylene surface.  To this tacks needed to be removed as they did not deploy completely.  Additional rows of tacks were used to adhere the mesh firmly to the anterior abdominal wall.  Good hemostasis was noted.  In spite of his generous girth, the omentum was primarily in the upper abdomen and did not easily distract down to cover the surgical site.  Good hemostasis was noted, the area was irrigated and the 2-10 mm port sites were closed with 0 Maxon sutures making use of the "cone" and TIMI suture passer.  After desufflated the abdomen skin incisions were closed with 4-0 Vicryl subcuticular sutures.  Benzoin and Steri-Strips followed by Telfa and Tegaderm dressings at the port site were applied.  Benzoin Steri-Strips were applied to the trans-fascial suture sites.  Telfa and an ABD pad was then applied to the midportion of the abdomen.  The patient underwent an excellent extubation while "deep" with no  coughing noted.  The patient was transported to recovery room in stable condition.

## 2018-02-02 NOTE — Op Note (Signed)
Date of procedure: 02/02/18  Preoperative diagnosis:  1. Left proximal ureteral stone  Postoperative diagnosis:  1. Same as above  Procedure: 1. Left ureteroscopy 2. Laser lithotripsy 3. Left retrograde pyelogram 4. Left ureteral stent placement 5. Basket extraction of stone fragment  Surgeon: Hollice Espy, MD  Anesthesia: General  Complications: None  Intraoperative findings: 8 mm stone lodged in unchanged position within left proximal ureter with surrounding edema/inflammation.  Stone successfully fragmented and removed.  Stent placed.  EBL: Minimal  Specimens: Stone fragment  Drains: 6 x 26 French double-J ureteral stent on left  Indication: Jorge Boyle is a 42 y.o. patient with retained 8 mm left proximal ureteral stone.  After reviewing the management options for treatment, he elected to proceed with the above surgical procedure(s). We have discussed the potential benefits and risks of the procedure, side effects of the proposed treatment, the likelihood of the patient achieving the goals of the procedure, and any potential problems that might occur during the procedure or recuperation. Informed consent has been obtained.  Description of procedure:  The patient was taken to the operating room and general anesthesia was induced.  The patient was placed in the dorsal lithotomy position, prepped and draped in the usual sterile fashion, and preoperative antibiotics were administered. A preoperative time-out was performed.   A 21 French scope was advanced per urethra into the bladder.  Attention was turned to the left ureteral orifice from which clear reflux of urine was able to be seen.  On scout imaging, the stone was faintly identified in the left proximal ureter.  A gentle retrograde pyelogram was then performed revealing a small filling defect at this level with proximal mild hydroureteronephrosis down to this level.  A sensor wire was able to be placed alongside the  stone up to level the kidney without difficulty.  This was snapped in place as a safety wire.  I then attempted to advance a 4.5 French semirigid ureteroscope but was not able to advance as much beyond the distal ureter.  A second super stiff wire was then advanced to level of the stone where it coiled.  I then used a 35 cm ureteral access sheath to the level of the mid ureter over the Super Stiff wire in order to facilitate basketing and ureteroscopic manipulation.  An 8 French dual-lumen ureteroscope was then advanced to the level of the stone which with manipulation, became dislodged and refluxed up into an upper pole calyx.  A 273 m laser fiber was then brought in and using the settings of 0.8 J and 10 Hz, the stone was fragmented into approximately 15 very small pieces.  Each of these pieces were then extracted using 1.9 [place] tipless nitinol basket.  Once all significant stone fragment was removed, each every calyx was directly visualized and no significant residual stone burden remained.  A final retrograde pyelogram showed no extravasation of contrast material and create a roadmap.  The scope was then backed down the length the ureter inspecting along the way.  One small ureteral fragment was removed without difficulty.  The scope was then removed.  The safety wire was then backloaded over rigid cystoscope.  A 6 x 26 French double-J ureteral stent was then advanced up to level the renal pelvis.  The wires partially drawn until focal is noted in the renal pelvis and fully withdrawn full coils noted within the bladder.  The bladder was then drained and the scope was removed.  Patient was then clean  and dry, repositioned in supine position.  At this point in time, the procedure was turned over to Dr. Tollie Pizza to complete his ventral hernia repair.  Plan: Patient will be scheduled for cystoscopy, stent removal next week.  Hollice Espy, M.D.

## 2018-02-02 NOTE — Discharge Instructions (Signed)
Indwelling Urinary Catheter Care, Adult Take good care of your catheter to keep it working and to prevent problems. How to wear your catheter Attach your catheter to your leg with tape (adhesive tape) or a leg strap. Make sure it is not too tight. If you use tape, remove any bits of tape that are already on the catheter. How to wear a drainage bag You should have:  A large overnight bag.  A small leg bag.  Overnight Bag You may wear the overnight bag at any time. Always keep the bag below the level of your bladder but off the floor. When you sleep, put a clean plastic bag in a wastebasket. Then hang the bag inside the wastebasket. Leg Bag Never wear the leg bag at night. Always wear the leg bag below your knee. Keep the leg bag secure with a leg strap or tape. How to care for your skin  Clean the skin around the catheter at least once every day.  Shower every day. Do not take baths.  Put creams, lotions, or ointments on your genital area only as told by your doctor.  Do not use powders, sprays, or lotions on your genital area. How to clean your catheter and your skin 1. Wash your hands with soap and water. 2. Wet a washcloth in warm water and gentle (mild) soap. 3. Use the washcloth to clean the skin where the catheter enters your body. Clean downward and wipe away from the catheter in small circles. Do not wipe toward the catheter. 4. Pat the area dry with a clean towel. Make sure to clean off all soap. How to care for your drainage bags Empty your drainage bag when it is ?- full or at least 2-3 times a day. Replace your drainage bag once a month or sooner if it starts to smell bad or look dirty. Do not clean your drainage bag unless told by your doctor. Emptying a drainage bag  Supplies Needed  Rubbing alcohol.  Gauze pad or cotton ball.  Tape or a leg strap.  Steps 1. Wash your hands with soap and water. 2. Separate (detach) the bag from your leg. 3. Hold the  bag over the toilet or a clean container. Keep the bag below your hips and bladder. This stops pee (urine) from going back into the tube. 4. Open the pour spout at the bottom of the bag. 5. Empty the pee into the toilet or container. Do not let the pour spout touch any surface. 6. Put rubbing alcohol on a gauze pad or cotton ball. 7. Use the gauze pad or cotton ball to clean the pour spout. 8. Close the pour spout. 9. Attach the bag to your leg with tape or a leg strap. 10. Wash your hands.  Changing a drainage bag Supplies Needed  Alcohol wipes.  A clean drainage bag.  Adhesive tape or a leg strap.  Steps 1. Wash your hands with soap and water. 2. Separate the dirty bag from your leg. 3. Pinch the rubber catheter with your fingers so that pee does not spill out. 4. Separate the catheter tube from the drainage tube where these tubes connect (at the connection valve). Do not let the tubes touch any surface. 5. Clean the end of the catheter tube with an alcohol wipe. Use a different alcohol wipe to clean the end of the drainage tube. 6. Connect the catheter tube to the drainage tube of the clean bag. 7. Attach  the new bag to the leg with adhesive tape or a leg strap. 8. Wash your hands.  How to prevent infection and other problems  Never pull on your catheter or try to remove it. Pulling can damage tissue in your body.  Always wash your hands before and after touching your catheter.  If a leg strap gets wet, replace it with a dry one.  Drink enough fluids to keep your pee clear or pale yellow, or as told by your doctor.  Do not let the drainage bag or tubing touch the floor.  Wear cotton underwear.  If you are male, wipe from front to back after you poop (have a bowel movement).  Check on the catheter often to make sure it works and the tubing is not twisted. Get help if:  Your pee is cloudy.  Your pee smells unusually bad.  Your pee is not draining into the  bag.  Your tube gets clogged.  Your catheter starts to leak.  Your bladder feels full. Get help right away if:  You have redness, swelling, or pain where the catheter enters your body.  You have fluid, pus, or a bad smell coming from the area where the catheter enters your body.  The area where the catheter enters your body feels warm.  You have a fever.  You have pain in your: ? Stomach (abdomen). ? Legs. ? Lower back. ? Bladder.  You see blood fill the catheter.  Your pee is pink or red.  You feel sick to your stomach (nauseous).  You throw up (vomit).  You have chills.  Your catheter gets pulled out. This information is not intended to replace advice given to you by your health care provider. Make sure you discuss any questions you have with your health care provider. Document Released: 02/01/2013 Document Revised: 09/04/2016 Document Reviewed: 03/22/2014 Elsevier Interactive Patient Education  2018 [place]   1) The drugs that you were given will stay in your system until tomorrow so for the next 24 hours you should not:  A) Drive an automobile B) Make any legal decisions C) Drink any alcoholic beverage   2) You may resume regular meals tomorrow.  Today it is better to start with liquids and gradually work up to solid foods.  You may eat anything you prefer, but it is better to start with liquids, then soup and crackers, and gradually work up to solid foods.   3) Please notify your doctor immediately if you have any unusual bleeding, trouble breathing, redness and pain at the surgery site, drainage, fever, or pain not relieved by medication. 4)   5) Your post-operative visit with Dr.                                     is: Date:                        Time:    Please call to schedule your post-operative visit.  6) Additional Instructions:      You have a ureteral stent in place.  This is a tube  that extends from your kidney to your bladder.  This may cause urinary bleeding, burning with urination, and urinary frequency.  Please call our office or present to the ED if you develop fevers >101 or pain which is not able to be  controlled with oral pain medications.  You may be given either Flomax and/ or ditropan to help with bladder spasms and stent pain in addition to pain medications.    [facility] [address] [place], [place] [address] [phone]

## 2018-02-02 NOTE — Anesthesia Preprocedure Evaluation (Signed)
Anesthesia Evaluation  Patient identified by MRN, date of birth, ID band Patient awake    Reviewed: Allergy & Precautions, H&P , NPO status , Patient's Chart, lab work & pertinent test results  History of Anesthesia Complications Negative for: history of anesthetic complications  Airway Mallampati: III  TM Distance: >3 FB Neck ROM: full    Dental  (+) Chipped, Poor Dentition   Pulmonary neg shortness of breath,           Cardiovascular Exercise Tolerance: Good (-) angina(-) Past MI and (-) DOE negative cardio ROS       Neuro/Psych  Headaches,  Neuromuscular disease negative psych ROS   GI/Hepatic negative GI ROS, Neg liver ROS,   Endo/Other  diabetes, Type 2  Renal/GU      Musculoskeletal   Abdominal   Peds  Hematology negative hematology ROS (+)   Anesthesia Other Findings Past Medical History: No date: Allergic rhinitis No date: Diabetes mellitus without complication ([place]) No date: Headache No date: History of chicken pox No date: History of kidney stones 01/2012: Multiple thyroid nodules     Comment:  left, biopsy x2 benign, rpt [place] stable (01/2013, 07/2015)               rec rpt 1 yr No date: Nontoxic nodular goiter No date: Obesity No date: Prediabetes 2005: S/P excision of thyroid adenoma     Comment:  R thyroidectomy  Past Surgical History: 2005: THYROIDECTOMY, PARTIAL     Comment:  Right and isthmus. Dr Nadeen Landau 1985: [place] [id number]: UMBILICAL HERNIA REPAIR     Comment:  [place]  BMI    Body Mass Index:  42.04 kg/m      Reproductive/Obstetrics negative OB ROS                             Anesthesia Physical Anesthesia Plan  ASA: III  Anesthesia Plan: General ETT   Post-op Pain Management:    Induction: Intravenous  PONV Risk Score and Plan: Ondansetron, Dexamethasone and Midazolam  Airway Management Planned: Oral  ETT  Additional Equipment:   Intra-op Plan:   Post-operative Plan: Extubation in OR  Informed Consent: I have reviewed the patients History and Physical, chart, labs and discussed the procedure including the risks, benefits and alternatives for the proposed anesthesia with the patient or authorized representative who has indicated his/her understanding and acceptance.   Dental Advisory Given  Plan Discussed with: Anesthesiologist, CRNA and Surgeon  Anesthesia Plan Comments: (Patient consented for risks of anesthesia including but not limited to:  - adverse reactions to medications - damage to teeth, lips or other oral mucosa - sore throat or hoarseness - Damage to heart, brain, lungs or loss of life  Patient voiced understanding.)        Anesthesia Quick Evaluation

## 2018-02-02 NOTE — H&P (Signed)
No interval change. For stone removal/ stent followed by lap repair of recurrent umbilical hernia.

## 2018-02-02 NOTE — Anesthesia Procedure Notes (Addendum)
Procedure Name: Intubation Date/Time: 02/02/2018 3:26 PM Performed by: Nile Riggs, CRNA Pre-anesthesia Checklist: Patient identified, Emergency Drugs available, Patient being monitored, Timeout performed and Suction available Patient Re-evaluated:Patient Re-evaluated prior to induction Oxygen Delivery Method: Circle system utilized Preoxygenation: Pre-oxygenation with 100% oxygen Induction Type: IV induction Ventilation: Mask ventilation with difficulty and Oral airway inserted - appropriate to patient size Laryngoscope Size: McGraph and 2 Grade View: Grade I Tube type: Oral Tube size: 7.5 mm Number of attempts: 2 Airway Equipment and Method: Stylet and Video-laryngoscopy Placement Confirmation: ETT inserted through vocal cords under direct vision,  positive ETCO2,  CO2 detector and breath sounds checked- equal and bilateral Secured at: 22 cm Tube secured with: Tape Dental Injury: Teeth and Oropharynx as per pre-operative assessment  Difficulty Due To: Difficulty was unanticipated, Difficult Airway- due to large tongue, Difficult Airway- due to reduced neck mobility and Difficult Airway- due to anterior larynx

## 2018-02-02 NOTE — Anesthesia Post-op Follow-up Note (Signed)
Anesthesia QCDR form completed.        

## 2018-02-03 ENCOUNTER — Telehealth: Payer: Self-pay | Admitting: *Deleted

## 2018-02-03 ENCOUNTER — Encounter: Payer: Self-pay | Admitting: Urology

## 2018-02-03 ENCOUNTER — Telehealth: Payer: Self-pay | Admitting: Urology

## 2018-02-03 NOTE — Telephone Encounter (Signed)
The patient's discharge instructions hopefully included a request that he contact the office for an appointment the week of April 29.

## 2018-02-03 NOTE — Telephone Encounter (Signed)
App made 

## 2018-02-03 NOTE — Telephone Encounter (Signed)
Patient called and had ventral hernia surgery on 02/02/18, he wanted to know if he needs to follow up with you for a post op visit?

## 2018-02-04 ENCOUNTER — Ambulatory Visit: Payer: 59

## 2018-02-04 DIAGNOSIS — K439 Ventral hernia without obstruction or gangrene: Secondary | ICD-10-CM

## 2018-02-04 NOTE — Progress Notes (Signed)
Patient ID: Jorge Boyle, male   DOB: [birth year], 42 y.o.   MRN: [id number] Patient came in for a post op wound check from Umbilical hernia surgery done on 02/02/18. He has complaints of feeling like his dressing is too tight and he feels like he can't breath well. No complaints of chills or fevers. He has not moved his bowels since surgery. Large central abdominal dressing removed. Port sites and surgery incisions healing well and no signs of infection or drainage noted. Small Telfa and Tegaderm dressings placed over two side port sites and a smaller abdominal dressing placed over the incision sites. Patient expressed relief with dressing change. He was instructed to make use of Mira lax for relief of constipation. He is scheduled to follow up with Dr Bary Castilla on 02/17/18.

## 2018-02-05 ENCOUNTER — Encounter: Payer: Self-pay | Admitting: Family Medicine

## 2018-02-05 ENCOUNTER — Ambulatory Visit (INDEPENDENT_AMBULATORY_CARE_PROVIDER_SITE_OTHER): Payer: 59

## 2018-02-05 VITALS — BP 146/90 | HR 103 | Ht 70.0 in | Wt 285.0 lb

## 2018-02-05 DIAGNOSIS — N201 Calculus of ureter: Secondary | ICD-10-CM | POA: Diagnosis not present

## 2018-02-05 DIAGNOSIS — C678 Malignant neoplasm of overlapping sites of bladder: Secondary | ICD-10-CM

## 2018-02-05 NOTE — Anesthesia Postprocedure Evaluation (Signed)
Anesthesia Post Note  Patient: Jorge Boyle  Procedure(s) Performed: CYSTOSCOPY/URETEROSCOPY/HOLMIUM LASER/STENT PLACEMENT (Left Ureter) LAPAROSCOPIC VENTRAL HERNIA (N/A Abdomen)  Patient location during evaluation: PACU Anesthesia Type: General Level of consciousness: awake and alert Pain management: pain level controlled Vital Signs Assessment: post-procedure vital signs reviewed and stable Respiratory status: spontaneous breathing, nonlabored ventilation, respiratory function stable and patient connected to nasal cannula oxygen Cardiovascular status: blood pressure returned to baseline and stable Postop Assessment: no apparent nausea or vomiting Anesthetic complications: no     Last Vitals:  Vitals:   02/02/18 2020 02/02/18 2056  BP:  139/85  Pulse: 91 82  Resp:  18  Temp:    SpO2: 93% 96%    Last Pain:  Vitals:   02/02/18 2056  TempSrc:   PainSc: 2                  Molli Barrows

## 2018-02-05 NOTE — Progress Notes (Signed)
Fill and Pull Catheter Removal  Patient is present today for a catheter removal.  Patient was cleaned and prepped in a sterile fashion 173ml of sterile water/ saline was instilled into the bladder when the patient felt the urge to urinate. 1ml of water was then drained from the balloon.  A 16FR foley cath was removed from the bladder no complications were noted .  Patient as then given some time to void on their own.  Patient can void  128ml on their own after some time.  Patient tolerated well.  Preformed by: Toniann Fail, LPN   Follow up/ Additional notes: Reinforced with pt if not able to urinate to RTC today by 3pm. Pt voiced understanding.  Blood pressure (!) 146/90, pulse (!) 103, height 5\' 10"  (1.778 m), weight 285 lb (129.3 kg).

## 2018-02-06 LAB — STONE ANALYSIS
CA OXALATE, MONOHYDR.: 92 %
Ca Oxalate,Dihydrate: 5 %
Ca phos cry stone ql IR: 3 %
Stone Weight KSTONE: 20.3 mg

## 2018-02-07 ENCOUNTER — Encounter: Payer: Self-pay | Admitting: Family Medicine

## 2018-02-07 DIAGNOSIS — N200 Calculus of kidney: Secondary | ICD-10-CM | POA: Insufficient documentation

## 2018-02-09 ENCOUNTER — Other Ambulatory Visit: Payer: Self-pay | Admitting: Family Medicine

## 2018-02-17 ENCOUNTER — Encounter: Payer: Self-pay | Admitting: General Surgery

## 2018-02-17 ENCOUNTER — Ambulatory Visit (INDEPENDENT_AMBULATORY_CARE_PROVIDER_SITE_OTHER): Payer: 59 | Admitting: General Surgery

## 2018-02-17 ENCOUNTER — Ambulatory Visit: Payer: 59 | Admitting: Urology

## 2018-02-17 ENCOUNTER — Encounter: Payer: Self-pay | Admitting: Urology

## 2018-02-17 VITALS — BP 151/85 | HR 105 | Ht 70.0 in | Wt 290.0 lb

## 2018-02-17 VITALS — BP 148/90 | HR 93 | Resp 18 | Ht 70.0 in | Wt 290.0 lb

## 2018-02-17 DIAGNOSIS — N200 Calculus of kidney: Secondary | ICD-10-CM | POA: Diagnosis not present

## 2018-02-17 DIAGNOSIS — R339 Retention of urine, unspecified: Secondary | ICD-10-CM

## 2018-02-17 DIAGNOSIS — K439 Ventral hernia without obstruction or gangrene: Secondary | ICD-10-CM

## 2018-02-17 DIAGNOSIS — N201 Calculus of ureter: Secondary | ICD-10-CM

## 2018-02-17 LAB — MICROSCOPIC EXAMINATION
EPITHELIAL CELLS (NON RENAL): NONE SEEN /HPF (ref 0–10)
RBC, UA: >30 /hpf — ABNORMAL HIGH (ref 0–2)

## 2018-02-17 LAB — URINALYSIS, COMPLETE
BILIRUBIN UA: NEGATIVE
Glucose, UA: NEGATIVE
Ketones, UA: NEGATIVE
Nitrite, UA: NEGATIVE
PH UA: 7 (ref 5.0–7.5)
Specific Gravity, UA: 1.02 (ref 1.005–1.030)
UUROB: 0.2 mg/dL (ref 0.2–1.0)

## 2018-02-17 MED ORDER — LIDOCAINE HCL URETHRAL/MUCOSAL 2 % EX GEL
1.0000 "application " | Freq: Once | CUTANEOUS | Status: AC
  Administered 2018-02-17: 1 via URETHRAL

## 2018-02-17 MED ORDER — CIPROFLOXACIN HCL 500 MG PO TABS
500.0000 mg | ORAL_TABLET | Freq: Once | ORAL | Status: AC
Start: 2018-02-17 — End: 2018-02-17
  Administered 2018-02-17: 500 mg via ORAL

## 2018-02-17 NOTE — Patient Instructions (Signed)
Follow up in 2 weeks. May return to work Monday as planned. Proper lifting techniques reviewed. Resume activities as tolerated.

## 2018-02-17 NOTE — Progress Notes (Signed)
   02/17/18  CC:  Chief Complaint  Patient presents with  . Cysto Stent Removal    HPI: 42 year old male with an 8 mm left proximal ureteral stone to underwent left ureteroscopy with laser lithotripsy on 02/02/2018.  He also underwent ventral hernia repair with Dr. Tollie Pizza concomitantly.  Postoperatively, he developed urinary retention requiring a Foley catheter.  He underwent successful voiding trial several days after surgery and has been voiding spontaneously since.  He reports that he has had a somewhat rough recovery.  He does have some flank pain associated with his stent.  No fevers or chills.  He is anxious to have a stent removed today.  Blood pressure (!) 151/85, pulse (!) 105, height 5\' 10"  (1.778 m), weight 290 lb (131.5 kg). NED. A&Ox3.   No respiratory distress   Abd soft, NT, ND Normal phallus with bilateral descended testicles  Cystoscopy/ Stent removal procedure  Patient identification was confirmed, informed consent was obtained, and patient was prepped using Betadine solution.  Lidocaine jelly was administered per urethral meatus.    Preoperative abx where received prior to procedure.    Procedure: - Flexible cystoscope introduced, without any difficulty.   - Thorough search of the bladder revealed:    normal urethral meatus  Stent seen emanating from left ureteral orifice, grasped with stent graspers, and removed in entirety.    Post-Procedure: - Patient tolerated the procedure well   Assessment/ Plan:  1. Left ureteral stone Status post uncomplicated left ureteroscopy Stent removed today without difficulty Follow-up in 4 weeks with renal ultrasound prior Warning symptoms reviewed - Urinalysis, Complete - ciprofloxacin (CIPRO) tablet 500 mg - lidocaine (XYLOCAINE) 2 % jelly 1 application - US RENAL; Future  2. Urinary retention Postop urinary retention status post a successful voiding trial No urinary complaints today Continue Flomax for 2  additional days  Return in about 1 month (around 03/17/2018) for RUS.   Hollice Espy, MD

## 2018-02-17 NOTE — Progress Notes (Signed)
Patient ID: Jorge Boyle, male   DOB: [birth year], 42 y.o.   MRN: [id number]  Chief Complaint  Patient presents with  . Routine Post Op    HPI [name] is a 42 y.o. male here today for his follow up ventral hernia repair doen on 02/02/2018. Patient states he is still sore but much better. He states he is voiding well since catheter removed.  HPI  Past Medical History:  Diagnosis Date  . Allergic rhinitis   . Diabetes mellitus without complication ([place])   . Difficult intubation   . Headache   . History of chicken pox   . History of kidney stones   . Multiple thyroid nodules 01/2012   left, biopsy x2 benign, rpt [place] stable (01/2013, 07/2015) rec rpt 1 yr  . Nontoxic nodular goiter   . Obesity   . Prediabetes   . S/P excision of thyroid adenoma 2005   R thyroidectomy    Past Surgical History:  Procedure Laterality Date  . CYSTOSCOPY/URETEROSCOPY/HOLMIUM LASER/STENT PLACEMENT Left 02/02/2018   Procedure: CYSTOSCOPY/URETEROSCOPY/HOLMIUM LASER/STENT PLACEMENT;  Surgeon: Hollice Espy, MD;  Location: [facility];  Service: Urology;  Laterality: Left;  . THYROIDECTOMY, PARTIAL  2005   Right and isthmus. Dr Nadeen Landau  . TONSILLECTOMY AND ADENOIDECTOMY  1985  . UMBILICAL HERNIA REPAIR  12/30/2013   Aricela Bertagnolli  . VENTRAL HERNIA REPAIR N/A 02/02/2018   Procedure: LAPAROSCOPIC VENTRAL HERNIA;  Surgeon: Robert Bellow, MD;  Location: [facility];  Service: General;  Laterality: N/A;    Family History  Problem Relation Age of Onset  . Diabetes Paternal Grandmother   . Cancer Paternal Grandmother 30       breast  . Diabetes Mother   . Diabetes Father   . Stroke Maternal Grandmother   . Diabetes Maternal Grandfather   . Cancer Other        stomach, lung (smoker)  . Coronary artery disease Neg Hx     Social History Social History   Tobacco Use  . Smoking status: Never Smoker  . Smokeless tobacco: Never Used  Substance Use Topics  . Alcohol use: Yes   Alcohol/week: 0.0 oz    Comment: rare  . Drug use: No    Allergies  Allergen Reactions  . Penicillins Rash    Has patient had a PCN reaction causing immediate rash, facial/tongue/throat swelling, SOB or lightheadedness with hypotension: Yes Has patient had a PCN reaction causing severe rash involving mucus membranes or skin necrosis: No Has patient had a PCN reaction that required hospitalization: Unknown Has patient had a PCN reaction occurring within the last 10 years: No If all of the above answers are "NO", then may proceed with Cephalosporin use.     Current Outpatient Medications  Medication Sig Dispense Refill  . ACCU-CHEK FASTCLIX LANCETS MISC Check blood sugar once daily and as needed. Dx E11.9 100 each 1  . Blood Glucose Monitoring Suppl (ACCU-CHEK GUIDE) w/Device KIT 1 Device by Does not apply route daily. Dx E11.9 1 kit 0  . docusate sodium (COLACE) 100 MG capsule Take 1 capsule (100 mg total) by mouth 2 (two) times daily. 60 capsule 0  . glucose blood (ACCU-CHEK GUIDE) test strip Check blood sugar once daily and as needed. Dx E11.9 100 each 0  . ibuprofen (ADVIL,MOTRIN) 600 MG tablet Take 1 tablet (600 mg total) by mouth every 6 (six) hours as needed. (Patient taking differently: Take 600 mg by mouth every 6 (six) hours as needed for moderate  pain. ) 30 tablet 0  . metFORMIN (GLUCOPHAGE) 1000 MG tablet TAKE 1 TABLET (1,000 MG TOTAL) BY MOUTH 2 (TWO) TIMES DAILY WITH A MEAL. 60 tablet 0  . oxybutynin (DITROPAN) 5 MG tablet Take 1 tablet (5 mg total) by mouth every 8 (eight) hours as needed for bladder spasms. 30 tablet 0  . tamsulosin (FLOMAX) 0.4 MG CAPS capsule Take 1 capsule (0.4 mg total) by mouth daily. 30 capsule 3   Current Facility-Administered Medications  Medication Dose Route Frequency Provider Last Rate Last Dose  . betamethasone acetate-betamethasone sodium phosphate (CELESTONE) injection 3 mg  3 mg Intramuscular Once Edrick Kins, DPM        Review of  Systems Review of Systems  Constitutional: Negative.   Respiratory: Negative.   Cardiovascular: Negative.   Gastrointestinal: Negative for constipation, diarrhea and nausea.    Blood pressure (!) 148/90, pulse 93, resp. rate 18, height _0  (1.778 m), weight 290 lb (131.5 kg), SpO2 96 %.  Physical Exam Physical Exam  Constitutional: He is oriented to person, place, and time. He appears well-developed and well-nourished.  Cardiovascular: Normal rate, regular rhythm and normal heart sounds.  Pulmonary/Chest: Effort normal and breath sounds normal.  Abdominal: Normal appearance and bowel sounds are normal.    Mild bruising LLQ port site  Neurological: He is alert and oriented to person, place, and time.  Skin: Skin is warm and dry.  Psychiatric: He has a normal mood and affect.       Assessment    Doing well status post laparoscopic repair of a recurrent ventral hernia.    Plan  Follow up in 2 weeks. May return to work Monday as planned. Proper lifting techniques reviewed. Resume activities as tolerated.   HPI, Physical Exam, Assessment and Plan have been scribed under the direction and in the presence of Robert Bellow, MD. Karie Fetch, RN  I have completed the exam and reviewed the above documentation for accuracy and completeness.  I agree with the above.  Haematologist has been used and any errors in dictation or transcription are unintentional.  Hervey Ard, M.D., F.A.C.S.  Forest Gleason Winefred Hillesheim 02/18/2018, 5:45 PM

## 2018-03-03 ENCOUNTER — Ambulatory Visit (INDEPENDENT_AMBULATORY_CARE_PROVIDER_SITE_OTHER): Payer: 59 | Admitting: General Surgery

## 2018-03-03 ENCOUNTER — Encounter: Payer: Self-pay | Admitting: General Surgery

## 2018-03-03 VITALS — BP 158/82 | HR 83 | Resp 16 | Ht 70.0 in | Wt 290.0 lb

## 2018-03-03 DIAGNOSIS — K439 Ventral hernia without obstruction or gangrene: Secondary | ICD-10-CM

## 2018-03-03 NOTE — Progress Notes (Signed)
Patient ID: Jorge Boyle, male   DOB: [birth year], 42 y.o.   MRN: [id number]  Chief Complaint  Patient presents with  . Routine Post Op    HPI Jorge Boyle is a 42 y.o. male here today for his post op ventral hernia done on 02/02/2018. He states he is doing well, mild soreness. No Gi issues.  Ultrasound is scheduled for 03-20-18.  HPI  Past Medical History:  Diagnosis Date  . Allergic rhinitis   . Diabetes mellitus without complication ([place])   . Difficult intubation   . Headache   . History of chicken pox   . History of kidney stones   . Multiple thyroid nodules 01/2012   left, biopsy x2 benign, rpt [place] stable (01/2013, 07/2015) rec rpt 1 yr  . Nontoxic nodular goiter   . Obesity   . Prediabetes   . S/P excision of thyroid adenoma 2005   R thyroidectomy    Past Surgical History:  Procedure Laterality Date  . CYSTOSCOPY/URETEROSCOPY/HOLMIUM LASER/STENT PLACEMENT Left 02/02/2018   Procedure: CYSTOSCOPY/URETEROSCOPY/HOLMIUM LASER/STENT PLACEMENT;  Surgeon: Hollice Espy, MD;  Location: [facility];  Service: Urology;  Laterality: Left;  . THYROIDECTOMY, PARTIAL  2005   Right and isthmus. Dr Nadeen Landau  . TONSILLECTOMY AND ADENOIDECTOMY  1985  . UMBILICAL HERNIA REPAIR  12/30/2013   Leni Pankonin  . VENTRAL HERNIA REPAIR N/A 02/02/2018   Procedure: LAPAROSCOPIC VENTRAL HERNIA;  Surgeon: Robert Bellow, MD;  Location: [facility];  Service: General;  Laterality: N/A;    Family History  Problem Relation Age of Onset  . Diabetes Paternal Grandmother   . Cancer Paternal Grandmother 90       breast  . Diabetes Mother   . Diabetes Father   . Stroke Maternal Grandmother   . Diabetes Maternal Grandfather   . Cancer Other        stomach, lung (smoker)  . Coronary artery disease Neg Hx     Social History Social History   Tobacco Use  . Smoking status: Never Smoker  . Smokeless tobacco: Never Used  Substance Use Topics  . Alcohol use: Yes    Alcohol/week: 0.0 oz     Comment: rare  . Drug use: No    Allergies  Allergen Reactions  . Penicillins Rash    Has patient had a PCN reaction causing immediate rash, facial/tongue/throat swelling, SOB or lightheadedness with hypotension: Yes Has patient had a PCN reaction causing severe rash involving mucus membranes or skin necrosis: No Has patient had a PCN reaction that required hospitalization: Unknown Has patient had a PCN reaction occurring within the last 10 years: No If all of the above answers are "NO", then may proceed with Cephalosporin use.     Current Outpatient Medications  Medication Sig Dispense Refill  . ACCU-CHEK FASTCLIX LANCETS MISC Check blood sugar once daily and as needed. Dx E11.9 100 each 1  . Blood Glucose Monitoring Suppl (ACCU-CHEK GUIDE) w/Device KIT 1 Device by Does not apply route daily. Dx E11.9 1 kit 0  . docusate sodium (COLACE) 100 MG capsule Take 1 capsule (100 mg total) by mouth 2 (two) times daily. 60 capsule 0  . glucose blood (ACCU-CHEK GUIDE) test strip Check blood sugar once daily and as needed. Dx E11.9 100 each 0  . ibuprofen (ADVIL,MOTRIN) 200 MG tablet Take 200 mg by mouth every 6 (six) hours as needed.    . metFORMIN (GLUCOPHAGE) 1000 MG tablet TAKE 1 TABLET (1,000 MG TOTAL) BY MOUTH 2 (TWO)  TIMES DAILY WITH A MEAL. 60 tablet 0  . oxybutynin (DITROPAN) 5 MG tablet Take 1 tablet (5 mg total) by mouth every 8 (eight) hours as needed for bladder spasms. 30 tablet 0  . tamsulosin (FLOMAX) 0.4 MG CAPS capsule Take 1 capsule (0.4 mg total) by mouth daily. 30 capsule 3   Current Facility-Administered Medications  Medication Dose Route Frequency Provider Last Rate Last Dose  . betamethasone acetate-betamethasone sodium phosphate (CELESTONE) injection 3 mg  3 mg Intramuscular Once Edrick Kins, DPM        Review of Systems Review of Systems  Constitutional: Negative.   Respiratory: Negative.   Cardiovascular: Negative.   Gastrointestinal: Negative for constipation  and diarrhea.    Blood pressure (!) 158/82, pulse 83, resp. rate 16, height _0  (1.778 m), weight 290 lb (131.5 kg), SpO2 95 %.  Physical Exam Physical Exam  Constitutional: He is oriented to person, place, and time. He appears well-developed and well-nourished.  Abdominal: Soft.    Hernia repair intact  Neurological: He is alert and oriented to person, place, and time.  Skin: Skin is warm and dry.  Psychiatric: His behavior is normal.       Assessment    Doing well status post laparoscopic repair of recurrent umbilical hernia.    Plan    Follow up in 3 months Proper lifting techniques reviewed. Resume activities as tolerated.       HPI, Physical Exam, Assessment and Plan have been scribed under the direction and in the presence of Robert Bellow, MD. Karie Fetch, RN  I have completed the exam and reviewed the above documentation for accuracy and completeness.  I agree with the above.  [employer] has been used and any errors in dictation or transcription are unintentional.  Hervey Ard, M.D., F.A.C.S..  Forest Gleason Maryalyce Sanjuan 03/03/2018, 9:15 PM

## 2018-03-03 NOTE — Patient Instructions (Addendum)
The patient is aware to call back for any questions or concerns.    Proper lifting techniques reviewed. Resume activities as tolerated. 

## 2018-03-20 ENCOUNTER — Ambulatory Visit
Admission: RE | Admit: 2018-03-20 | Discharge: 2018-03-20 | Disposition: A | Discharge status: Home / Self Care | Payer: 59 | Source: Ambulatory Visit | Attending: Urology | Admitting: Urology

## 2018-03-20 DIAGNOSIS — N201 Calculus of ureter: Secondary | ICD-10-CM | POA: Insufficient documentation

## 2018-03-24 ENCOUNTER — Encounter: Payer: Self-pay | Admitting: Urology

## 2018-03-24 ENCOUNTER — Ambulatory Visit (INDEPENDENT_AMBULATORY_CARE_PROVIDER_SITE_OTHER): Payer: 59 | Admitting: Urology

## 2018-03-24 VITALS — BP 138/88 | HR 77 | Ht 70.0 in | Wt 286.4 lb

## 2018-03-24 DIAGNOSIS — N201 Calculus of ureter: Secondary | ICD-10-CM | POA: Diagnosis not present

## 2018-03-24 DIAGNOSIS — R339 Retention of urine, unspecified: Secondary | ICD-10-CM

## 2018-03-24 NOTE — Progress Notes (Signed)
03/24/2018 2:06 PM   Jorge Boyle [birth year] [id number]  Referring provider: Ria Bush, MD [address] [place], [place] [address]  Chief Complaint  Patient presents with  . Follow-up    kidney stone    HPI: 42 year old male with an 8 mm left proximal ureteral stone to underwent left ureteroscopy with laser lithotripsy on 02/02/2018.  He also underwent ventral hernia repair with Dr. Tollie Pizza concomitantly.  Postoperatively, he developed urinary retention requiring a Foley catheter.  He underwent successful voiding trial several days after surgery and has been voiding spontaneously since.  Return to the office on 01/21/2018 for cystoscopy, stent removal.  Follow-up renal ultrasound today shows resolution of hydronephrosis with no further kidney stones.  Stone analysis consistent with calcium oxalate monohydrate 92%, calcium oxalate dihydrate 5%, calcium phosphate 3%  Today, he reports that he is doing very well.  He has no further flank pain or urinary symptoms.  He feels like he is emptying his bladder completely.  No previous history of stones.   PMH: Past Medical History:  Diagnosis Date  . Allergic rhinitis   . Diabetes mellitus without complication ([place])   . Difficult intubation   . Headache   . History of chicken pox   . History of kidney stones   . Multiple thyroid nodules 01/2012   left, biopsy x2 benign, rpt [place] stable (01/2013, 07/2015) rec rpt 1 yr  . Nontoxic nodular goiter   . Obesity   . Prediabetes   . S/P excision of thyroid adenoma 2005   R thyroidectomy    Surgical History: Past Surgical History:  Procedure Laterality Date  . CYSTOSCOPY/URETEROSCOPY/HOLMIUM LASER/STENT PLACEMENT Left 02/02/2018   Procedure: CYSTOSCOPY/URETEROSCOPY/HOLMIUM LASER/STENT PLACEMENT;  Surgeon: Hollice Espy, MD;  Location: [facility];  Service: Urology;  Laterality: Left;  . THYROIDECTOMY, PARTIAL  2005   Right and isthmus. Dr Nadeen Landau  .  TONSILLECTOMY AND ADENOIDECTOMY  1985  . UMBILICAL HERNIA REPAIR  12/30/2013   [place]nett  . VENTRAL HERNIA REPAIR N/A 02/02/2018   Procedure: LAPAROSCOPIC VENTRAL HERNIA;  Surgeon: Robert Bellow, MD;  Location: [facility];  Service: General;  Laterality: N/A;    Home Medications:  Allergies as of 03/24/2018      Reactions   Penicillins Rash   Has patient had a PCN reaction causing immediate rash, facial/tongue/throat swelling, SOB or lightheadedness with hypotension: Yes Has patient had a PCN reaction causing severe rash involving mucus membranes or skin necrosis: No Has patient had a PCN reaction that required hospitalization: Unknown Has patient had a PCN reaction occurring within the last 10 years: No If all of the above answers are "NO", then may proceed with Cephalosporin use.      Medication List        Accurate as of 03/24/18  2:06 PM. Always use your most recent med list.          ACCU-CHEK FASTCLIX LANCETS Misc Check blood sugar once daily and as needed. Dx E11.9   ACCU-CHEK GUIDE w/Device Kit 1 Device by Does not apply route daily. Dx E11.9   glucose blood test strip Commonly known as:  ACCU-CHEK GUIDE Check blood sugar once daily and as needed. Dx E11.9   ibuprofen 200 MG tablet Commonly known as:  ADVIL,MOTRIN Take 200 mg by mouth every 6 (six) hours as needed.   metFORMIN 1000 MG tablet Commonly known as:  GLUCOPHAGE TAKE 1 TABLET (1,000 MG TOTAL) BY MOUTH 2 (TWO) TIMES DAILY WITH A MEAL.  Allergies:  Allergies  Allergen Reactions  . Penicillins Rash    Has patient had a PCN reaction causing immediate rash, facial/tongue/throat swelling, SOB or lightheadedness with hypotension: Yes Has patient had a PCN reaction causing severe rash involving mucus membranes or skin necrosis: No Has patient had a PCN reaction that required hospitalization: Unknown Has patient had a PCN reaction occurring within the last 10 years: No If all of the above answers are  "NO", then may proceed with Cephalosporin use.     Family History: Family History  Problem Relation Age of Onset  . Diabetes Paternal Grandmother   . Cancer Paternal Grandmother 40       breast  . Diabetes Mother   . Diabetes Father   . Stroke Maternal Grandmother   . Diabetes Maternal Grandfather   . Cancer Other        stomach, lung (smoker)  . Coronary artery disease Neg Hx     Social History:  reports that he has never smoked. He has never used smokeless tobacco. He reports that he drinks alcohol. He reports that he does not use drugs.  ROS: UROLOGY Frequent Urination?: No Hard to postpone urination?: No Burning/pain with urination?: No Get up at night to urinate?: No Leakage of urine?: No Urine stream starts and stops?: No Trouble starting stream?: No Do you have to strain to urinate?: No Blood in urine?: No Urinary tract infection?: No Sexually transmitted disease?: No Injury to kidneys or bladder?: No Painful intercourse?: No Weak stream?: No Erection problems?: No Penile pain?: No  Gastrointestinal Nausea?: No Vomiting?: No Indigestion/heartburn?: No Diarrhea?: No Constipation?: No  Constitutional Fever: No Night sweats?: No Weight loss?: No Fatigue?: No  Skin Skin rash/lesions?: No Itching?: No  Eyes Blurred vision?: No Double vision?: No  Ears/Nose/Throat Sore throat?: No Sinus problems?: Yes  Hematologic/Lymphatic Swollen glands?: No Easy bruising?: No  Cardiovascular Leg swelling?: No Chest pain?: No  Respiratory Cough?: No Shortness of breath?: No  Endocrine Excessive thirst?: No  Musculoskeletal Back pain?: Yes Joint pain?: No  Neurological Headaches?: Yes Dizziness?: No  Psychologic Depression?: No Anxiety?: No  Physical Exam: BP 138/88   Pulse 77   Ht _0  (1.778 m)   Wt 286 lb 6.4 oz (129.9 kg)   BMI 41.09 kg/m   Constitutional:  Alert and oriented, No acute distress. HEENT: [place] AT, moist mucus  membranes.  Trachea midline, no masses. Cardiovascular: No clubbing, cyanosis, or edema. Respiratory: Normal respiratory effort, no increased work of breathing. Skin: No rashes, bruises or suspicious lesions. Neurologic: Grossly intact, no focal deficits, moving all 4 extremities. Psychiatric: Normal mood and affect.  Laboratory Data: Lab Results  Component Value Date   WBC 13.9 (H) 01/13/2018   HGB 14.7 01/13/2018   HCT 43.3 01/13/2018   MCV 89.7 01/13/2018   PLT 305 01/13/2018    Lab Results  Component Value Date   CREATININE 0.84 01/27/2018    Lab Results  Component Value Date   HGBA1C 8.8 (H) 10/28/2017    Urinalysis N/a  Pertinent Imaging: Results for orders placed during the hospital encounter of 03/20/18  US RENAL   Narrative CLINICAL DATA:  Left ureteral stone.  Post op  ureteroscopy  EXAM: RENAL / URINARY TRACT ULTRASOUND COMPLETE  COMPARISON:  CT abdomen pelvis 01/13/2018  FINDINGS: Right Kidney:  Length: 12.4 cm. Echogenicity within normal limits. No mass or hydronephrosis visualized.  Left Kidney:  Length: 12.1 cm. Echogenicity within normal limits. No mass or hydronephrosis visualized. Interval  resolution of left-sided hydronephrosis identified on prior CT  Bladder:  Appears normal for degree of bladder distention.  IMPRESSION: Negative renal ultrasound.  Resolution of left-sided hydronephrosis.   Electronically Signed   By: Franchot Gallo M.D.   On: 03/20/2018 10:18    Renal ultrasound personally reviewed today.  Results for orders placed or performed in visit on 02/17/18  Microscopic Examination  Result Value Ref Range   WBC, UA 0-5 0 - 5 /hpf   RBC, UA >30 (H) 0 - 2 /hpf   Epithelial Cells (non renal) None seen 0 - 10 /hpf   Mucus, UA Present (A) Not Estab.   Bacteria, UA Few (A) None seen/Few  Urinalysis, Complete  Result Value Ref Range   Specific Gravity, UA 1.020 1.005 - 1.030   pH, UA 7.0 5.0 - 7.5   Color, UA Yellow  Yellow   Appearance Ur Cloudy (A) Clear   Leukocytes, UA 1+ (A) Negative   Protein, UA 2+ (A) Negative/Trace   Glucose, UA Negative Negative   Ketones, UA Negative Negative   RBC, UA 3+ (A) Negative   Bilirubin, UA Negative Negative   Urobilinogen, Ur 0.2 0.2 - 1.0 mg/dL   Nitrite, UA Negative Negative   Microscopic Examination See below:     Assessment & Plan:    1. Left ureteral stone Status post uncomplicated left ureteroscopy with concomitant internal hernia repair No iatrogenic or silent hydronephrosis on renal ultrasound Stone analysis reviewed with patient today We discussed general stone prevention techniques including drinking plenty water with goal of producing 2.5 L urine daily, increased citric acid intake, avoidance of high oxalate containing foods, and decreased salt intake.  Information about dietary recommendations given today.  2. Urinary retention Postoperative urinary retention, resolved  no voiding complaints today No longer taking Flomax  Return if symptoms worsen or fail to improve.  Hollice Espy, MD  [facility] [address], [place],  [address] [phone]

## 2018-04-05 ENCOUNTER — Other Ambulatory Visit: Payer: Self-pay | Admitting: Family Medicine

## 2018-04-21 ENCOUNTER — Ambulatory Visit: Payer: 59 | Admitting: Family Medicine

## 2018-04-21 ENCOUNTER — Encounter: Payer: Self-pay | Admitting: Family Medicine

## 2018-04-21 VITALS — BP 140/86 | HR 99 | Temp 97.8°F | Ht 70.0 in | Wt 286.5 lb

## 2018-04-21 DIAGNOSIS — I10 Essential (primary) hypertension: Secondary | ICD-10-CM

## 2018-04-21 DIAGNOSIS — K429 Umbilical hernia without obstruction or gangrene: Secondary | ICD-10-CM

## 2018-04-21 DIAGNOSIS — L237 Allergic contact dermatitis due to plants, except food: Secondary | ICD-10-CM | POA: Insufficient documentation

## 2018-04-21 DIAGNOSIS — E1142 Type 2 diabetes mellitus with diabetic polyneuropathy: Secondary | ICD-10-CM | POA: Diagnosis not present

## 2018-04-21 LAB — POCT GLYCOSYLATED HEMOGLOBIN (HGB A1C): Hemoglobin A1C: 6.8 % — AB (ref 4.0–5.6)

## 2018-04-21 MED ORDER — LISINOPRIL 10 MG PO TABS: 10.0000 mg | ORAL_TABLET | Freq: Every day | ORAL | 6 refills | Status: DC

## 2018-04-21 MED ORDER — METFORMIN HCL 1000 MG PO TABS: 1000.0000 mg | ORAL_TABLET | Freq: Two times a day (BID) | ORAL | 6 refills | Status: DC

## 2018-04-21 MED ORDER — TRIAMCINOLONE ACETONIDE 0.1 % EX CREA: 1.0000 "application " | TOPICAL_CREAM | Freq: Two times a day (BID) | CUTANEOUS | 0 refills | Status: DC

## 2018-04-21 NOTE — Progress Notes (Signed)
BP 140/86 (BP Location: Left Arm, Patient Position: Sitting, Cuff Size: Large)   Pulse 99   Temp 97.8 F (36.6 C) (Oral)   Ht 5' 10" (1.778 m)   Wt 286 lb 8 oz (130 kg)   SpO2 95%   BMI 41.11 kg/m    CC: elevated blood pressures Subjective:    Patient ID: Jorge Boyle, male    DOB: 06/28/1976, 42 y.o.   MRN: [id number]  HPI: Jorge Boyle is a 42 y.o. male presenting on 04/21/2018 for Elevated BP (Started having elevated BP readings about 2 mos ago, around the time he had surgery. BP has been basically elevated since then. Pt provided recent readings. ) and Itchy skin (C/o itching on UEs/LEs after doing yardwork.)   Itchy rash on arms that started after weed-wacking friend's yard. Worried about poison ivy. Brings picture of friend with typical poison ivy rash  Recent recurrent umbilical hernia s.p repair at same time as kidney stone removal 01/2018.   DM - does regularly check sugars - endorses better control with metformin. Compliant with antihyperglycemic regimen which includes: metformin 1031m bid. Denies low sugars or hypoglycemic symptoms. Last diabetic eye exam DUE. Pneumovax: 2016. Prevnar: not due. Glucometer brand: unsure. DSME: will need to discuss at f/u. Lab Results  Component Value Date   HGBA1C 6.8 (A) 04/21/2018   Diabetic Foot Exam - Simple   Simple Foot Form Diabetic Foot exam was performed with the following findings:  Yes 04/21/2018 10:27 AM  Visual Inspection See comments:  Yes Sensation Testing Intact to touch and monofilament testing bilaterally:  Yes Pulse Check Posterior Tibialis and Dorsalis pulse intact bilaterally:  Yes Comments Some callus formation    Lab Results  Component Value Date   MICROALBUR 0.8 10/28/2017     Elevated blood pressure readings at home over last several months. Brings bp log over the last month - 120-150/80-100. Checks with mom's cuff.   Relevant past medical, surgical, family and social history reviewed and  updated as indicated. Interim medical history since our last visit reviewed. Allergies and medications reviewed and updated. Outpatient Medications Prior to Visit  Medication Sig Dispense Refill  . ACCU-CHEK FASTCLIX LANCETS MISC Check blood sugar once daily and as needed. Dx E11.9 100 each 1  . Blood Glucose Monitoring Suppl (ACCU-CHEK GUIDE) w/Device KIT 1 Device by Does not apply route daily. Dx E11.9 1 kit 0  . glucose blood (ACCU-CHEK GUIDE) test strip Check blood sugar once daily and as needed. Dx E11.9 100 each 0  . metFORMIN (GLUCOPHAGE) 1000 MG tablet TAKE 1 TABLET (1,000 MG TOTAL) BY MOUTH 2 (TWO) TIMES DAILY WITH A MEAL. 60 tablet 0  . ibuprofen (ADVIL,MOTRIN) 200 MG tablet Take 200 mg by mouth every 6 (six) hours as needed.     Facility-Administered Medications Prior to Visit  Medication Dose Route Frequency Provider Last Rate Last Dose  . betamethasone acetate-betamethasone sodium phosphate (CELESTONE) injection 3 mg  3 mg Intramuscular Once EEdrick Kins DPM         Per HPI unless specifically indicated in ROS section below Review of Systems     Objective:    BP 140/86 (BP Location: Left Arm, Patient Position: Sitting, Cuff Size: Large)   Pulse 99   Temp 97.8 F (36.6 C) (Oral)   Ht 5' 10" (1.778 m)   Wt 286 lb 8 oz (130 kg)   SpO2 95%   BMI 41.11 kg/m   Wt Readings from Last 3  Encounters:  04/21/18 286 lb 8 oz (130 kg)  03/24/18 286 lb 6.4 oz (129.9 kg)  03/03/18 290 lb (131.5 kg)    Physical Exam  Constitutional: He appears well-developed and well-nourished. No distress.  HENT:  Head: Normocephalic and atraumatic.  Right Ear: External ear normal.  Left Ear: External ear normal.  Nose: Nose normal.  Mouth/Throat: Oropharynx is clear and moist. No oropharyngeal exudate.  Eyes: Pupils are equal, round, and reactive to light. Conjunctivae and EOM are normal. No scleral icterus.  Neck: Normal range of motion. Neck supple.  Cardiovascular: Normal rate, regular  rhythm, normal heart sounds and intact distal pulses.  No murmur heard. Pulmonary/Chest: Effort normal and breath sounds normal. No respiratory distress. He has no wheezes. He has no rales.  Musculoskeletal: He exhibits no edema.  See HPI for foot exam if done  Lymphadenopathy:    He has no cervical adenopathy.  Skin: Skin is warm and dry. Rash noted.  Pruritic mild papular rash on forearms  Psychiatric: He has a normal mood and affect.  Nursing note and vitals reviewed.   Lab Results  Component Value Date   HGBA1C 6.8 (A) 04/21/2018       Assessment & Plan:   Problem List Items Addressed This Visit    Umbilical hernia    S/p repair of recurrent hernia.       Type 2 diabetes, controlled, with peripheral neuropathy (HCC)    Chronic, good control based on recall cbg's. Update A1c today. Continue metformin. Encouraged he schedule eye exam.       Relevant Medications   lisinopril (PRINIVIL,ZESTRIL) 10 MG tablet   metFORMIN (GLUCOPHAGE) 1000 MG tablet   Other Relevant Orders   POCT glycosylated hemoglobin (Hb A1C) (Completed)   Poison ivy dermatitis    Story/exam consistent with this. Rx triamcinolone cream. Avoid oral prednisone in DM hx.      Essential hypertension - Primary    Averaging elevated readings at home.  Start lisinopril 94m daily - advised monitor for developing dry cough and allergic reaction of angioedema.  Encouraged limiting sodium in diet.       Relevant Medications   lisinopril (PRINIVIL,ZESTRIL) 10 MG tablet       Meds ordered this encounter  Medications  . lisinopril (PRINIVIL,ZESTRIL) 10 MG tablet    Sig: Take 1 tablet (10 mg total) by mouth daily.    Dispense:  30 tablet    Refill:  6  . metFORMIN (GLUCOPHAGE) 1000 MG tablet    Sig: Take 1 tablet (1,000 mg total) by mouth 2 (two) times daily with a meal.    Dispense:  60 tablet    Refill:  6  . triamcinolone cream (KENALOG) 0.1 %    Sig: Apply 1 application topically 2 (two) times daily.  Apply to AA.    Dispense:  80 g    Refill:  0   Orders Placed This Encounter  Procedures  . POCT glycosylated hemoglobin (Hb A1C)    Follow up plan: Return in about 3 months (around 07/22/2018), or if symptoms worsen or fail to improve, for annual exam, prior fasting for blood work.  JRia Bush MD

## 2018-04-21 NOTE — Assessment & Plan Note (Signed)
Story/exam consistent with this. Rx triamcinolone cream. Avoid oral prednisone in DM hx.

## 2018-04-21 NOTE — Assessment & Plan Note (Signed)
Averaging elevated readings at home.  Start lisinopril 10mg  daily - advised monitor for developing dry cough and allergic reaction of angioedema.  Encouraged limiting sodium in diet.

## 2018-04-21 NOTE — Assessment & Plan Note (Signed)
S/p repair of recurrent hernia.

## 2018-04-21 NOTE — Assessment & Plan Note (Addendum)
Chronic, good control based on recall cbg's. Update A1c today. Continue metformin. Encouraged he schedule eye exam.

## 2018-04-21 NOTE — Patient Instructions (Addendum)
Return for physical at your convenience with fasting labs (2-3 months).  A1c today to check sugar control. Start lisinopril 10mg  daily - new blood pressure medicine. Watch for developing dry cough or tongue/lip swelling.  Stay well hydrated.  Schedule eye exam at your convenience.  Use steroid cream sent to pharmacy for itchy rash on arms.

## 2018-05-06 ENCOUNTER — Encounter: Payer: Self-pay | Admitting: *Deleted

## 2018-05-29 ENCOUNTER — Other Ambulatory Visit: Payer: Self-pay | Admitting: Family Medicine

## 2018-06-09 ENCOUNTER — Encounter: Payer: Self-pay | Admitting: General Surgery

## 2018-06-09 ENCOUNTER — Ambulatory Visit (INDEPENDENT_AMBULATORY_CARE_PROVIDER_SITE_OTHER): Payer: 59 | Admitting: General Surgery

## 2018-06-09 VITALS — BP 144/82 | HR 80 | Resp 12 | Ht 70.0 in | Wt 290.0 lb

## 2018-06-09 DIAGNOSIS — K439 Ventral hernia without obstruction or gangrene: Secondary | ICD-10-CM

## 2018-06-09 NOTE — Progress Notes (Signed)
Patient ID: Jorge Boyle, male   DOB: 05-31-1976, 42 y.o.   MRN: [id number]  Chief Complaint  Patient presents with  . Routine Post Op    HPI [name] is a 42 y.o. male.  Here for follow up visit, ventral hernia 02-02-18. He states he is doing well, no complaints.   HPI  Past Medical History:  Diagnosis Date  . Allergic rhinitis   . Diabetes mellitus without complication ([place])   . Difficult intubation   . Headache   . History of chicken pox   . History of kidney stones   . Multiple thyroid nodules 01/2012   left, biopsy x2 benign, rpt [place] stable (01/2013, 07/2015) rec rpt 1 yr  . Nontoxic nodular goiter   . Obesity   . Prediabetes   . S/P excision of thyroid adenoma 2005   R thyroidectomy    Past Surgical History:  Procedure Laterality Date  . CYSTOSCOPY/URETEROSCOPY/HOLMIUM LASER/STENT PLACEMENT Left 02/02/2018   Procedure: CYSTOSCOPY/URETEROSCOPY/HOLMIUM LASER/STENT PLACEMENT;  Surgeon: Hollice Espy, MD;  Location: [facility] ORS;  Service: Urology;  Laterality: Left;  . THYROIDECTOMY, PARTIAL  2005   Right and isthmus. Dr Nadeen Landau  . TONSILLECTOMY AND ADENOIDECTOMY  1985  . UMBILICAL HERNIA REPAIR  12/30/2013   Eastyn Dattilo  . VENTRAL HERNIA REPAIR N/A 02/02/2018   Procedure: LAPAROSCOPIC VENTRAL HERNIA;  Surgeon: Robert Bellow, MD;  Location: [facility];  Service: General;  Laterality: N/A;    Family History  Problem Relation Age of Onset  . Diabetes Paternal Grandmother   . Cancer Paternal Grandmother 65       breast  . Diabetes Mother   . Diabetes Father   . Stroke Maternal Grandmother   . Diabetes Maternal Grandfather   . Cancer Other        stomach, lung (smoker)  . Coronary artery disease Neg Hx     Social History Social History   Tobacco Use  . Smoking status: Never Smoker  . Smokeless tobacco: Never Used  Substance Use Topics  . Alcohol use: Yes    Alcohol/week: 0.0 standard drinks    Comment: rare  . Drug use: No    Allergies   Allergen Reactions  . Penicillins Rash    Has patient had a PCN reaction causing immediate rash, facial/tongue/throat swelling, SOB or lightheadedness with hypotension: Yes Has patient had a PCN reaction causing severe rash involving mucus membranes or skin necrosis: No Has patient had a PCN reaction that required hospitalization: Unknown Has patient had a PCN reaction occurring within the last 10 years: No If all of the above answers are "NO", then may proceed with Cephalosporin use.     Current Outpatient Medications  Medication Sig Dispense Refill  . ACCU-CHEK FASTCLIX LANCETS MISC Check blood sugar once daily and as needed. Dx E11.9 100 each 1  . ACCU-CHEK GUIDE test strip CHECK BLOOD SUGAR ONCE DAILY AND AS NEEDED. DX E11.9 100 each 3  . Blood Glucose Monitoring Suppl (ACCU-CHEK GUIDE) w/Device KIT 1 Device by Does not apply route daily. Dx E11.9 1 kit 0  . lisinopril (PRINIVIL,ZESTRIL) 10 MG tablet Take 1 tablet (10 mg total) by mouth daily. 30 tablet 6  . metFORMIN (GLUCOPHAGE) 1000 MG tablet Take 1 tablet (1,000 mg total) by mouth 2 (two) times daily with a meal. 60 tablet 6  . triamcinolone cream (KENALOG) 0.1 % Apply 1 application topically 2 (two) times daily. Apply to AA. 80 g 0   Current Facility-Administered Medications  Medication Dose Route Frequency Provider Last Rate Last Dose  . betamethasone acetate-betamethasone sodium phosphate (CELESTONE) injection 3 mg  3 mg Intramuscular Once Edrick Kins, DPM        Review of Systems Review of Systems  Constitutional: Negative.   Respiratory: Negative.   Cardiovascular: Negative.     Blood pressure (!) 144/82, pulse 80, resp. rate 12, height '5\' 10"'  (1.778 m), weight 290 lb (131.5 kg), SpO2 95 %.  Physical Exam Physical Exam  Constitutional: He is oriented to person, place, and time. He appears well-developed and well-nourished.  Abdominal: Soft. There is no tenderness.    Hernia repair intact  Neurological: He is  alert and oriented to person, place, and time.  Skin: Skin is warm and dry.  Psychiatric: His behavior is normal.       Assessment    Doing well now 4 months post laparoscopic repair of a recurrent ventral hernia.    Plan    Follow up as needed.  Proper lifting techniques reviewed.  Weight loss encouraged.      HPI, Physical Exam, Assessment and Plan have been scribed under the direction and in the presence of Robert Bellow, MD. Karie Fetch, RN  I have completed the exam and reviewed the above documentation for accuracy and completeness.  I agree with the above.  [employer] has been used and any errors in dictation or transcription are unintentional.  Hervey Ard, M.D., F.A.C.S.\   Forest Gleason Fayetta Sorenson 06/10/2018, 5:26 PM

## 2018-06-09 NOTE — Patient Instructions (Signed)
The patient is aware to call back for any questions or concerns.  

## 2018-06-24 ENCOUNTER — Other Ambulatory Visit: Payer: Self-pay | Admitting: Family Medicine

## 2018-06-24 ENCOUNTER — Other Ambulatory Visit (INDEPENDENT_AMBULATORY_CARE_PROVIDER_SITE_OTHER): Payer: 59

## 2018-06-24 DIAGNOSIS — M1000 Idiopathic gout, unspecified site: Secondary | ICD-10-CM

## 2018-06-24 DIAGNOSIS — E041 Nontoxic single thyroid nodule: Secondary | ICD-10-CM

## 2018-06-24 DIAGNOSIS — E1142 Type 2 diabetes mellitus with diabetic polyneuropathy: Secondary | ICD-10-CM

## 2018-06-24 DIAGNOSIS — E785 Hyperlipidemia, unspecified: Secondary | ICD-10-CM

## 2018-06-24 LAB — LIPID PANEL
CHOL/HDL RATIO: 5
CHOLESTEROL: 152 mg/dL (ref 0–200)
HDL: 31.1 mg/dL — ABNORMAL LOW (ref >39.00)
LDL Cholesterol: 93 mg/dL (ref 0–99)
NonHDL: 121.34
TRIGLYCERIDES: 142 mg/dL (ref 0.0–149.0)
VLDL: 28.4 mg/dL (ref 0.0–40.0)

## 2018-06-24 LAB — COMPREHENSIVE METABOLIC PANEL
ALBUMIN: 4.2 g/dL (ref 3.5–5.2)
ALK PHOS: 52 U/L (ref 39–117)
ALT: 30 U/L (ref 0–53)
AST: 17 U/L (ref 0–37)
BILIRUBIN TOTAL: 0.3 mg/dL (ref 0.2–1.2)
BUN: 11 mg/dL (ref 6–23)
CALCIUM: 9.7 mg/dL (ref 8.4–10.5)
CO2: 28 meq/L (ref 19–32)
CREATININE: 0.87 mg/dL (ref 0.40–1.50)
Chloride: 103 mEq/L (ref 96–112)
GFR: 101.92 mL/min (ref >60.00)
Glucose, Bld: 109 mg/dL — ABNORMAL HIGH (ref 70–99)
Potassium: 4.1 mEq/L (ref 3.5–5.1)
Sodium: 139 mEq/L (ref 135–145)
Total Protein: 7.6 g/dL (ref 6.0–8.3)

## 2018-06-24 LAB — TSH: TSH: 2.97 u[IU]/mL (ref 0.35–4.50)

## 2018-06-24 LAB — URIC ACID: Uric Acid, Serum: 7.7 mg/dL (ref 4.0–7.8)

## 2018-06-24 LAB — HEMOGLOBIN A1C: Hgb A1c MFr Bld: 7.4 % — ABNORMAL HIGH (ref 4.6–6.5)

## 2018-06-29 ENCOUNTER — Ambulatory Visit (INDEPENDENT_AMBULATORY_CARE_PROVIDER_SITE_OTHER): Payer: 59 | Admitting: Family Medicine

## 2018-06-29 ENCOUNTER — Encounter: Payer: Self-pay | Admitting: Family Medicine

## 2018-06-29 VITALS — BP 124/78 | HR 96 | Temp 97.8°F | Ht 70.0 in | Wt 286.2 lb

## 2018-06-29 DIAGNOSIS — M1A00X0 Idiopathic chronic gout, unspecified site, without tophus (tophi): Secondary | ICD-10-CM

## 2018-06-29 DIAGNOSIS — E785 Hyperlipidemia, unspecified: Secondary | ICD-10-CM

## 2018-06-29 DIAGNOSIS — Z0000 Encounter for general adult medical examination without abnormal findings: Secondary | ICD-10-CM

## 2018-06-29 DIAGNOSIS — I10 Essential (primary) hypertension: Secondary | ICD-10-CM

## 2018-06-29 DIAGNOSIS — Z23 Encounter for immunization: Secondary | ICD-10-CM | POA: Diagnosis not present

## 2018-06-29 DIAGNOSIS — E1142 Type 2 diabetes mellitus with diabetic polyneuropathy: Secondary | ICD-10-CM

## 2018-06-29 MED ORDER — LOSARTAN POTASSIUM 50 MG PO TABS: 50.0000 mg | ORAL_TABLET | Freq: Every day | ORAL | 3 refills | Status: DC

## 2018-06-29 NOTE — Patient Instructions (Addendum)
Flu shot today Stop lisinopril, start losartan 50mg  daily.  Schedule eye exam Continue working on regular exercise program.  Work on low sugar low carb diabetic diet. Return as needed or in 4 months for follow up visit.   Health Maintenance, Male A healthy lifestyle and preventive care is important for your health and wellness. Ask your health care provider about what schedule of regular examinations is right for you. What should I know about weight and diet? Eat a Healthy Diet  Eat plenty of vegetables, fruits, whole grains, low-fat dairy products, and lean protein.  Do not eat a lot of foods high in solid fats, added sugars, or salt.  Maintain a Healthy Weight Regular exercise can help you achieve or maintain a healthy weight. You should:  Do at least 150 minutes of exercise each week. The exercise should increase your heart rate and make you sweat (moderate-intensity exercise).  Do strength-training exercises at least twice a week.  Watch Your Levels of Cholesterol and Blood Lipids  Have your blood tested for lipids and cholesterol every 5 years starting at 43 years of age. If you are at high risk for heart disease, you should start having your blood tested when you are 43 years old. You may need to have your cholesterol levels checked more often if: ? Your lipid or cholesterol levels are high. ? You are older than 43 years of age. ? You are at high risk for heart disease.  What should I know about cancer screening? Many types of cancers can be detected early and may often be prevented. Lung Cancer  You should be screened every year for lung cancer if: ? You are a current smoker who has smoked for at least 30 years. ? You are a former smoker who has quit within the past 15 years.  Talk to your health care provider about your screening options, when you should start screening, and how often you should be screened.  Colorectal Cancer  Routine colorectal cancer screening  usually begins at 43 years of age and should be repeated every 5-10 years until you are 43 years old. You may need to be screened more often if early forms of precancerous polyps or small growths are found. Your health care provider may recommend screening at an earlier age if you have risk factors for colon cancer.  Your health care provider may recommend using home test kits to check for hidden blood in the stool.  A small camera at the end of a tube can be used to examine your colon (sigmoidoscopy or colonoscopy). This checks for the earliest forms of colorectal cancer.  Prostate and Testicular Cancer  Depending on your age and overall health, your health care provider may do certain tests to screen for prostate and testicular cancer.  Talk to your health care provider about any symptoms or concerns you have about testicular or prostate cancer.  Skin Cancer  Check your skin from head to toe regularly.  Tell your health care provider about any new moles or changes in moles, especially if: ? There is a change in a mole's size, shape, or color. ? You have a mole that is larger than a pencil eraser.  Always use sunscreen. Apply sunscreen liberally and repeat throughout the day.  Protect yourself by wearing long sleeves, pants, a wide-brimmed hat, and sunglasses when outside.  What should I know about heart disease, diabetes, and high blood pressure?  If you are 42-66 years of age, have your  blood pressure checked every 3-5 years. If you are 86 years of age or older, have your blood pressure checked every year. You should have your blood pressure measured twice-once when you are at a hospital or clinic, and once when you are not at a hospital or clinic. Record the average of the two measurements. To check your blood pressure when you are not at a hospital or clinic, you can use: ? An automated blood pressure machine at a pharmacy. ? A home blood pressure monitor.  Talk to your health care  provider about your target blood pressure.  If you are between 16-43 years old, ask your health care provider if you should take aspirin to prevent heart disease.  Have regular diabetes screenings by checking your fasting blood sugar level. ? If you are at a normal weight and have a low risk for diabetes, have this test once every three years after the age of 31. ? If you are overweight and have a high risk for diabetes, consider being tested at a younger age or more often.  A one-time screening for abdominal aortic aneurysm (AAA) by ultrasound is recommended for men aged 87-75 years who are current or former smokers. What should I know about preventing infection? Hepatitis B If you have a higher risk for hepatitis B, you should be screened for this virus. Talk with your health care provider to find out if you are at risk for hepatitis B infection. Hepatitis C Blood testing is recommended for:  Everyone born from [birth year] through 1966.  Anyone with known risk factors for hepatitis C.  Sexually Transmitted Diseases (STDs)  You should be screened each year for STDs including gonorrhea and chlamydia if: ? You are sexually active and are younger than 43 years of age. ? You are older than 43 years of age and your health care provider tells you that you are at risk for this type of infection. ? Your sexual activity has changed since you were last screened and you are at an increased risk for chlamydia or gonorrhea. Ask your health care provider if you are at risk.  Talk with your health care provider about whether you are at high risk of being infected with HIV. Your health care provider may recommend a prescription medicine to help prevent HIV infection.  What else can I do?  Schedule regular health, dental, and eye exams.  Stay current with your vaccines (immunizations).  Do not use any tobacco products, such as cigarettes, chewing tobacco, and e-cigarettes. If you need help quitting, ask  your health care provider.  Limit alcohol intake to no more than 2 drinks per day. One drink equals 12 ounces of beer, 5 ounces of wine, or 1 ounces of hard liquor.  Do not use street drugs.  Do not share needles.  Ask your health care provider for help if you need support or information about quitting drugs.  Tell your health care provider if you often feel depressed.  Tell your health care provider if you have ever been abused or do not feel safe at home. This information is not intended to replace advice given to you by your health care provider. Make sure you discuss any questions you have with your health care provider. Document Released: 04/04/2008 Document Revised: 06/05/2016 Document Reviewed: 07/11/2015 Elsevier Interactive Patient Education  [employer].

## 2018-06-29 NOTE — Assessment & Plan Note (Signed)
Preventative protocols reviewed and updated unless pt declined. Discussed healthy diet and lifestyle.  

## 2018-06-29 NOTE — Assessment & Plan Note (Signed)
Denies recent gout flares so will not start daily urate lowering medication.

## 2018-06-29 NOTE — Assessment & Plan Note (Signed)
Chronic, deteriorated control. He declines additional medication at this time. Encouraged renewed efforts to follow diabetic diet.

## 2018-06-29 NOTE — Assessment & Plan Note (Signed)
Chronic, improved with lisinopril but may be causing throat irritation - will start losartan 50mg  daily.

## 2018-06-29 NOTE — Assessment & Plan Note (Signed)
Encouraged healthy diet and lifestyle changes to affect sustainable weight loss.  

## 2018-06-29 NOTE — Progress Notes (Signed)
BP 124/78 (BP Location: Left Arm, Patient Position: Sitting, Cuff Size: Large)   Pulse 96   Temp 97.8 F (36.6 C) (Oral)   Ht '5\' 10"'  (1.778 m)   Wt 286 lb 4 oz (129.8 kg)   SpO2 96%   BMI 41.07 kg/m    CC: CPE Subjective:    Patient ID: Jorge Boyle, male    DOB: 07-16-1976, 42 y.o.   MRN: [id number]  HPI: Jorge Boyle is a 42 y.o. male presenting on 06/29/2018 for Annual Exam (Provided copy [our copy] of recent BP readings.)   Brings BP log - 120-140/80s largely well controlled. Lisinopril may be causing dry cough. He also regularly checks sugars but did not bring log.   Preventative: Flu - yearly Tdap 12/2011  Pneumovax 2016 Seat belt use discussed. Sunscreen use discussed. Denies changing moles on skin.  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 Interim medical history since our last visit reviewed. Allergies and medications reviewed and updated. Outpatient Medications Prior to Visit  Medication Sig Dispense Refill  . ACCU-CHEK FASTCLIX LANCETS MISC Check blood sugar once daily and as needed. Dx E11.9 100 each 1  . ACCU-CHEK GUIDE test strip CHECK BLOOD SUGAR ONCE DAILY AND AS NEEDED. DX E11.9 100 each 3  . Blood Glucose Monitoring Suppl (ACCU-CHEK GUIDE) w/Device KIT 1 Device by Does not apply route daily. Dx E11.9 1 kit 0  . metFORMIN (GLUCOPHAGE) 1000 MG tablet Take 1 tablet (1,000 mg total) by mouth 2 (two) times daily with a meal. 60 tablet 6  . triamcinolone cream (KENALOG) 0.1 % Apply 1 application topically 2 (two) times daily. Apply to AA. 80 g 0  . lisinopril (PRINIVIL,ZESTRIL) 10 MG tablet Take 1 tablet (10 mg total) by  mouth daily. 30 tablet 6   Facility-Administered Medications Prior to Visit  Medication Dose Route Frequency Provider Last Rate Last Dose  . betamethasone acetate-betamethasone sodium phosphate (CELESTONE) injection 3 mg  3 mg Intramuscular Once Edrick Kins, DPM         Per HPI unless specifically indicated in ROS section below Review of Systems  Constitutional: Negative for activity change, appetite change, chills, fatigue, fever and unexpected weight change.  HENT: Negative for hearing loss.   Eyes: Negative for visual disturbance.  Respiratory: Negative for cough, chest tightness, shortness of breath and wheezing.   Cardiovascular: Negative for chest pain, palpitations and leg swelling.  Gastrointestinal: Negative for abdominal distention, abdominal pain, blood in stool, constipation, diarrhea, nausea and vomiting.  Genitourinary: Negative for difficulty urinating and hematuria.  Musculoskeletal: Negative for arthralgias, myalgias and neck pain.  Skin: Negative for rash.  Neurological: Positive for headaches (attributes to tooth problems). Negative for dizziness, seizures and syncope.  Hematological: Negative for adenopathy. Does not bruise/bleed easily.  Psychiatric/Behavioral: Negative for dysphoric mood. The patient is not nervous/anxious.        Objective:    BP 124/78 (BP Location: Left Arm, Patient Position: Sitting, Cuff Size: Large)   Pulse 96   Temp 97.8 F (36.6 C) (Oral)   Ht '5\' 10"'  (1.778 m)   Wt 286 lb 4 oz (129.8 kg)   SpO2 96%   BMI  41.07 kg/m   Wt Readings from Last 3 Encounters:  06/29/18 286 lb 4 oz (129.8 kg)  06/09/18 290 lb (131.5 kg)  04/21/18 286 lb 8 oz (130 kg)    Physical Exam  Constitutional: He is oriented to person, place, and time. He appears well-developed and well-nourished. No distress.  HENT:  Head: Normocephalic and atraumatic.  Right Ear: Hearing, tympanic membrane, external ear and ear canal normal.  Left Ear: Hearing, tympanic  membrane, external ear and ear canal normal.  Nose: Nose normal.  Mouth/Throat: Uvula is midline, oropharynx is clear and moist and mucous membranes are normal. No oropharyngeal exudate, posterior oropharyngeal edema or posterior oropharyngeal erythema.  Eyes: Pupils are equal, round, and reactive to light. Conjunctivae and EOM are normal. No scleral icterus.  Neck: Normal range of motion. Neck supple. No thyromegaly present.  Cardiovascular: Normal rate, regular rhythm, normal heart sounds and intact distal pulses.  No murmur heard. Pulses:      Radial pulses are 2+ on the right side, and 2+ on the left side.  Pulmonary/Chest: Effort normal and breath sounds normal. No respiratory distress. He has no wheezes. He has no rales.  Abdominal: Soft. Bowel sounds are normal. He exhibits no distension and no mass. There is no tenderness. There is no rebound and no guarding.  Musculoskeletal: Normal range of motion. He exhibits no edema.  Lymphadenopathy:    He has no cervical adenopathy.  Neurological: He is alert and oriented to person, place, and time.  CN grossly intact, station and gait intact  Skin: Skin is warm and dry. No rash noted.  Psychiatric: He has a normal mood and affect. His behavior is normal. Judgment and thought content normal.  Nursing note and vitals reviewed.  Results for orders placed or performed in visit on 06/24/18  Uric acid  Result Value Ref Range   Uric Acid, Serum 7.7 4.0 - 7.8 mg/dL  Hemoglobin A1c  Result Value Ref Range   Hgb A1c MFr Bld 7.4 (H) 4.6 - 6.5 %  TSH  Result Value Ref Range   TSH 2.97 0.35 - 4.50 uIU/mL  Comprehensive metabolic panel  Result Value Ref Range   Sodium 139 135 - 145 mEq/L   Potassium 4.1 3.5 - 5.1 mEq/L   Chloride 103 96 - 112 mEq/L   CO2 28 19 - 32 mEq/L   Glucose, Bld 109 (H) 70 - 99 mg/dL   BUN 11 6 - 23 mg/dL   Creatinine, Ser 0.87 0.40 - 1.50 mg/dL   Total Bilirubin 0.3 0.2 - 1.2 mg/dL   Alkaline Phosphatase 52 39 - 117  U/L   AST 17 0 - 37 U/L   ALT 30 0 - 53 U/L   Total Protein 7.6 6.0 - 8.3 g/dL   Albumin 4.2 3.5 - 5.2 g/dL   Calcium 9.7 8.4 - 10.5 mg/dL   GFR 101.92 >60.00 mL/min  Lipid panel  Result Value Ref Range   Cholesterol 152 0 - 200 mg/dL   Triglycerides 142.0 0.0 - 149.0 mg/dL   HDL 31.10 (L) >39.00 mg/dL   VLDL 28.4 0.0 - 40.0 mg/dL   LDL Cholesterol 93 0 - 99 mg/dL   Total CHOL/HDL Ratio 5    NonHDL 121.34       Assessment & Plan:   Problem List Items Addressed This Visit    Type 2 diabetes, controlled, with peripheral neuropathy (HCC)    Chronic, deteriorated control. He declines additional medication at this time. Encouraged renewed efforts  to follow diabetic diet.      Relevant Medications   losartan (COZAAR) 50 MG tablet   Obesity, morbid, BMI 40.0-49.9 ([facility])    Encouraged healthy diet and lifestyle changes to affect sustainable weight loss.       Health maintenance examination - Primary    Preventative protocols reviewed and updated unless pt declined. Discussed healthy diet and lifestyle.       Gout    Denies recent gout flares so will not start daily urate lowering medication.      Essential hypertension    Chronic, improved with lisinopril but may be causing throat irritation - will start losartan 78m daily.       Relevant Medications   losartan (COZAAR) 50 MG tablet   Dyslipidemia    Chronic, actually some improvement noted. Off med. The 10-year ASCVD risk score (Mikey BussingDC JBrooke Bonito, et al., 2013) is: 3.5%   Values used to calculate the score:     Age: [age]years     Sex: Male     Is Non-Hispanic African American: No     Diabetic: Yes     Tobacco smoker: No     Systolic Blood Pressure: 1196mmHg     Is BP treated: Yes     HDL Cholesterol: 31.1 mg/dL     Total Cholesterol: 152 mg/dL        Other Visit Diagnoses    Need for influenza vaccination       Relevant Orders   Flu Vaccine QUAD 36+ mos IM (Completed)       Meds ordered this encounter    Medications  . losartan (COZAAR) 50 MG tablet    Sig: Take 1 tablet (50 mg total) by mouth daily.    Dispense:  90 tablet    Refill:  3   Orders Placed This Encounter  Procedures  . Flu Vaccine QUAD 36+ mos IM    Follow up plan: Return in about 4 months (around 10/29/2018) for follow up visit.  JRia Bush MD

## 2018-06-29 NOTE — Assessment & Plan Note (Signed)
Chronic, actually some improvement noted. Off med. The 10-year ASCVD risk score Mikey Bussing DC Brooke Bonito., et al., 2013) is: 3.5%   Values used to calculate the score:     Age: 42 years     Sex: Male     Is Non-Hispanic African American: No     Diabetic: Yes     Tobacco smoker: No     Systolic Blood Pressure: 518 mmHg     Is BP treated: Yes     HDL Cholesterol: 31.1 mg/dL     Total Cholesterol: 152 mg/dL

## 2018-08-05 IMAGING — CR DG LUMBAR SPINE COMPLETE 4+V
1 series · 5 of 5 positions shown · non-contrast
Comparison: None.

CLINICAL DATA: 40 y/o M; lower back pain radiating down the left
leg.

EXAM:
LUMBAR SPINE - COMPLETE 4+ VIEW

[Series 1: dg lumbar spine complete 4 +v · 0.14mm/px · 5 of 5 slices shown]
[im 1/5]
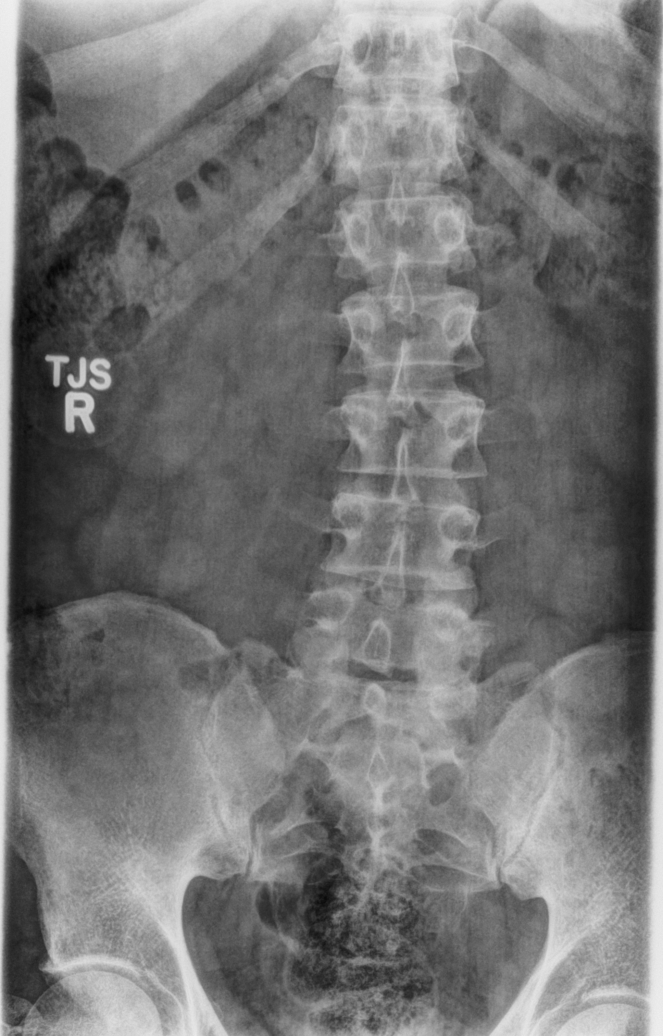
[im 2/5]
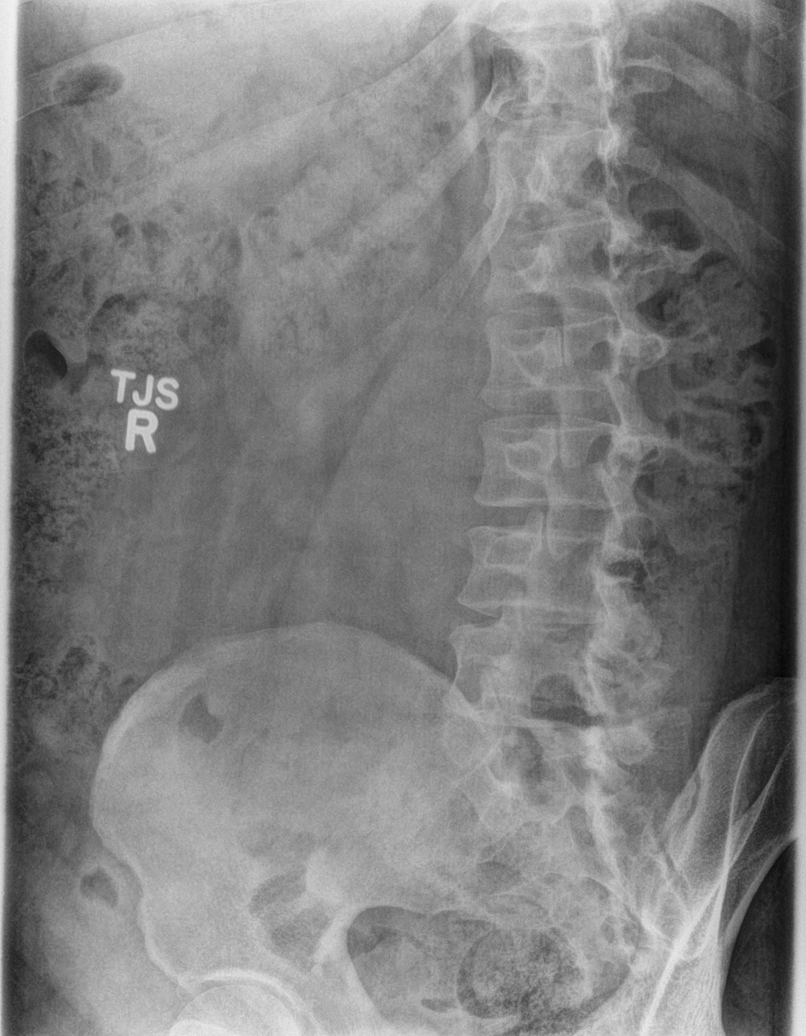
[im 3/5]
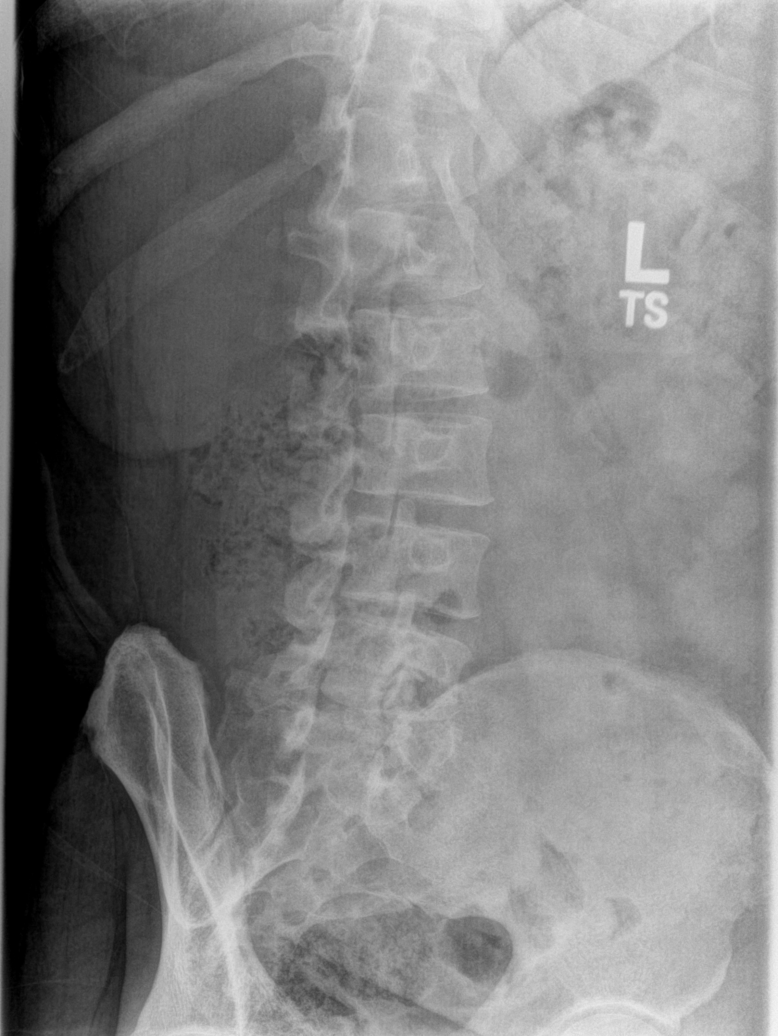
[im 4/5]
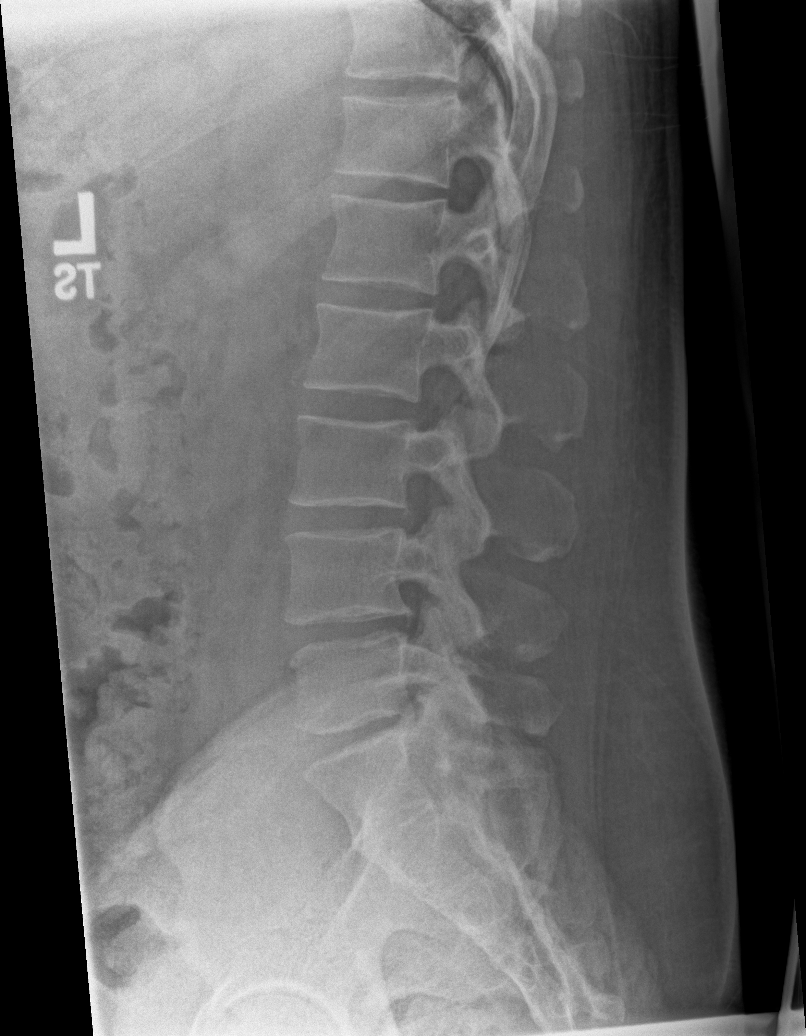
[im 5/5]
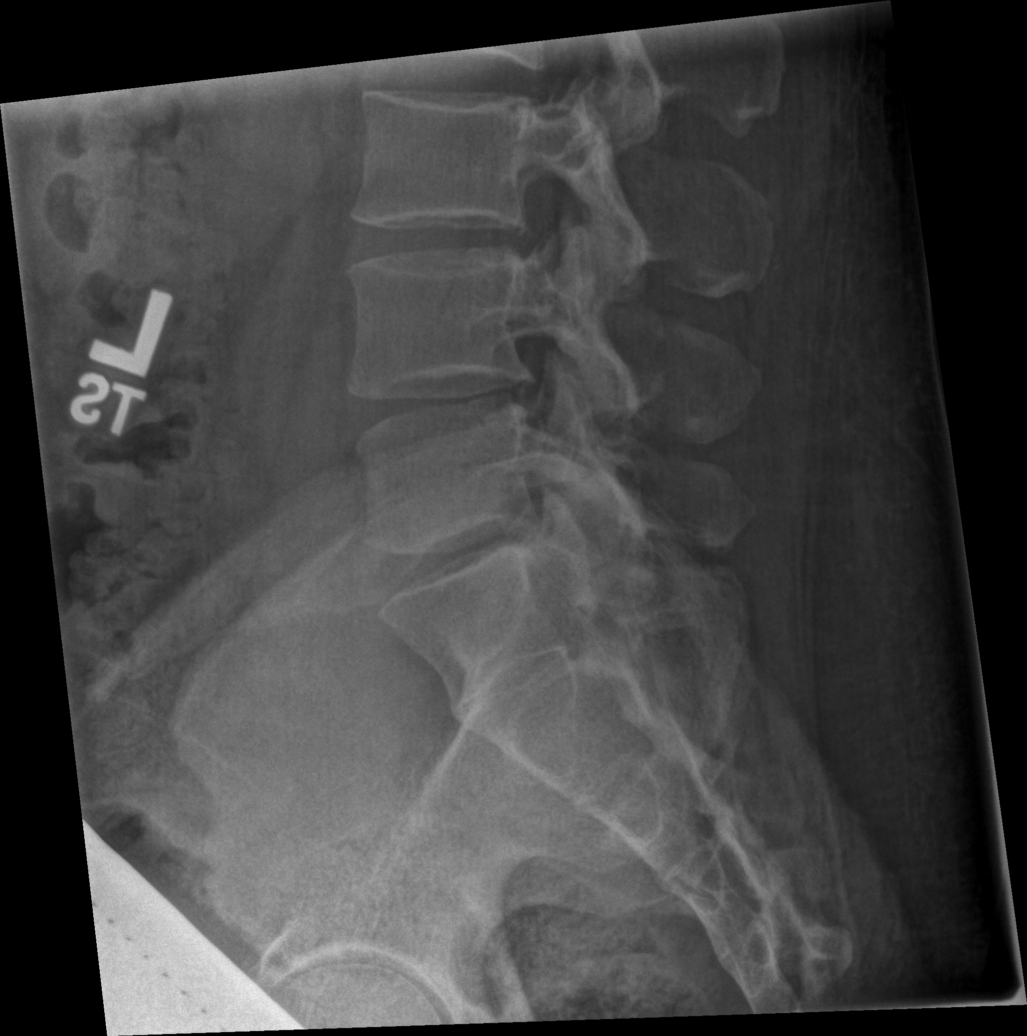

[5 of 5 positions shown; findings below may reference images not displayed]

FINDINGS: Mild lumbar levocurvature with apex at L5. 5 lumbar type
non-rib-bearing vertebral bodies. Straightening of lumbar lordosis.
No listhesis. Vertebral body heights are preserved. Mild loss of
height of the L4 through S1 intervertebral disc spaces. Mild lower
lumbar facet arthrosis.
IMPRESSION: 1. No acute fracture or dislocation of the lumbar spine.
2. Mild lumbar levocurvature.
3. Lumbar spondylosis predominantly at L4 through S1 with there is
mild loss of disc space height and facet arthrosis.

By: Lorenzo Koffi M.D.

## 2018-09-07 ENCOUNTER — Encounter: Payer: Self-pay | Admitting: Family Medicine

## 2018-09-07 ENCOUNTER — Ambulatory Visit: Payer: 59 | Admitting: Family Medicine

## 2018-09-07 VITALS — BP 146/78 | HR 97 | Temp 99.3°F | Ht 70.0 in | Wt 288.4 lb

## 2018-09-07 DIAGNOSIS — J029 Acute pharyngitis, unspecified: Secondary | ICD-10-CM | POA: Diagnosis not present

## 2018-09-07 DIAGNOSIS — J020 Streptococcal pharyngitis: Secondary | ICD-10-CM | POA: Diagnosis not present

## 2018-09-07 LAB — POCT RAPID STREP A (OFFICE): RAPID STREP A SCREEN: POSITIVE — AB

## 2018-09-07 MED ORDER — AZITHROMYCIN 500 MG PO TABS: 500.0000 mg | ORAL_TABLET | Freq: Every day | ORAL | 0 refills | Status: AC

## 2018-09-07 NOTE — Patient Instructions (Signed)

## 2018-09-07 NOTE — Progress Notes (Signed)
Subjective:    Patient ID: Jorge Boyle, male    DOB: [birth year], 42 y.o.   MRN: [id number]  HPI This is a 42 yo male who presents today with sore throat x 2 days. Has had URI symptoms, mild cough, runny nose, muscle aches, had sudden onset of throat pain. Has had tonsillectomy. No known fever. No SOB or wheeze, some headache. Blood sugars around 170 which is high for him. Worked last night.   Past Medical History:  Diagnosis Date  . Allergic rhinitis   . Diabetes mellitus without complication ([place])   . Difficult intubation   . Headache   . History of chicken pox   . History of kidney stones   . Multiple thyroid nodules 01/2012   left, biopsy x2 benign, rpt [place] stable (01/2013, 07/2015) rec rpt 1 yr  . Nontoxic nodular goiter   . Obesity   . Prediabetes   . S/P excision of thyroid adenoma 2005   R thyroidectomy   Past Surgical History:  Procedure Laterality Date  . CYSTOSCOPY/URETEROSCOPY/HOLMIUM LASER/STENT PLACEMENT Left 02/02/2018   Procedure: CYSTOSCOPY/URETEROSCOPY/HOLMIUM LASER/STENT PLACEMENT;  Surgeon: Hollice Espy, MD;  Location: [facility] ORS;  Service: Urology;  Laterality: Left;  . THYROIDECTOMY, PARTIAL  2005   Right and isthmus. Dr Nadeen Landau  . TONSILLECTOMY AND ADENOIDECTOMY  1985  . UMBILICAL HERNIA REPAIR  12/30/2013   [place]  . VENTRAL HERNIA REPAIR N/A 02/02/2018   Procedure: LAPAROSCOPIC VENTRAL HERNIA;  Surgeon: Robert Bellow, MD;  Location: [facility] ORS;  Service: General;  Laterality: N/A;   Family History  Problem Relation Age of Onset  . Diabetes Paternal Grandmother   . Cancer Paternal Grandmother 40       breast  . Diabetes Mother   . Diabetes Father   . Stroke Maternal Grandmother   . Diabetes Maternal Grandfather   . Cancer Other        stomach, lung (smoker)  . Coronary artery disease Neg Hx    Social History   Tobacco Use  . Smoking status: Never Smoker  . Smokeless tobacco: Never Used  Substance Use Topics  . Alcohol use: Yes     Alcohol/week: 0.0 standard drinks    Comment: rare  . Drug use: No      Review of Systems Per HPI    Objective:   Physical Exam  Constitutional: He appears well-developed and well-nourished. No distress.  HENT:  Head: Normocephalic and atraumatic.  Right Ear: Tympanic membrane, external ear and ear canal normal.  Left Ear: Tympanic membrane, external ear and ear canal normal.  Nose: Nose normal.  Mouth/Throat: Mucous membranes are normal.  Unable to visualize his oropharynx due to his tongue. Attempted to depress tongue but unable due to patient's strong gag reflex. He is able to demonstrate swallowing.   Eyes: Conjunctivae are normal.  Neck: Normal range of motion. Neck supple.  Cardiovascular: Normal rate, regular rhythm and normal heart sounds.  Pulmonary/Chest: Effort normal and breath sounds normal.  Skin: Skin is warm and dry. He is not diaphoretic.  Psychiatric: He has a normal mood and affect. His behavior is normal. Judgment and thought content normal.  Vitals reviewed.     BP (!) 146/78   Pulse 97   Temp 99.3 F (37.4 C) (Oral)   Ht 5\' 10"  (1.778 m)   Wt 288 lb 6.4 oz (130.8 kg)   SpO2 96%   BMI 41.38 kg/m  BP Readings from Last 3 Encounters:  09/07/18 (!) 146/78  06/29/18 124/78  06/09/18 (!) 144/82   Results for orders placed or performed in visit on 09/07/18  POCT rapid strep A  Result Value Ref Range   Rapid Strep A Screen Positive (A) Negative       Assessment & Plan:  1. Sorethroat - POCT rapid strep A  2. Strep pharyngitis - Provided written and verbal information regarding diagnosis and treatment. - RTC precautions reviewed - azithromycin (ZITHROMAX) 500 MG tablet; Take 1 tablet (500 mg total) by mouth daily for 5 days.  Dispense: 5 tablet; Refill: 0 - OOW tonight, can extend if needed - discussed likelihood of increased blood glucose levels while ill, encouraged him to drink adequate water, rest and watch carb intake  Clarene Reamer, FNP-BC  [facility], [place]  09/07/2018 3:25 PM

## 2018-09-07 NOTE — Progress Notes (Signed)
Pre visit review using our clinic review tool, if applicable. No additional management support is needed unless otherwise documented below in the visit note. 

## 2018-09-24 ENCOUNTER — Encounter: Payer: Self-pay | Admitting: Family Medicine

## 2018-09-24 ENCOUNTER — Other Ambulatory Visit: Payer: Self-pay | Admitting: Family Medicine

## 2018-09-24 NOTE — Telephone Encounter (Signed)
[facility] is stating losartan 50 mg is on backorder.

## 2018-09-25 MED ORDER — LOSARTAN POTASSIUM 100 MG PO TABS: 100.0000 mg | ORAL_TABLET | Freq: Every day | ORAL | 1 refills | Status: DC

## 2018-09-29 NOTE — Telephone Encounter (Signed)
E prescribing error. Please call med in - ok to do 100mg  dose and cut in half until backorder ends.

## 2018-09-29 NOTE — Telephone Encounter (Signed)
Spoke with [facility] informing them to disregard rx sent on 09/25/18.  Provided new rx info, 100 mg tab, take 1/2 tab PO daily until backorder ends.  Verbalizes understanding and says they will deactivate 09/25/18 rx and fill new.

## 2018-10-29 ENCOUNTER — Encounter: Payer: Self-pay | Admitting: Family Medicine

## 2018-10-29 NOTE — Progress Notes (Signed)
BP 120/72 (BP Location: Left Arm, Patient Position: Sitting, Cuff Size: Large)   Pulse 74   Temp 97.8 F (36.6 C) (Oral)   Ht _0  (1.778 m)   Wt 290 lb 8 oz (131.8 kg)   SpO2 96%   BMI 41.68 kg/m    CC: 4 mo f/u visit Subjective:    Patient ID: Jorge Boyle, male    DOB: 08-08-1976, 43 y.o.   MRN: [id number]  HPI: Jorge Boyle is a 43 y.o. male presenting on 10/30/2018 for Follow-up (Here for 4 mo f/u. ) and Medication Refill (Pt now uses [facility] and needs new rxs for meds.)    HTN - Compliant with current antihypertensive regimen. Lisinopril changed to losartan 80m ?contributing to throat irritation. Losartan was changed to 1083m1/2 daily as lower dose was on back order. He is currently taking 10077mhole tablet daily. Does check blood pressures at home: and brings log which was reviewed. 130-140/80-90. No low blood pressure readings or symptoms of dizziness/syncope.  Denies vision changes, CP/tightness, SOB, leg swelling.    Endorses daily headaches. Needs to see eye doctor.   DM - does regularly check sugars and brings log. Fasting 110-140, post prandial 110-200, before dinner 90-120. Compliant with antihyperglycemic regimen which includes: metformin 1000m79md. Denies low sugars or hypoglycemic symptoms. Denies paresthesias. Last diabetic eye exam DUE. Pneumovax: 2016. Prevnar: not due. Glucometer brand: accu-chek. DSME: not interested. Tries to go to gym.  Lab Results  Component Value Date   HGBA1C 7.2 (A) 10/30/2018   Diabetic Foot Exam - Simple   Simple Foot Form Diabetic Foot exam was performed with the following findings:  Yes 10/30/2018  2:38 PM  Visual Inspection No deformities, no ulcerations, no other skin breakdown bilaterally:  Yes Sensation Testing Intact to touch and monofilament testing bilaterally:  Yes Pulse Check Posterior Tibialis and Dorsalis pulse intact bilaterally:  Yes Comments Calluses bilateral medial great toes    Lab  Results  Component Value Date   MICROALBUR 0.8 10/28/2017        Relevant past medical, surgical, family and social history reviewed and updated as indicated. Interim medical history since our last visit reviewed. Allergies and medications reviewed and updated. Outpatient Medications Prior to Visit  Medication Sig Dispense Refill  . ACCU-CHEK FASTCLIX LANCETS MISC Check blood sugar once daily and as needed. Dx E11.9 100 each 1  . ACCU-CHEK GUIDE test strip CHECK BLOOD SUGAR ONCE DAILY AND AS NEEDED. DX E11.9 100 each 3  . Blood Glucose Monitoring Suppl (ACCU-CHEK GUIDE) w/Device KIT 1 Device by Does not apply route daily. Dx E11.9 1 kit 0  . losartan (COZAAR) 100 MG tablet Take 0.5 tablets (50 mg total) by mouth daily. 45 tablet 0  . losartan (COZAAR) 100 MG tablet Take 1 tablet (100 mg total) by mouth daily. 90 tablet 1  . metFORMIN (GLUCOPHAGE) 1000 MG tablet Take 1 tablet (1,000 mg total) by mouth 2 (two) times daily with a meal. 60 tablet 6   Facility-Administered Medications Prior to Visit  Medication Dose Route Frequency Provider Last Rate Last Dose  . betamethasone acetate-betamethasone sodium phosphate (CELESTONE) injection 3 mg  3 mg Intramuscular Once EvanEdrick KinsM         Per HPI unless specifically indicated in ROS section below Review of Systems Objective:    BP 120/72 (BP Location: Left Arm, Patient Position: Sitting, Cuff Size: Large)   Pulse 74   Temp 97.8 F (  36.6 C) (Oral)   Ht _0  (1.778 m)   Wt 290 lb 8 oz (131.8 kg)   SpO2 96%   BMI 41.68 kg/m   Wt Readings from Last 3 Encounters:  10/30/18 290 lb 8 oz (131.8 kg)  09/07/18 288 lb 6.4 oz (130.8 kg)  06/29/18 286 lb 4 oz (129.8 kg)    Physical Exam Vitals signs and nursing note reviewed.  Constitutional:      General: He is not in acute distress.    Appearance: He is well-developed.  HENT:     Head: Normocephalic and atraumatic.     Right Ear: External ear normal.     Left Ear: External ear  normal.     Nose: Nose normal.     Mouth/Throat:     Pharynx: No oropharyngeal exudate.  Eyes:     General: No scleral icterus.    Conjunctiva/sclera: Conjunctivae normal.     Pupils: Pupils are equal, round, and reactive to light.  Neck:     Musculoskeletal: Normal range of motion and neck supple.  Cardiovascular:     Rate and Rhythm: Normal rate and regular rhythm.     Heart sounds: Normal heart sounds. No murmur.  Pulmonary:     Effort: Pulmonary effort is normal. No respiratory distress.     Breath sounds: Normal breath sounds. No wheezing or rales.  Musculoskeletal:     Comments: See HPI for foot exam if done  Lymphadenopathy:     Cervical: No cervical adenopathy.  Skin:    General: Skin is warm and dry.     Findings: No rash.       Results for orders placed or performed in visit on 10/30/18  POCT glycosylated hemoglobin (Hb A1C)  Result Value Ref Range   Hemoglobin A1C 7.2 (A) 4.0 - 5.6 %   HbA1c POC (<> result, manual entry)     HbA1c, POC (prediabetic range)     HbA1c, POC (controlled diabetic range)     Lab Results  Component Value Date   CHOL 152 06/24/2018   HDL 31.10 (L) 06/24/2018   LDLCALC 93 06/24/2018   LDLDIRECT 126 05/21/2013   TRIG 142.0 06/24/2018   CHOLHDL 5 06/24/2018    Lab Results  Component Value Date   CREATININE 0.87 06/24/2018   BUN 11 06/24/2018   NA 139 06/24/2018   K 4.1 06/24/2018   CL 103 06/24/2018   CO2 28 06/24/2018    Assessment & Plan:   Problem List Items Addressed This Visit    Type 2 diabetes, controlled, with peripheral neuropathy ([place]) - Primary    Chronic, slight improvement noted. Discussed goal A1c <7%, the lower the better. Congratulated on good sugar log his mother is keeping. Continue current medicines. RTC 4 mo f/u visit.       Relevant Medications   metFORMIN (GLUCOPHAGE) 1000 MG tablet   losartan (COZAAR) 100 MG tablet   Other Relevant Orders   POCT glycosylated hemoglobin (Hb A1C) (Completed)    Essential hypertension    Chronic, adequate control. Continue losartan 113m daily.       Relevant Medications   losartan (COZAAR) 100 MG tablet       Meds ordered this encounter  Medications  . metFORMIN (GLUCOPHAGE) 1000 MG tablet    Sig: Take 1 tablet (1,000 mg total) by mouth 2 (two) times daily with a meal.    Dispense:  180 tablet    Refill:  1  . losartan (COZAAR) 100  MG tablet    Sig: Take 1 tablet (100 mg total) by mouth daily.    Dispense:  90 tablet    Refill:  1   Orders Placed This Encounter  Procedures  . POCT glycosylated hemoglobin (Hb A1C)    Follow up plan: Return in about 4 months (around 02/28/2019) for follow up visit.  Ria Bush, MD

## 2018-10-30 ENCOUNTER — Ambulatory Visit: Payer: 59 | Admitting: Family Medicine

## 2018-10-30 ENCOUNTER — Encounter: Payer: Self-pay | Admitting: Family Medicine

## 2018-10-30 VITALS — BP 120/72 | HR 74 | Temp 97.8°F | Ht 70.0 in | Wt 290.5 lb

## 2018-10-30 DIAGNOSIS — I10 Essential (primary) hypertension: Secondary | ICD-10-CM | POA: Diagnosis not present

## 2018-10-30 DIAGNOSIS — E1142 Type 2 diabetes mellitus with diabetic polyneuropathy: Secondary | ICD-10-CM | POA: Diagnosis not present

## 2018-10-30 LAB — POCT GLYCOSYLATED HEMOGLOBIN (HGB A1C): HEMOGLOBIN A1C: 7.2 % — AB (ref 4.0–5.6)

## 2018-10-30 MED ORDER — LOSARTAN POTASSIUM 100 MG PO TABS: 100.0000 mg | ORAL_TABLET | Freq: Every day | ORAL | 1 refills | Status: DC

## 2018-10-30 MED ORDER — METFORMIN HCL 1000 MG PO TABS: 1000.0000 mg | ORAL_TABLET | Freq: Two times a day (BID) | ORAL | 1 refills | Status: DC

## 2018-10-30 NOTE — Assessment & Plan Note (Signed)
Chronic, slight improvement noted. Discussed goal A1c <7%, the lower the better. Congratulated on good sugar log his mother is keeping. Continue current medicines. RTC 4 mo f/u visit.

## 2018-10-30 NOTE — Assessment & Plan Note (Signed)
Chronic, adequate control. Continue losartan 100mg  daily.

## 2018-10-30 NOTE — Patient Instructions (Addendum)
You are doing well today. Return as needed or in 4 months for follow up visit.  Work on more regular gym routine.  Continue current medicines.

## 2018-11-03 ENCOUNTER — Encounter: Payer: Self-pay | Admitting: Family Medicine

## 2018-11-05 ENCOUNTER — Telehealth: Payer: Self-pay

## 2018-11-05 ENCOUNTER — Other Ambulatory Visit: Payer: Self-pay | Admitting: Family Medicine

## 2018-11-05 MED ORDER — GLUCOSE BLOOD VI STRP: 1.0000 | ORAL_STRIP | 0 refills | Status: DC | PRN

## 2018-11-05 MED ORDER — ONETOUCH ULTRA MINI W/DEVICE KIT: 1.0000 | PACK | 0 refills | Status: DC

## 2018-11-05 MED ORDER — ONETOUCH DELICA LANCETS 33G MISC: 1.0000 | 0 refills | Status: DC

## 2018-11-05 NOTE — Telephone Encounter (Signed)
Received MyChart message from pt stating his ins co covers the [employer] meter.  Requesting rx for new meter and strips. [See Pt Msg, 11/03/18.]  E-scribed kit, test strips and lancets. Notified pt via [place].

## 2018-11-06 NOTE — Telephone Encounter (Signed)
plz phone in one touch ultra 2 meter due to E prescribing error.

## 2018-11-06 NOTE — Telephone Encounter (Signed)
Received message from [facility] that the OneTouch Ultra Mini meter has been discontinued. Says pt can use OneTouch Ultra 2.   E-scribed OneTouch Ultra 2 meter.

## 2018-11-09 NOTE — Telephone Encounter (Signed)
Spoke with [place], pharmacist, RX for One touch ultra 2 has been received by the pharmacy.

## 2018-11-11 ENCOUNTER — Other Ambulatory Visit: Payer: Self-pay

## 2018-11-11 MED ORDER — GLUCOSE BLOOD VI STRP: 1.0000 | ORAL_STRIP | 0 refills | Status: DC | PRN

## 2018-11-11 MED ORDER — ONETOUCH ULTRA 2 W/DEVICE KIT: 1.0000 | PACK | 0 refills | Status: DC

## 2018-11-11 NOTE — Telephone Encounter (Signed)
E-scribed refill 

## 2018-11-23 ENCOUNTER — Other Ambulatory Visit: Payer: Self-pay | Admitting: Family Medicine

## 2018-12-08 ENCOUNTER — Ambulatory Visit: Payer: 59 | Admitting: Internal Medicine

## 2018-12-08 ENCOUNTER — Encounter: Payer: Self-pay | Admitting: Internal Medicine

## 2018-12-08 VITALS — BP 140/92 | HR 89 | Temp 98.0°F | Wt 297.0 lb

## 2018-12-08 DIAGNOSIS — S39012A Strain of muscle, fascia and tendon of lower back, initial encounter: Secondary | ICD-10-CM

## 2018-12-08 MED ORDER — PREDNISONE 10 MG PO TABS: ORAL_TABLET | ORAL | 0 refills | Status: DC

## 2018-12-08 MED ORDER — ORPHENADRINE CITRATE ER 100 MG PO TB12: 100.0000 mg | ORAL_TABLET | Freq: Two times a day (BID) | ORAL | 0 refills | Status: DC

## 2018-12-08 NOTE — Patient Instructions (Signed)

## 2018-12-10 NOTE — Progress Notes (Signed)
Subjective:    Patient ID: Jorge Boyle, male    DOB: 06-Dec-1975, 43 y.o.   MRN: [id number]  HPI  Patient presents to the clinic today with complaint of lower back pain.  He reports this started 3 days ago after moving a TV.  He describes the pain is sore and achy.  The pain radiates into both of his upper thighs.  He does have some associated numbness tingling or weakness.  He denies any issues with bowel or bladder.  He has taken ibuprofen and used a heating pad without any relief.  Review of Systems      Past Medical History:  Diagnosis Date  . Allergic rhinitis   . Diabetes mellitus without complication ([place])   . Difficult intubation   . Headache   . History of chicken pox   . History of kidney stones   . Multiple thyroid nodules 01/2012   left, biopsy x2 benign, rpt [place] stable (01/2013, 07/2015) rec rpt 1 yr  . Nontoxic nodular goiter   . Obesity   . Prediabetes   . S/P excision of thyroid adenoma 2005   R thyroidectomy    Current Outpatient Medications  Medication Sig Dispense Refill  . Blood Glucose Monitoring Suppl (ONE TOUCH ULTRA 2) w/Device KIT 1 kit by Does not apply route as directed. Use as directed to check blood sugar once daily.  Dx code:  E11.42. 1 each 0  . glucose blood (ONE TOUCH ULTRA TEST) test strip 1 each by Other route as needed for other. Use as instructed to check blood sugar once daily.  Dx code:  E11.42 100 each 0  . losartan (COZAAR) 100 MG tablet Take 1 tablet (100 mg total) by mouth daily. 90 tablet 1  . metFORMIN (GLUCOPHAGE) 1000 MG tablet TAKE 1 TABLET (1,000 MG TOTAL) BY MOUTH 2 (TWO) TIMES DAILY WITH A MEAL. 180 tablet 1  . ONETOUCH DELICA LANCETS 91M MISC 1 each by Does not apply route as directed. Use as directed to check blood sugars once daily.  Dx code:  E11.42 100 each 0  . orphenadrine (NORFLEX) 100 MG tablet Take 1 tablet (100 mg total) by mouth 2 (two) times daily. 20 tablet 0  . predniSONE (DELTASONE) 10 MG tablet Take 3 tabs on  days 1-3, take 2 tabs on days 4-6, take 1 tab on days 7-9 18 tablet 0   Current Facility-Administered Medications  Medication Dose Route Frequency Provider Last Rate Last Dose  . betamethasone acetate-betamethasone sodium phosphate (CELESTONE) injection 3 mg  3 mg Intramuscular Once Edrick Kins, DPM        Allergies  Allergen Reactions  . Penicillins Rash    Has patient had a PCN reaction causing immediate rash, facial/tongue/throat swelling, SOB or lightheadedness with hypotension: Yes Has patient had a PCN reaction causing severe rash involving mucus membranes or skin necrosis: No Has patient had a PCN reaction that required hospitalization: Unknown Has patient had a PCN reaction occurring within the last 10 years: No If all of the above answers are "NO", then may proceed with Cephalosporin use.   Marland Kitchen Lisinopril     Throat irritation, dry cough    Family History  Problem Relation Age of Onset  . Diabetes Paternal Grandmother   . Cancer Paternal Grandmother 41       breast  . Diabetes Mother   . Diabetes Father   . Stroke Maternal Grandmother   . Diabetes Maternal Grandfather   . Cancer  Other        stomach, lung (smoker)  . Coronary artery disease Neg Hx     Social History   Socioeconomic History  . Marital status: Single    Spouse name: Not on file  . Number of children: Not on file  . Years of education: Not on file  . Highest education level: Not on file  Occupational History  . Not on file  Social Needs  . Financial resource strain: Not on file  . Food insecurity:    Worry: Not on file    Inability: Not on file  . Transportation needs:    Medical: Not on file    Non-medical: Not on file  Tobacco Use  . Smoking status: Never Smoker  . Smokeless tobacco: Never Used  Substance and Sexual Activity  . Alcohol use: Yes    Alcohol/week: 0.0 standard drinks    Comment: rare  . Drug use: No  . Sexual activity: Not on file  Lifestyle  . Physical activity:     Days per week: Not on file    Minutes per session: Not on file  . Stress: Not on file  Relationships  . Social connections:    Talks on phone: Not on file    Gets together: Not on file    Attends religious service: Not on file    Active member of club or organization: Not on file    Attends meetings of clubs or organizations: Not on file    Relationship status: Not on file  . Intimate partner violence:    Fear of current or ex partner: Not on file    Emotionally abused: Not on file    Physically abused: Not on file    Forced sexual activity: Not on file  Other Topics Concern  . 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  Respiratory: Denies difficulty breathing, shortness of breath, cough or sputum production.   Cardiovascular: Denies chest pain, chest tightness, palpitations or swelling in the hands or feet.  Gastrointestinal: Denies abdominal pain, bloating, constipation, diarrhea or blood in the stool.  GU: Denies urgency, frequency, pain with urination, burning sensation, blood in urine, odor or discharge. Musculoskeletal: Patient reports low back pain.  Denies decrease in range of motion, difficulty with gait, or joint swelling.  Neurological: Patient reports numbness in bilateral legs.  Denies dizziness, difficulty with memory, difficulty with speech or problems with balance and coordination.    No other specific complaints in a complete review of systems (except as listed in HPI above).  Objective:   Physical Exam  BP (!) 140/92   Pulse 89   Temp 98 F (36.7 C) (Oral)   Wt 297 lb (134.7 kg)   SpO2 97%   BMI 42.62 kg/m  Wt Readings from Last 3 Encounters:    12/08/18 297 lb (134.7 kg)  10/30/18 290 lb 8 oz (131.8 kg)  09/07/18 288 lb 6.4 oz (130.8 kg)    General: Appears his stated age, obese, in NAD. Musculoskeletal: Decreased flexion and extension of the spine.  Normal rotation of the spine.  Bony tenderness noted over the lumbar spine.  Strength 5/5  BUE.  No difficulty with gait.  Neurological: Alert and oriented.  Negative SLR bilaterally.   BMET    Component Value Date/Time   NA 139 06/24/2018 0916   NA 138 12/23/2013 0852   K 4.1 06/24/2018 0916   K 3.7 12/23/2013 0852   K 4.0 02/17/2006   CL 103 06/24/2018 0916   CL 105 12/23/2013 0852   CL 106 02/17/2006   CO2 28 06/24/2018 0916   CO2 28 12/23/2013 0852   GLUCOSE 109 (H) 06/24/2018 0916   GLUCOSE 102 (H) 12/23/2013 0852   BUN 11 06/24/2018 0916   BUN 8 12/23/2013 0852   CREATININE 0.87 06/24/2018 0916   CREATININE 0.83 07/26/2016 1653   CALCIUM 9.7 06/24/2018 0916   CALCIUM 8.6 12/23/2013 0852   CALCIUM 9.5 02/17/2006   GFRNONAA >60 01/27/2018 1353   GFRNONAA >60 12/23/2013 0852   GFRAA >60 01/27/2018 1353   GFRAA >60 12/23/2013 0852    Lipid Panel     Component Value Date/Time   CHOL 152 06/24/2018 0916   CHOL 178 02/17/2006   TRIG 142.0 06/24/2018 0916   TRIG 119 05/21/2013   HDL 31.10 (L) 06/24/2018 0916   CHOLHDL 5 06/24/2018 0916   VLDL 28.4 06/24/2018 0916   LDLCALC 93 06/24/2018 0916    CBC    Component Value Date/Time   WBC 13.9 (H) 01/13/2018 1657   RBC 4.83 01/13/2018 1657   HGB 14.7 01/13/2018 1657   HGB 14.5 12/23/2013 0852   HCT 43.3 01/13/2018 1657   HCT 42.6 12/23/2013 0852   PLT 305 01/13/2018 1657   PLT 277 12/23/2013 0852   MCV 89.7 01/13/2018 1657   MCV 91 12/23/2013 0852   MCH 30.4 01/13/2018 1657   MCHC 33.9 01/13/2018 1657   RDW 14.5 01/13/2018 1657   RDW 13.6 12/23/2013 0852   LYMPHSABS 1.9 01/13/2018 1657   MONOABS 1.0 01/13/2018 1657   EOSABS 0.1 01/13/2018 1657   BASOSABS 0.1 01/13/2018 1657    Hgb A1C Lab  Results  Component Value Date   HGBA1C 7.2 (A) 10/30/2018            Assessment & Plan:   Lumbar Strain:  Rx for Pred taper x9 days-avoid OTC NSAID use Rx for Norflex 100 mg twice daily as needed-sedation caution given Heat and massage may be helpful Stretching exercises given No indication for x-ray at this time  Return precautions discussed Webb Silversmith, NP

## 2019-01-14 ENCOUNTER — Encounter: Payer: Self-pay | Admitting: Family Medicine

## 2019-01-14 DIAGNOSIS — M1A00X0 Idiopathic chronic gout, unspecified site, without tophus (tophi): Secondary | ICD-10-CM

## 2019-01-14 MED ORDER — COLCHICINE 0.6 MG PO TABS: 0.6000 mg | ORAL_TABLET | Freq: Every day | ORAL | 0 refills | Status: DC

## 2019-02-08 ENCOUNTER — Other Ambulatory Visit: Payer: Self-pay | Admitting: Family Medicine

## 2019-02-08 NOTE — Telephone Encounter (Signed)
[facility] Last filled:  01/14/19, #30 Last OV:  12/08/18, acute Next OV:  03/03/19, 4 mo f/u

## 2019-02-15 ENCOUNTER — Other Ambulatory Visit: Payer: Self-pay | Admitting: Family Medicine

## 2019-02-16 NOTE — Telephone Encounter (Addendum)
plz phone in due to E prescribing error.  

## 2019-02-16 NOTE — Telephone Encounter (Signed)
Refill left on vm at pharmacy.  

## 2019-02-26 ENCOUNTER — Other Ambulatory Visit: Payer: Self-pay | Admitting: Family Medicine

## 2019-03-01 ENCOUNTER — Other Ambulatory Visit: Payer: Self-pay | Admitting: Family Medicine

## 2019-03-01 ENCOUNTER — Other Ambulatory Visit (INDEPENDENT_AMBULATORY_CARE_PROVIDER_SITE_OTHER): Payer: 59

## 2019-03-01 DIAGNOSIS — M1A00X0 Idiopathic chronic gout, unspecified site, without tophus (tophi): Secondary | ICD-10-CM

## 2019-03-01 DIAGNOSIS — E785 Hyperlipidemia, unspecified: Secondary | ICD-10-CM

## 2019-03-01 DIAGNOSIS — E1142 Type 2 diabetes mellitus with diabetic polyneuropathy: Secondary | ICD-10-CM | POA: Diagnosis not present

## 2019-03-01 DIAGNOSIS — I10 Essential (primary) hypertension: Secondary | ICD-10-CM

## 2019-03-01 LAB — BASIC METABOLIC PANEL
BUN: 11 mg/dL (ref 6–23)
CO2: 28 mEq/L (ref 19–32)
Calcium: 8.6 mg/dL (ref 8.4–10.5)
Chloride: 103 mEq/L (ref 96–112)
Creatinine, Ser: 0.83 mg/dL (ref 0.40–1.50)
GFR: 100.92 mL/min (ref >60.00)
Glucose, Bld: 182 mg/dL — ABNORMAL HIGH (ref 70–99)
Potassium: 4 mEq/L (ref 3.5–5.1)
Sodium: 138 mEq/L (ref 135–145)

## 2019-03-01 LAB — HEMOGLOBIN A1C: Hgb A1c MFr Bld: 8.8 % — ABNORMAL HIGH (ref 4.6–6.5)

## 2019-03-01 LAB — URIC ACID: Uric Acid, Serum: 5.6 mg/dL (ref 4.0–7.8)

## 2019-03-01 NOTE — Addendum Note (Signed)
Addended by: Cloyd Stagers on: 03/01/2019 11:18 AM   Modules accepted: Orders

## 2019-03-02 NOTE — Addendum Note (Signed)
Addended by: Ellamae Sia on: 03/02/2019 04:03 PM   Modules accepted: Orders

## 2019-03-03 ENCOUNTER — Ambulatory Visit (INDEPENDENT_AMBULATORY_CARE_PROVIDER_SITE_OTHER): Payer: 59 | Admitting: Family Medicine

## 2019-03-03 ENCOUNTER — Encounter: Payer: Self-pay | Admitting: Family Medicine

## 2019-03-03 ENCOUNTER — Ambulatory Visit: Payer: 59 | Admitting: Family Medicine

## 2019-03-03 ENCOUNTER — Telehealth: Payer: Self-pay

## 2019-03-03 VITALS — BP 134/85 | HR 96 | Temp 97.5°F | Ht 70.0 in | Wt 296.2 lb

## 2019-03-03 DIAGNOSIS — E1165 Type 2 diabetes mellitus with hyperglycemia: Secondary | ICD-10-CM

## 2019-03-03 DIAGNOSIS — I10 Essential (primary) hypertension: Secondary | ICD-10-CM | POA: Diagnosis not present

## 2019-03-03 DIAGNOSIS — M5440 Lumbago with sciatica, unspecified side: Secondary | ICD-10-CM

## 2019-03-03 DIAGNOSIS — E1140 Type 2 diabetes mellitus with diabetic neuropathy, unspecified: Secondary | ICD-10-CM | POA: Diagnosis not present

## 2019-03-03 DIAGNOSIS — IMO0002 Reserved for concepts with insufficient information to code with codable children: Secondary | ICD-10-CM

## 2019-03-03 MED ORDER — ORPHENADRINE CITRATE ER 100 MG PO TB12: 100.0000 mg | ORAL_TABLET | Freq: Two times a day (BID) | ORAL | 0 refills | Status: DC

## 2019-03-03 MED ORDER — SITAGLIPTIN PHOSPHATE 50 MG PO TABS: 50.0000 mg | ORAL_TABLET | Freq: Every day | ORAL | 6 refills | Status: DC

## 2019-03-03 MED ORDER — NAPROXEN 500 MG PO TABS: ORAL_TABLET | ORAL | 0 refills | Status: DC

## 2019-03-03 NOTE — Assessment & Plan Note (Signed)
Chronic, stable. Continue current regimen. 

## 2019-03-03 NOTE — Assessment & Plan Note (Signed)
Acute flare after mowing parent's lawn. Will Rx naprosyn course and refill norflex. Update if not improving with treatment.

## 2019-03-03 NOTE — Progress Notes (Signed)
Virtual visit completed through [employer]. Due to national recommendations of social distancing due to [place] 19, a virtual visit is felt to be most appropriate for this patient at this time.   Patient location: home Provider location: [place] [facility], office If any vitals were documented, they were collected by patient at home unless specified below.    BP 134/85 (BP Location: Right Arm, Patient Position: Sitting)    Pulse 96    Temp (!) 97.5 F (36.4 C) (Oral)    Ht '5\' 10"'  (1.778 m)    Wt 296 lb 4 oz (134.4 kg)    BMI 42.51 kg/m    CC: 4 mo f/u visit Subjective:    Patient ID: Jorge Boyle, male    DOB: 11-06-1975, 43 y.o.   MRN: [id number]  HPI: Jorge Boyle is a 43 y.o. male presenting on 03/03/2019 for Diabetes (4 mo f/u.)   Cut parent's grass - over weekend back started hurting. Denies inciting trauma/injury. No leg weakness.   DM - does not regularly check sugars: elevated readings recently (140-200s fasting). Compliant with antihyperglycemic regimen which includes: metformin 1000 mg bid. Denies low sugars or hypoglycemic symptoms. Testing strips are very expensive. Denies paresthesias. Last diabetic eye exam 2018. Pneumovax: 2016. Prevnar: not due. Glucometer brand: one-touch. DSME: declines - no time. Lab Results  Component Value Date   HGBA1C 8.8 (H) 03/01/2019   Diabetic Foot Exam - Simple   No data filed     Lab Results  Component Value Date   MICROALBUR 0.8 10/28/2017     Recent tendonitis treated by podiatrist with steroid shot 12/2018.      Relevant past medical, surgical, family and social history reviewed and updated as indicated. Interim medical history since our last visit reviewed. Allergies and medications reviewed and updated. Outpatient Medications Prior to Visit  Medication Sig Dispense Refill   Blood Glucose Monitoring Suppl (ONE TOUCH ULTRA 2) w/Device KIT 1 kit by Does not apply route as directed. Use as directed to check blood  sugar once daily.  Dx code:  E11.42. 1 each 0   COLCRYS 0.6 MG tablet TAKE 1 TABLET (0.6 MG TOTAL) BY MOUTH DAILY. MAY TAKE 2 TABLETS ON THE FIRST DAY OF USE 90 tablet 0   losartan (COZAAR) 100 MG tablet Take 1 tablet (100 mg total) by mouth daily. 90 tablet 1   metFORMIN (GLUCOPHAGE) 1000 MG tablet TAKE 1 TABLET (1,000 MG TOTAL) BY MOUTH 2 (TWO) TIMES DAILY WITH A MEAL. 180 tablet 1   ONE TOUCH ULTRA TEST test strip USE AS DIRECTED TO CHECK  BLOOD SUGAR ONCE DAILY 100 each 1   ONETOUCH DELICA LANCETS 93O MISC 1 each by Does not apply route as directed. Use as directed to check blood sugars once daily.  Dx code:  E11.42 100 each 0   orphenadrine (NORFLEX) 100 MG tablet Take 1 tablet (100 mg total) by mouth 2 (two) times daily. 20 tablet 0   Facility-Administered Medications Prior to Visit  Medication Dose Route Frequency Provider Last Rate Last Dose   betamethasone acetate-betamethasone sodium phosphate (CELESTONE) injection 3 mg  3 mg Intramuscular Once Edrick Kins, DPM         Per HPI unless specifically indicated in ROS section below Review of Systems Objective:    BP 134/85 (BP Location: Right Arm, Patient Position: Sitting)    Pulse 96    Temp (!) 97.5 F (36.4 C) (Oral)    Ht '5\' 10"'  (  1.778 m)    Wt 296 lb 4 oz (134.4 kg)    BMI 42.51 kg/m   Wt Readings from Last 3 Encounters:  03/03/19 296 lb 4 oz (134.4 kg)  12/08/18 297 lb (134.7 kg)  10/30/18 290 lb 8 oz (131.8 kg)     Physical exam: Gen: alert, NAD, not ill appearing Pulm: speaks in complete sentences without increased work of breathing Psych: normal mood, normal thought content      Results for orders placed or performed in visit on 03/01/19  Uric acid  Result Value Ref Range   Uric Acid, Serum 5.6 4.0 - 7.8 mg/dL  Hemoglobin A1c  Result Value Ref Range   Hgb A1c MFr Bld 8.8 (H) 4.6 - 6.5 %  Basic metabolic panel  Result Value Ref Range   Sodium 138 135 - 145 mEq/L   Potassium 4.0 3.5 - 5.1 mEq/L    Chloride 103 96 - 112 mEq/L   CO2 28 19 - 32 mEq/L   Glucose, Bld 182 (H) 70 - 99 mg/dL   BUN 11 6 - 23 mg/dL   Creatinine, Ser 0.83 0.40 - 1.50 mg/dL   Calcium 8.6 8.4 - 10.5 mg/dL   GFR 100.92 >60.00 mL/min   Assessment & Plan:   Problem List Items Addressed This Visit    Type 2 diabetes mellitus, uncontrolled, with neuropathy ([place]) - Primary    Deteriorated control attributed to poor diet and more sedentary lifestyle (with closed gyms due to [place]). Encouraged renewed efforts towards diabetic diet. Encouraged start regular walking routine. Continue metformin, price out [place] sent to pharmacy. Pt desires to avoid injectable at this time. Reassess at CPE      Relevant Medications   sitaGLIPtin (JANUVIA) 50 MG tablet   Low back pain    Acute flare after mowing parent's lawn. Will Rx naprosyn course and refill norflex. Update if not improving with treatment.       Relevant Medications   orphenadrine (NORFLEX) 100 MG tablet   naproxen (NAPROSYN) 500 MG tablet   Essential hypertension    Chronic, stable. Continue current regimen.           Meds ordered this encounter  Medications   orphenadrine (NORFLEX) 100 MG tablet    Sig: Take 1 tablet (100 mg total) by mouth 2 (two) times daily.    Dispense:  30 tablet    Refill:  0   naproxen (NAPROSYN) 500 MG tablet    Sig: Take one po bid x 1 week then prn pain, take with food    Dispense:  40 tablet    Refill:  0   sitaGLIPtin (JANUVIA) 50 MG tablet    Sig: Take 1 tablet (50 mg total) by mouth daily.    Dispense:  30 tablet    Refill:  6    To price out, let me know if more affordable DPP4 inhibitor available   No orders of the defined types were placed in this encounter.   Follow up plan: Return in about 4 months (around 07/04/2019) for annual exam, prior fasting for blood work.  Ria Bush, MD

## 2019-03-03 NOTE — Assessment & Plan Note (Addendum)
Deteriorated control attributed to poor diet and more sedentary lifestyle (with closed gyms due to [place]). Encouraged renewed efforts towards diabetic diet. Encouraged start regular walking routine. Continue metformin, price out [place] sent to pharmacy. Pt desires to avoid injectable at this time. Reassess at CPE

## 2019-03-03 NOTE — Telephone Encounter (Signed)
Left message on vm per dpr asking pt to call back.    Needs lab visit to drop of urine sample.   [Dr. G added urine to labs for today's 4:00 virtual visit.]

## 2019-03-04 ENCOUNTER — Other Ambulatory Visit (INDEPENDENT_AMBULATORY_CARE_PROVIDER_SITE_OTHER): Payer: 59

## 2019-03-04 DIAGNOSIS — E1142 Type 2 diabetes mellitus with diabetic polyneuropathy: Secondary | ICD-10-CM

## 2019-03-04 DIAGNOSIS — M1A00X0 Idiopathic chronic gout, unspecified site, without tophus (tophi): Secondary | ICD-10-CM | POA: Diagnosis not present

## 2019-03-05 LAB — MICROALBUMIN / CREATININE URINE RATIO
Creatinine,U: 183.3 mg/dL
Microalb Creat Ratio: 0.5 mg/g (ref 0.0–30.0)
Microalb, Ur: 0.9 mg/dL (ref 0.0–1.9)

## 2019-03-08 MED ORDER — GLIMEPIRIDE 1 MG PO TABS: 1.0000 mg | ORAL_TABLET | Freq: Every day | ORAL | 6 refills | Status: DC

## 2019-03-12 ENCOUNTER — Other Ambulatory Visit: Payer: Self-pay | Admitting: Family Medicine

## 2019-05-06 ENCOUNTER — Other Ambulatory Visit: Payer: Self-pay

## 2019-05-06 MED ORDER — GLIMEPIRIDE 1 MG PO TABS: 1.0000 mg | ORAL_TABLET | Freq: Every day | ORAL | 0 refills | Status: DC

## 2019-05-06 NOTE — Telephone Encounter (Signed)
E-scribed refill 

## 2019-06-09 ENCOUNTER — Other Ambulatory Visit: Payer: Self-pay

## 2019-06-09 ENCOUNTER — Encounter: Payer: Self-pay | Admitting: Emergency Medicine

## 2019-06-09 ENCOUNTER — Emergency Department
Admission: EM | Admit: 2019-06-09 | Discharge: 2019-06-09 | Disposition: A | Discharge status: Home / Self Care | Payer: 59 | Attending: Emergency Medicine | Admitting: Emergency Medicine

## 2019-06-09 DIAGNOSIS — S39012A Strain of muscle, fascia and tendon of lower back, initial encounter: Secondary | ICD-10-CM | POA: Insufficient documentation

## 2019-06-09 DIAGNOSIS — M5431 Sciatica, right side: Secondary | ICD-10-CM | POA: Diagnosis not present

## 2019-06-09 DIAGNOSIS — Y929 Unspecified place or not applicable: Secondary | ICD-10-CM | POA: Insufficient documentation

## 2019-06-09 DIAGNOSIS — Y93K1 Activity, walking an animal: Secondary | ICD-10-CM | POA: Insufficient documentation

## 2019-06-09 DIAGNOSIS — Y999 Unspecified external cause status: Secondary | ICD-10-CM | POA: Insufficient documentation

## 2019-06-09 DIAGNOSIS — X58XXXA Exposure to other specified factors, initial encounter: Secondary | ICD-10-CM | POA: Insufficient documentation

## 2019-06-09 DIAGNOSIS — M5432 Sciatica, left side: Secondary | ICD-10-CM | POA: Diagnosis not present

## 2019-06-09 DIAGNOSIS — S3992XA Unspecified injury of lower back, initial encounter: Secondary | ICD-10-CM | POA: Diagnosis present

## 2019-06-09 MED ORDER — NAPROXEN 500 MG PO TBEC: 500.0000 mg | DELAYED_RELEASE_TABLET | Freq: Two times a day (BID) | ORAL | 0 refills | Status: AC

## 2019-06-09 MED ORDER — DEXAMETHASONE SODIUM PHOSPHATE 10 MG/ML IJ SOLN
10.0000 mg | Freq: Once | INTRAMUSCULAR | Status: AC
  Administered 2019-06-09: 10 mg via INTRAMUSCULAR
  Filled 2019-06-09: qty 1

## 2019-06-09 MED ORDER — KETOROLAC TROMETHAMINE 60 MG/2ML IM SOLN
60.0000 mg | Freq: Once | INTRAMUSCULAR | Status: AC
  Administered 2019-06-09: 60 mg via INTRAMUSCULAR
  Filled 2019-06-09: qty 2

## 2019-06-09 MED ORDER — OXYCODONE-ACETAMINOPHEN 5-325 MG PO TABS: 1.0000 | ORAL_TABLET | Freq: Four times a day (QID) | ORAL | 0 refills | Status: DC | PRN

## 2019-06-09 MED ORDER — METHOCARBAMOL 500 MG PO TABS: 500.0000 mg | ORAL_TABLET | Freq: Three times a day (TID) | ORAL | 0 refills | Status: DC | PRN

## 2019-06-09 MED ORDER — OXYCODONE-ACETAMINOPHEN 5-325 MG PO TABS
2.0000 | ORAL_TABLET | Freq: Once | ORAL | Status: AC
  Administered 2019-06-09: 2 via ORAL
  Filled 2019-06-09: qty 2

## 2019-06-09 NOTE — ED Triage Notes (Signed)
Pt arrived via POV with reports of low back pain that started a few days ago after walking the dog. PT states the dog jerked to go after a cat and caused the pain. PT states he has hx of back pain and states the dog jerking him exacerbated the pain.  Pt states sitting makes the pain better, but is worse when getting up and walking.

## 2019-06-09 NOTE — ED Notes (Signed)
ED Provider at bedside. 

## 2019-06-09 NOTE — ED Provider Notes (Signed)
[facility] Emergency Department Provider Note  ____________________________________________   First MD Initiated Contact with Patient 06/09/19 [id number]     (approximate)  I have reviewed the triage vital signs and the nursing notes.   HISTORY  Chief Complaint Back Pain    HPI Jorge Boyle is a 43 y.o. male  With PMHx DM, kidney stones, here with back pain. Pt states he has a long h/o recurrent lower back pain radiating down his legs. He has been diagnosed with sciatica. He states that several days ago, he was walking hsi dog when the dog "took off," jerking him to the side. Since then, he's had persistent, worsening, midline and paraspinal lower back pain. He has intermittent sharp, stabbing pain as well that radiates down his left leg (back of left leg). Pain is worse with any movement or palpation. No alleviating factors. No loss of bowel or bladder function. No persistent numbness or weakness. No other complaints. No fever, chills. No falls.        Past Medical History:  Diagnosis Date  . Allergic rhinitis   . Diabetes mellitus without complication ([place])   . Difficult intubation   . Headache   . History of chicken pox   . History of kidney stones   . Multiple thyroid nodules 01/2012   left, biopsy x2 benign, rpt [place] stable (01/2013, 07/2015) rec rpt 1 yr  . Nontoxic nodular goiter   . Obesity   . Prediabetes   . S/P excision of thyroid adenoma 2005   R thyroidectomy    Patient Active Problem List   Diagnosis Date Noted  . Essential hypertension 04/21/2018  . Calcium nephrolithiasis 02/07/2018  . Low back pain 02/05/2016  . Dyslipidemia 01/16/2016  . Gout 05/26/2015  . Health maintenance examination 01/04/2015  . Ventral hernia 11/22/2013  . Umbilical hernia 63/33/5456  . Type 2 diabetes mellitus, uncontrolled, with neuropathy ([place])   . Thyroid nodule 01/14/2012  . S/P excision of thyroid adenoma 01/01/2012  . Obesity, morbid, BMI  40.0-49.9 ([place]) 01/01/2012    Past Surgical History:  Procedure Laterality Date  . CYSTOSCOPY/URETEROSCOPY/HOLMIUM LASER/STENT PLACEMENT Left 02/02/2018   Procedure: CYSTOSCOPY/URETEROSCOPY/HOLMIUM LASER/STENT PLACEMENT;  Surgeon: Hollice Espy, MD;  Location: [facility] ORS;  Service: Urology;  Laterality: Left;  . THYROIDECTOMY, PARTIAL  2005   Right and isthmus. Dr Nadeen Landau  . TONSILLECTOMY AND ADENOIDECTOMY  1985  . UMBILICAL HERNIA REPAIR  12/30/2013   [place]  . VENTRAL HERNIA REPAIR N/A 02/02/2018   Procedure: LAPAROSCOPIC VENTRAL HERNIA;  Surgeon: Robert Bellow, MD;  Location: [facility] ORS;  Service: General;  Laterality: N/A;    Prior to Admission medications   Medication Sig Start Date End Date Taking? Authorizing Provider  Blood Glucose Monitoring Suppl (ONE TOUCH ULTRA 2) w/Device KIT 1 kit by Does not apply route as directed. Use as directed to check blood sugar once daily.  Dx code:  E11.42. 11/11/18   Ria Bush, MD  COLCRYS 0.6 MG tablet TAKE 1 TABLET (0.6 MG TOTAL) BY MOUTH DAILY. MAY TAKE 2 TABLETS ON THE FIRST DAY OF USE 02/16/19   Ria Bush, MD  glimepiride (AMARYL) 1 MG tablet Take 1 tablet (1 mg total) by mouth daily with breakfast. 05/06/19   Ria Bush, MD  losartan (COZAAR) 100 MG tablet TAKE 1 TABLET BY MOUTH  DAILY 03/16/19   Ria Bush, MD  metFORMIN (GLUCOPHAGE) 1000 MG tablet TAKE 1 TABLET BY MOUTH TWO  TIMES DAILY WITH A MEAL 03/16/19  Ria Bush, MD  methocarbamol (ROBAXIN) 500 MG tablet Take 1 tablet (500 mg total) by mouth every 8 (eight) hours as needed for muscle spasms. 06/09/19   Duffy Bruce, MD  naproxen (EC NAPROSYN) 500 MG EC tablet Take 1 tablet (500 mg total) by mouth 2 (two) times daily with a meal for 7 days. 06/09/19 06/16/19  Duffy Bruce, MD  ONE TOUCH ULTRA TEST test strip USE AS DIRECTED TO CHECK  BLOOD SUGAR ONCE DAILY 02/26/19   Ria Bush, MD  [facility] DELICA LANCETS 63O MISC 1 each by Does not  apply route as directed. Use as directed to check blood sugars once daily.  Dx code:  E11.42 11/05/18   Ria Bush, MD  orphenadrine (NORFLEX) 100 MG tablet Take 1 tablet (100 mg total) by mouth 2 (two) times daily. 03/03/19   Ria Bush, MD  oxyCODONE-acetaminophen (PERCOCET) 5-325 MG tablet Take 1-2 tablets by mouth every 6 (six) hours as needed for moderate pain or severe pain. 06/09/19 06/08/20  Duffy Bruce, MD  sitaGLIPtin (JANUVIA) 50 MG tablet Take 1 tablet (50 mg total) by mouth daily. 03/03/19   Ria Bush, MD    Allergies Penicillins and Lisinopril  Family History  Problem Relation Age of Onset  . Diabetes Paternal Grandmother   . Cancer Paternal Grandmother 38       breast  . Diabetes Mother   . Diabetes Father   . Stroke Maternal Grandmother   . Diabetes Maternal Grandfather   . Cancer Other        stomach, lung (smoker)  . Coronary artery disease Neg Hx     Social History Social History   Tobacco Use  . Smoking status: Never Smoker  . Smokeless tobacco: Never Used  Substance Use Topics  . Alcohol use: Yes    Alcohol/week: 0.0 standard drinks    Comment: rare  . Drug use: No    Review of Systems  Review of Systems  Constitutional: Negative for chills and fever.  HENT: Negative for sore throat.   Respiratory: Negative for shortness of breath.   Cardiovascular: Negative for chest pain.  Gastrointestinal: Negative for abdominal pain.  Genitourinary: Negative for flank pain.  Musculoskeletal: Positive for back pain and gait problem. Negative for neck pain.  Skin: Negative for rash and wound.  Allergic/Immunologic: Negative for immunocompromised state.  Neurological: Negative for weakness and numbness.  Hematological: Does not bruise/bleed easily.     ____________________________________________  PHYSICAL EXAM:      VITAL SIGNS: ED Triage Vitals  Enc Vitals Group     BP 06/09/19 0520 (!) 129/91     Pulse Rate 06/09/19 0520 96      Resp 06/09/19 0520 18     Temp 06/09/19 0520 98.3 F (36.8 C)     Temp Source 06/09/19 0520 Oral     SpO2 06/09/19 0520 97 %     Weight 06/09/19 0518 300 lb (136.1 kg)     Height 06/09/19 0518 '5\' 10"'  (1.778 m)     Head Circumference --      Peak Flow --      Pain Score 06/09/19 0518 7     Pain Loc --      Pain Edu? --      Excl. in [place]? --      Physical Exam Vitals signs and nursing note reviewed.  Constitutional:      General: He is not in acute distress.    Appearance: He is well-developed.  HENT:  Head: Normocephalic and atraumatic.  Eyes:     Conjunctiva/sclera: Conjunctivae normal.  Neck:     Musculoskeletal: Neck supple.  Cardiovascular:     Rate and Rhythm: Normal rate and regular rhythm.     Heart sounds: Normal heart sounds.  Pulmonary:     Effort: Pulmonary effort is normal. No respiratory distress.     Breath sounds: No wheezing.  Abdominal:     General: There is no distension.  Skin:    General: Skin is warm.     Capillary Refill: Capillary refill takes less than 2 seconds.     Findings: No rash.  Neurological:     Mental Status: He is alert and oriented to person, place, and time.     Motor: No abnormal muscle tone.      Spine Exam: Inspection/Palpation: Moderate R>L paraspinal TTP. No midline TTP. Strength: 5/5 throughout LE bilaterally (hip flexion/extension, adduction/abduction; knee flexion/extension; foot dorsiflexion/plantarflexion, inversion/eversion; great toe inversion) Sensation: Intact to light touch in proximal and distal LE bilaterally Reflexes: 2+ quadriceps and achilles reflexes   ____________________________________________   LABS (all labs ordered are listed, but only abnormal results are displayed)  Labs Reviewed - No data to display  ____________________________________________  EKG: None ________________________________________  RADIOLOGY All imaging, including plain films, CT scans, and ultrasounds, independently  reviewed by me, and interpretations confirmed via formal radiology reads.  ED MD interpretation:   None  Official radiology report(s): No results found.  ____________________________________________  PROCEDURES   Procedure(s) performed (including Critical Care):  Procedures  ____________________________________________  INITIAL IMPRESSION / MDM / [place] / ED COURSE  As part of my medical decision making, I reviewed the following data within the electronic MEDICAL RECORD NUMBER Notes from prior ED visits and [place] Controlled Substance Database      *Jorge Boyle was evaluated in Emergency Department on 06/09/2019 for the symptoms described in the history of present illness. He was evaluated in the context of the global COVID-19 pandemic, which necessitated consideration that the patient might be at risk for infection with the SARS-CoV-2 virus that causes COVID-19. Institutional protocols and algorithms that pertain to the evaluation of patients at risk for COVID-19 are in a state of rapid change based on information released by regulatory bodies including the CDC and federal and state organizations. These policies and algorithms were followed during the patient's care in the ED.  Some ED evaluations and interventions may be delayed as a result of limited staffing during the pandemic.*   Clinical Course as of Jun 08 625  Wed Jun 09, 2019  0609 43 yo M here with paraspinal>midline lumbar back pain after being jerked sideways walking a dog. Suspect paraspinal strain with acute on chronic sciatica. Minimal midlien TTP, no falls, do not suspect bony injury and plain films not indicated at this time. No LE weakness, numbness, loss of bowel or bladder, or signs of cauda equina. No red flags, no fever, no chills, no h/o IVDU or malignancy. Will treat supportively. Encouraged weight loss.   [CI]    Clinical Course User Index [CI] Duffy Bruce, MD    Medical Decision Making: As  above  ____________________________________________  FINAL CLINICAL IMPRESSION(S) / ED DIAGNOSES  Final diagnoses:  Strain of lumbar paraspinous muscle, initial encounter  Bilateral sciatica     MEDICATIONS GIVEN DURING THIS VISIT:  Medications  ketorolac (TORADOL) injection 60 mg (has no administration in time range)  dexamethasone (DECADRON) injection 10 mg (has no administration in time range)  oxyCODONE-acetaminophen (PERCOCET/ROXICET) 5-325 MG per tablet 2 tablet (has no administration in time range)     ED Discharge Orders         Ordered    naproxen (EC NAPROSYN) 500 MG EC tablet  2 times daily with meals     06/09/19 0624    oxyCODONE-acetaminophen (PERCOCET) 5-325 MG tablet  Every 6 hours PRN     06/09/19 0624    methocarbamol (ROBAXIN) 500 MG tablet  Every 8 hours PRN     06/09/19 2841           Note:  This document was prepared using Dragon voice recognition software and may include unintentional dictation errors.   Duffy Bruce, MD 06/09/19 [id number]

## 2019-06-29 ENCOUNTER — Other Ambulatory Visit: Payer: Self-pay | Admitting: Family Medicine

## 2019-07-05 ENCOUNTER — Other Ambulatory Visit (INDEPENDENT_AMBULATORY_CARE_PROVIDER_SITE_OTHER): Payer: 59

## 2019-07-05 ENCOUNTER — Encounter: Payer: Self-pay | Admitting: Family Medicine

## 2019-07-05 ENCOUNTER — Other Ambulatory Visit: Payer: Self-pay | Admitting: Family Medicine

## 2019-07-05 DIAGNOSIS — E1165 Type 2 diabetes mellitus with hyperglycemia: Secondary | ICD-10-CM

## 2019-07-05 DIAGNOSIS — M1A00X0 Idiopathic chronic gout, unspecified site, without tophus (tophi): Secondary | ICD-10-CM | POA: Diagnosis not present

## 2019-07-05 DIAGNOSIS — IMO0002 Reserved for concepts with insufficient information to code with codable children: Secondary | ICD-10-CM

## 2019-07-05 DIAGNOSIS — E785 Hyperlipidemia, unspecified: Secondary | ICD-10-CM

## 2019-07-05 DIAGNOSIS — E1140 Type 2 diabetes mellitus with diabetic neuropathy, unspecified: Secondary | ICD-10-CM

## 2019-07-05 DIAGNOSIS — E041 Nontoxic single thyroid nodule: Secondary | ICD-10-CM | POA: Diagnosis not present

## 2019-07-05 LAB — COMPREHENSIVE METABOLIC PANEL
ALT: 26 U/L (ref 0–53)
AST: 14 U/L (ref 0–37)
Albumin: 4 g/dL (ref 3.5–5.2)
Alkaline Phosphatase: 57 U/L (ref 39–117)
BUN: 11 mg/dL (ref 6–23)
CO2: 29 mEq/L (ref 19–32)
Calcium: 9.2 mg/dL (ref 8.4–10.5)
Chloride: 100 mEq/L (ref 96–112)
Creatinine, Ser: 0.82 mg/dL (ref 0.40–1.50)
GFR: 102.18 mL/min (ref >60.00)
Glucose, Bld: 117 mg/dL — ABNORMAL HIGH (ref 70–99)
Potassium: 3.8 mEq/L (ref 3.5–5.1)
Sodium: 139 mEq/L (ref 135–145)
Total Bilirubin: 0.4 mg/dL (ref 0.2–1.2)
Total Protein: 6.8 g/dL (ref 6.0–8.3)

## 2019-07-05 LAB — LIPID PANEL
Cholesterol: 161 mg/dL (ref 0–200)
HDL: 31.5 mg/dL — ABNORMAL LOW (ref >39.00)
LDL Cholesterol: 110 mg/dL — ABNORMAL HIGH (ref 0–99)
NonHDL: 129.07
Total CHOL/HDL Ratio: 5
Triglycerides: 97 mg/dL (ref 0.0–149.0)
VLDL: 19.4 mg/dL (ref 0.0–40.0)

## 2019-07-05 LAB — TSH: TSH: 2.04 u[IU]/mL (ref 0.35–4.50)

## 2019-07-05 LAB — URIC ACID: Uric Acid, Serum: 6.1 mg/dL (ref 4.0–7.8)

## 2019-07-05 LAB — HEMOGLOBIN A1C: Hgb A1c MFr Bld: 9 % — ABNORMAL HIGH (ref 4.6–6.5)

## 2019-07-08 MED ORDER — NAPROXEN 500 MG PO TABS: 500.0000 mg | ORAL_TABLET | Freq: Two times a day (BID) | ORAL | 1 refills | Status: DC | PRN

## 2019-07-09 ENCOUNTER — Encounter: Payer: Self-pay | Admitting: Family Medicine

## 2019-07-09 ENCOUNTER — Ambulatory Visit (INDEPENDENT_AMBULATORY_CARE_PROVIDER_SITE_OTHER): Payer: 59 | Admitting: Family Medicine

## 2019-07-09 ENCOUNTER — Other Ambulatory Visit: Payer: Self-pay

## 2019-07-09 VITALS — BP 122/80 | HR 83 | Temp 97.6°F | Ht 69.75 in | Wt 300.4 lb

## 2019-07-09 DIAGNOSIS — I10 Essential (primary) hypertension: Secondary | ICD-10-CM | POA: Diagnosis not present

## 2019-07-09 DIAGNOSIS — Z23 Encounter for immunization: Secondary | ICD-10-CM | POA: Diagnosis not present

## 2019-07-09 DIAGNOSIS — IMO0002 Reserved for concepts with insufficient information to code with codable children: Secondary | ICD-10-CM

## 2019-07-09 DIAGNOSIS — Z0000 Encounter for general adult medical examination without abnormal findings: Secondary | ICD-10-CM | POA: Diagnosis not present

## 2019-07-09 DIAGNOSIS — E1165 Type 2 diabetes mellitus with hyperglycemia: Secondary | ICD-10-CM

## 2019-07-09 DIAGNOSIS — E041 Nontoxic single thyroid nodule: Secondary | ICD-10-CM

## 2019-07-09 DIAGNOSIS — E785 Hyperlipidemia, unspecified: Secondary | ICD-10-CM

## 2019-07-09 DIAGNOSIS — E1140 Type 2 diabetes mellitus with diabetic neuropathy, unspecified: Secondary | ICD-10-CM

## 2019-07-09 DIAGNOSIS — M1A00X0 Idiopathic chronic gout, unspecified site, without tophus (tophi): Secondary | ICD-10-CM

## 2019-07-09 MED ORDER — GLIMEPIRIDE 2 MG PO TABS: 2.0000 mg | ORAL_TABLET | Freq: Every day | ORAL | 3 refills | Status: DC

## 2019-07-09 MED ORDER — ATORVASTATIN CALCIUM 20 MG PO TABS: 20.0000 mg | ORAL_TABLET | Freq: Every day | ORAL | 3 refills | Status: DC

## 2019-07-09 MED ORDER — METFORMIN HCL 1000 MG PO TABS: ORAL_TABLET | ORAL | 3 refills | Status: DC

## 2019-07-09 MED ORDER — LOSARTAN POTASSIUM 100 MG PO TABS: 100.0000 mg | ORAL_TABLET | Freq: Every day | ORAL | 3 refills | Status: DC

## 2019-07-09 NOTE — Assessment & Plan Note (Signed)
Deteriorated. Discussed weekly GLP1 RA. Declines injection medication. Will increase glimepiride to 2mg  daily with breakfast. RTC 3 mo DM f/u visit. Has upcoming eye doctor appt.

## 2019-07-09 NOTE — Assessment & Plan Note (Signed)
TSH stable.  

## 2019-07-09 NOTE — Assessment & Plan Note (Signed)
Chronic, LDL not at goal in diabetic. Discussed statin - will start low dose atorvastatin. The 10-year ASCVD risk score Mikey Bussing DC Brooke Bonito., et al., 2013) is: 4.1%   Values used to calculate the score:     Age: 43 years     Sex: Male     Is Non-Hispanic African American: No     Diabetic: Yes     Tobacco smoker: No     Systolic Blood Pressure: 123XX123 mmHg     Is BP treated: Yes     HDL Cholesterol: 31.5 mg/dL     Total Cholesterol: 161 mg/dL

## 2019-07-09 NOTE — Assessment & Plan Note (Signed)
Stable on PRN colchicine. No recent gout flares.

## 2019-07-09 NOTE — Progress Notes (Signed)
This visit was conducted in person.  BP 122/80 (BP Location: Left Arm, Patient Position: Sitting, Cuff Size: Large)   Pulse 83   Temp 97.6 F (36.4 C) (Temporal)   Ht 5' 9.75" (1.772 m)   Wt (!) 300 lb 7 oz (136.3 kg)   SpO2 98%   BMI 43.42 kg/m    CC: CPE Subjective:    Patient ID: Jorge Boyle, male    DOB: Jul 03, 1976, 43 y.o.   MRN: [id number]  HPI: Jorge Boyle is a 43 y.o. male presenting on 07/09/2019 for Annual Exam   Pulled back several weeks ago.  Works 3rd shift, 7d/wk.  He has found more affordable [employer] brand glucometer.   Preventative: Flu - yearly Tdap 12/2011  Pneumovax 2016  Seat belt use discussed.  Sunscreen use discussed. Denies changing moles on skin. 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 Interim medical history since our last visit reviewed. Allergies and medications reviewed and updated. Outpatient Medications Prior to Visit  Medication Sig Dispense Refill  . COLCRYS 0.6 MG tablet TAKE 1 TABLET (0.6 MG TOTAL) BY MOUTH DAILY. MAY TAKE 2 TABLETS ON THE FIRST DAY OF USE 90 tablet 0  . methocarbamol (ROBAXIN) 500 MG tablet Take 1 tablet (500 mg total) by mouth every 8 (eight) hours as needed for muscle spasms. 21 tablet 0  . naproxen (NAPROSYN) 500 MG tablet Take 1 tablet (500 mg total) by mouth 2 (two) times daily as needed for moderate pain. 60 tablet 1  . ONETOUCH DELICA LANCETS 63O MISC 1 each by Does not apply route as directed. Use as directed to check blood sugars once daily.  Dx code:  E11.42 100 each 0  . oxyCODONE-acetaminophen (PERCOCET) 5-325 MG  tablet Take 1-2 tablets by mouth every 6 (six) hours as needed for moderate pain or severe pain. 12 tablet 0  . glimepiride (AMARYL) 1 MG tablet TAKE 1 TABLET BY MOUTH  DAILY WITH BREAKFAST 90 tablet 3  . losartan (COZAAR) 100 MG tablet TAKE 1 TABLET BY MOUTH  DAILY 90 tablet 1  . metFORMIN (GLUCOPHAGE) 1000 MG tablet TAKE 1 TABLET BY MOUTH TWO  TIMES DAILY WITH A MEAL 180 tablet 1  . Blood Glucose Monitoring Suppl (ONE TOUCH ULTRA 2) w/Device KIT 1 kit by Does not apply route as directed. Use as directed to check blood sugar once daily.  Dx code:  E11.42. 1 each 0  . ONE TOUCH ULTRA TEST test strip USE AS DIRECTED TO CHECK  BLOOD SUGAR ONCE DAILY 100 each 1  . orphenadrine (NORFLEX) 100 MG tablet Take 1 tablet (100 mg total) by mouth 2 (two) times daily. 30 tablet 0  . sitaGLIPtin (JANUVIA) 50 MG tablet Take 1 tablet (50 mg total) by mouth daily. 30 tablet 6   Facility-Administered Medications Prior to Visit  Medication Dose Route Frequency Provider Last Rate Last Dose  . betamethasone acetate-betamethasone sodium phosphate (CELESTONE) injection 3 mg  3 mg Intramuscular Once Edrick Kins, DPM         Per HPI unless specifically indicated in ROS section below Review of Systems  Constitutional:  Negative for activity change, appetite change, chills, fatigue, fever and unexpected weight change.  HENT: Negative for hearing loss.   Eyes: Negative for visual disturbance.  Respiratory: Negative for cough, chest tightness, shortness of breath and wheezing.   Cardiovascular: Negative for chest pain, palpitations and leg swelling.  Gastrointestinal: Negative for abdominal distention, abdominal pain, blood in stool, constipation, diarrhea, nausea and vomiting.  Genitourinary: Negative for difficulty urinating and hematuria.  Musculoskeletal: Negative for arthralgias, myalgias and neck pain.  Skin: Negative for rash.  Neurological: Positive for headaches (due to broken tooth). Negative for dizziness,  seizures and syncope.  Hematological: Negative for adenopathy. Does not bruise/bleed easily.  Psychiatric/Behavioral: Negative for dysphoric mood. The patient is not nervous/anxious.    Objective:    BP 122/80 (BP Location: Left Arm, Patient Position: Sitting, Cuff Size: Large)   Pulse 83   Temp 97.6 F (36.4 C) (Temporal)   Ht 5' 9.75" (1.772 m)   Wt (!) 300 lb 7 oz (136.3 kg)   SpO2 98%   BMI 43.42 kg/m   Wt Readings from Last 3 Encounters:  07/09/19 (!) 300 lb 7 oz (136.3 kg)  06/09/19 300 lb (136.1 kg)  03/03/19 296 lb 4 oz (134.4 kg)    Physical Exam Vitals signs and nursing note reviewed.  Constitutional:      General: He is not in acute distress.    Appearance: Normal appearance. He is well-developed. He is obese. He is not ill-appearing.  HENT:     Head: Normocephalic and atraumatic.     Right Ear: Hearing, tympanic membrane, ear canal and external ear normal.     Left Ear: Hearing, tympanic membrane, ear canal and external ear normal.     Nose: Nose normal.     Mouth/Throat:     Mouth: Mucous membranes are moist.     Pharynx: Uvula midline. No oropharyngeal exudate or posterior oropharyngeal erythema.  Eyes:     General: No scleral icterus.    Extraocular Movements: Extraocular movements intact.     Conjunctiva/sclera: Conjunctivae normal.     Pupils: Pupils are equal, round, and reactive to light.  Neck:     Musculoskeletal: Normal range of motion and neck supple.  Cardiovascular:     Rate and Rhythm: Normal rate and regular rhythm.     Pulses: Normal pulses.          Radial pulses are 2+ on the right side and 2+ on the left side.     Heart sounds: Normal heart sounds. No murmur.  Pulmonary:     Effort: Pulmonary effort is normal. No respiratory distress.     Breath sounds: Normal breath sounds. No wheezing, rhonchi or rales.  Abdominal:     General: Abdomen is flat. Bowel sounds are normal. There is no distension.     Palpations: Abdomen is soft. There is  no mass.     Tenderness: There is no abdominal tenderness. There is no guarding or rebound.     Hernia: No hernia is present.  Musculoskeletal: Normal range of motion.     Right lower leg: No edema.     Left lower leg: No edema.  Lymphadenopathy:     Cervical: No cervical adenopathy.  Skin:    General: Skin is warm and dry.     Findings: No rash.  Neurological:     General: No focal deficit present.     Mental Status: He is alert and oriented to person, place, and time.  Comments: CN grossly intact, station and gait intact  Psychiatric:        Mood and Affect: Mood normal.        Behavior: Behavior normal.        Thought Content: Thought content normal.        Judgment: Judgment normal.       Results for orders placed or performed in visit on 07/05/19  Uric acid  Result Value Ref Range   Uric Acid, Serum 6.1 4.0 - 7.8 mg/dL  Hemoglobin A1c  Result Value Ref Range   Hgb A1c MFr Bld 9.0 (H) 4.6 - 6.5 %  TSH  Result Value Ref Range   TSH 2.04 0.35 - 4.50 uIU/mL  Comprehensive metabolic panel  Result Value Ref Range   Sodium 139 135 - 145 mEq/L   Potassium 3.8 3.5 - 5.1 mEq/L   Chloride 100 96 - 112 mEq/L   CO2 29 19 - 32 mEq/L   Glucose, Bld 117 (H) 70 - 99 mg/dL   BUN 11 6 - 23 mg/dL   Creatinine, Ser 0.82 0.40 - 1.50 mg/dL   Total Bilirubin 0.4 0.2 - 1.2 mg/dL   Alkaline Phosphatase 57 39 - 117 U/L   AST 14 0 - 37 U/L   ALT 26 0 - 53 U/L   Total Protein 6.8 6.0 - 8.3 g/dL   Albumin 4.0 3.5 - 5.2 g/dL   Calcium 9.2 8.4 - 10.5 mg/dL   GFR 102.18 >60.00 mL/min  Lipid panel  Result Value Ref Range   Cholesterol 161 0 - 200 mg/dL   Triglycerides 97.0 0.0 - 149.0 mg/dL   HDL 31.50 (L) >39.00 mg/dL   VLDL 19.4 0.0 - 40.0 mg/dL   LDL Cholesterol 110 (H) 0 - 99 mg/dL   Total CHOL/HDL Ratio 5    NonHDL 129.07    Assessment & Plan:   Problem List Items Addressed This Visit    Type 2 diabetes mellitus, uncontrolled, with neuropathy (HCC)    Deteriorated.  Discussed weekly GLP1 RA. Declines injection medication. Will increase glimepiride to 59m daily with breakfast. RTC 3 mo DM f/u visit. Has upcoming eye doctor appt.       Relevant Medications   glimepiride (AMARYL) 2 MG tablet   losartan (COZAAR) 100 MG tablet   metFORMIN (GLUCOPHAGE) 1000 MG tablet   atorvastatin (LIPITOR) 20 MG tablet   Thyroid nodule    TSH stable.       Obesity, morbid, BMI 40.0-49.9 (HCC)    Encourage healthy diet and lifestyle changes to affect sustainable weight loss. He is motivated to restart regular exercise routine at gym.       Relevant Medications   glimepiride (AMARYL) 2 MG tablet   metFORMIN (GLUCOPHAGE) 1000 MG tablet   Health maintenance examination - Primary    Preventative protocols reviewed and updated unless pt declined. Discussed healthy diet and lifestyle.       Gout    Stable on PRN colchicine. No recent gout flares.       Essential hypertension    Chronic, stable. Continue current regimen.       Relevant Medications   losartan (COZAAR) 100 MG tablet   atorvastatin (LIPITOR) 20 MG tablet   Dyslipidemia    Chronic, LDL not at goal in diabetic. Discussed statin - will start low dose atorvastatin. The 10-year ASCVD risk score (Mikey BussingDC Jr., et al., 2013) is: 4.1%   Values used to calculate the score:     Age:  57 years     Sex: Male     Is Non-Hispanic African American: No     Diabetic: Yes     Tobacco smoker: No     Systolic Blood Pressure: 528 mmHg     Is BP treated: Yes     HDL Cholesterol: 31.5 mg/dL     Total Cholesterol: 161 mg/dL       Relevant Medications   atorvastatin (LIPITOR) 20 MG tablet    Other Visit Diagnoses    Need for influenza vaccination       Relevant Orders   Flu Vaccine QUAD 36+ mos IM (Completed)       Meds ordered this encounter  Medications  . glimepiride (AMARYL) 2 MG tablet    Sig: Take 1 tablet (2 mg total) by mouth daily with breakfast.    Dispense:  90 tablet    Refill:  3    Note new  dose  . losartan (COZAAR) 100 MG tablet    Sig: Take 1 tablet (100 mg total) by mouth daily.    Dispense:  90 tablet    Refill:  3  . metFORMIN (GLUCOPHAGE) 1000 MG tablet    Sig: TAKE 1 TABLET BY MOUTH TWO  TIMES DAILY WITH A MEAL    Dispense:  180 tablet    Refill:  3  . atorvastatin (LIPITOR) 20 MG tablet    Sig: Take 1 tablet (20 mg total) by mouth daily.    Dispense:  90 tablet    Refill:  3   Orders Placed This Encounter  Procedures  . Flu Vaccine QUAD 36+ mos IM    Follow up plan: Return in about 3 months (around 10/08/2019) for follow up visit.  Ria Bush, MD

## 2019-07-09 NOTE — Assessment & Plan Note (Signed)
Chronic, stable. Continue current regimen. 

## 2019-07-09 NOTE — Patient Instructions (Addendum)
Flu shot today Increase glimepiride to 2mg  daily - new dose sent to pharmacy Start atorvastatin (lipitor) 20mg  daily.  Work on increased regular exercise.  Return in 3 months for diabetes follow up visit.   Health Maintenance, Male Adopting a healthy lifestyle and getting preventive care are important in promoting health and wellness. Ask your health care provider about:  The right schedule for you to have regular tests and exams.  Things you can do on your own to prevent diseases and keep yourself healthy. What should I know about diet, weight, and exercise? Eat a healthy diet   Eat a diet that includes plenty of vegetables, fruits, low-fat dairy products, and lean protein.  Do not eat a lot of foods that are high in solid fats, added sugars, or sodium. Maintain a healthy weight Body mass index (BMI) is a measurement that can be used to identify possible weight problems. It estimates body fat based on height and weight. Your health care provider can help determine your BMI and help you achieve or maintain a healthy weight. Get regular exercise Get regular exercise. This is one of the most important things you can do for your health. Most adults should:  Exercise for at least 150 minutes each week. The exercise should increase your heart rate and make you sweat (moderate-intensity exercise).  Do strengthening exercises at least twice a week. This is in addition to the moderate-intensity exercise.  Spend less time sitting. Even light physical activity can be beneficial. Watch cholesterol and blood lipids Have your blood tested for lipids and cholesterol at 44 years of age, then have this test every 5 years. You may need to have your cholesterol levels checked more often if:  Your lipid or cholesterol levels are high.  You are older than 44 years of age.  You are at high risk for heart disease. What should I know about cancer screening? Many types of cancers can be detected early  and may often be prevented. Depending on your health history and family history, you may need to have cancer screening at various ages. This may include screening for:  Colorectal cancer.  Prostate cancer.  Skin cancer.  Lung cancer. What should I know about heart disease, diabetes, and high blood pressure? Blood pressure and heart disease  High blood pressure causes heart disease and increases the risk of stroke. This is more likely to develop in people who have high blood pressure readings, are of African descent, or are overweight.  Talk with your health care provider about your target blood pressure readings.  Have your blood pressure checked: ? Every 3-5 years if you are 52-66 years of age. ? Every year if you are 61 years old or older.  If you are between the ages of 90 and 77 and are a current or former smoker, ask your health care provider if you should have a one-time screening for abdominal aortic aneurysm (AAA). Diabetes Have regular diabetes screenings. This checks your fasting blood sugar level. Have the screening done:  Once every three years after age 38 if you are at a normal weight and have a low risk for diabetes.  More often and at a younger age if you are overweight or have a high risk for diabetes. What should I know about preventing infection? Hepatitis B If you have a higher risk for hepatitis B, you should be screened for this virus. Talk with your health care provider to find out if you are at risk for  hepatitis B infection. Hepatitis C Blood testing is recommended for:  Everyone born from [birth year] through 1966.  Anyone with known risk factors for hepatitis C. Sexually transmitted infections (STIs)  You should be screened each year for STIs, including gonorrhea and chlamydia, if: ? You are sexually active and are younger than 44 years of age. ? You are older than 44 years of age and your health care provider tells you that you are at risk for this type of  infection. ? Your sexual activity has changed since you were last screened, and you are at increased risk for chlamydia or gonorrhea. Ask your health care provider if you are at risk.  Ask your health care provider about whether you are at high risk for HIV. Your health care provider may recommend a prescription medicine to help prevent HIV infection. If you choose to take medicine to prevent HIV, you should first get tested for HIV. You should then be tested every 3 months for as long as you are taking the medicine. Follow these instructions at home: Lifestyle  Do not use any products that contain nicotine or tobacco, such as cigarettes, e-cigarettes, and chewing tobacco. If you need help quitting, ask your health care provider.  Do not use street drugs.  Do not share needles.  Ask your health care provider for help if you need support or information about quitting drugs. Alcohol use  Do not drink alcohol if your health care provider tells you not to drink.  If you drink alcohol: ? Limit how much you have to 0-2 drinks a day. ? Be aware of how much alcohol is in your drink. In the [place] one drink equals one 12 oz bottle of beer (355 mL), one 5 oz glass of wine (148 mL), or one 1 oz glass of hard liquor (44 mL). General instructions  Schedule regular health, dental, and eye exams.  Stay current with your vaccines.  Tell your health care provider if: ? You often feel depressed. ? You have ever been abused or do not feel safe at home. Summary  Adopting a healthy lifestyle and getting preventive care are important in promoting health and wellness.  Follow your health care provider's instructions about healthy diet, exercising, and getting tested or screened for diseases.  Follow your health care provider's instructions on monitoring your cholesterol and blood pressure. This information is not intended to replace advice given to you by your health care provider. Make sure you  discuss any questions you have with your health care provider. Document Released: 04/04/2008 Document Revised: 09/30/2018 Document Reviewed: 09/30/2018 Elsevier Patient Education  2020 [employer].

## 2019-07-09 NOTE — Assessment & Plan Note (Signed)
Preventative protocols reviewed and updated unless pt declined. Discussed healthy diet and lifestyle.  

## 2019-07-09 NOTE — Assessment & Plan Note (Signed)
Encourage healthy diet and lifestyle changes to affect sustainable weight loss. He is motivated to restart regular exercise routine at gym.

## 2019-07-12 ENCOUNTER — Other Ambulatory Visit: Payer: 59

## 2019-08-02 ENCOUNTER — Encounter: Payer: Self-pay | Admitting: Emergency Medicine

## 2019-08-02 ENCOUNTER — Other Ambulatory Visit: Payer: Self-pay

## 2019-08-02 ENCOUNTER — Emergency Department
Admission: EM | Admit: 2019-08-02 | Discharge: 2019-08-02 | Disposition: A | Discharge status: Home / Self Care | Payer: 59 | Attending: Student in an Organized Health Care Education/Training Program | Admitting: Student in an Organized Health Care Education/Training Program

## 2019-08-02 DIAGNOSIS — D34 Benign neoplasm of thyroid gland: Secondary | ICD-10-CM | POA: Insufficient documentation

## 2019-08-02 DIAGNOSIS — I10 Essential (primary) hypertension: Secondary | ICD-10-CM | POA: Insufficient documentation

## 2019-08-02 DIAGNOSIS — M5431 Sciatica, right side: Secondary | ICD-10-CM

## 2019-08-02 DIAGNOSIS — Z79899 Other long term (current) drug therapy: Secondary | ICD-10-CM | POA: Diagnosis not present

## 2019-08-02 DIAGNOSIS — M545 Low back pain: Secondary | ICD-10-CM | POA: Diagnosis present

## 2019-08-02 DIAGNOSIS — E119 Type 2 diabetes mellitus without complications: Secondary | ICD-10-CM | POA: Insufficient documentation

## 2019-08-02 DIAGNOSIS — Z7984 Long term (current) use of oral hypoglycemic drugs: Secondary | ICD-10-CM | POA: Diagnosis not present

## 2019-08-02 DIAGNOSIS — M5432 Sciatica, left side: Secondary | ICD-10-CM | POA: Diagnosis not present

## 2019-08-02 MED ORDER — DEXAMETHASONE SODIUM PHOSPHATE 10 MG/ML IJ SOLN
10.0000 mg | Freq: Once | INTRAMUSCULAR | Status: AC
  Administered 2019-08-02: 20:00:00 10 mg via INTRAMUSCULAR
  Filled 2019-08-02: qty 1

## 2019-08-02 NOTE — ED Notes (Signed)
See triage note presents with lower back pain which is moving into both legs   Hx of same in past   Denies any recent injury

## 2019-08-02 NOTE — ED Provider Notes (Signed)
[facility] Emergency Department Provider Note ____________________________________________  Time seen: Approximately 7:44 PM  I have reviewed the triage vital signs and the nursing notes.  HISTORY  Chief Complaint Back Pain   HPI Jorge Boyle is a 43 y.o. male presenting to the emergency department for treatment of acute on chronic low back pain that radiates into both legs.  Symptoms started a couple days ago.  No relief with muscle relaxers and Naprosyn.  Patient states that when he had this a couple of months ago, he was evaluated here and given 2 injections that provided significant relief.  No new injury.  Past Medical History:  Diagnosis Date  . Allergic rhinitis   . Diabetes mellitus without complication ([place])   . Difficult intubation   . Headache   . History of chicken pox   . History of kidney stones   . Multiple thyroid nodules 01/2012   left, biopsy x2 benign, rpt [place] stable (01/2013, 07/2015) rec rpt 1 yr  . Nontoxic nodular goiter   . Obesity   . Prediabetes   . S/P excision of thyroid adenoma 2005   R thyroidectomy    Patient Active Problem List   Diagnosis Date Noted  . Essential hypertension 04/21/2018  . Calcium nephrolithiasis 02/07/2018  . Low back pain 02/05/2016  . Dyslipidemia 01/16/2016  . Gout 05/26/2015  . Health maintenance examination 01/04/2015  . Ventral hernia 11/22/2013  . Umbilical hernia [id number]  . Type 2 diabetes mellitus, uncontrolled, with neuropathy ([place])   . Thyroid nodule 01/14/2012  . S/P excision of thyroid adenoma 01/01/2012  . Obesity, morbid, BMI 40.0-49.9 ([place]) 01/01/2012    Past Surgical History:  Procedure Laterality Date  . CYSTOSCOPY/URETEROSCOPY/HOLMIUM LASER/STENT PLACEMENT Left 02/02/2018   Procedure: CYSTOSCOPY/URETEROSCOPY/HOLMIUM LASER/STENT PLACEMENT;  Surgeon: Hollice Espy, MD;  Location: [facility] ORS;  Service: Urology;  Laterality: Left;  . THYROIDECTOMY, PARTIAL  2005   Right  and isthmus. Dr Nadeen Landau  . TONSILLECTOMY AND ADENOIDECTOMY  1985  . UMBILICAL HERNIA REPAIR  12/30/2013   [place]  . VENTRAL HERNIA REPAIR N/A 02/02/2018   Procedure: LAPAROSCOPIC VENTRAL HERNIA;  Surgeon: Robert Bellow, MD;  Location: [facility] ORS;  Service: General;  Laterality: N/A;    Prior to Admission medications   Medication Sig Start Date End Date Taking? Authorizing Provider  atorvastatin (LIPITOR) 20 MG tablet Take 1 tablet (20 mg total) by mouth daily. 07/09/19   Ria Bush, MD  COLCRYS 0.6 MG tablet TAKE 1 TABLET (0.6 MG TOTAL) BY MOUTH DAILY. MAY TAKE 2 TABLETS ON THE FIRST DAY OF USE 02/16/19   Ria Bush, MD  glimepiride (AMARYL) 2 MG tablet Take 1 tablet (2 mg total) by mouth daily with breakfast. 07/09/19   Ria Bush, MD  losartan (COZAAR) 100 MG tablet Take 1 tablet (100 mg total) by mouth daily. 07/09/19   Ria Bush, MD  metFORMIN (GLUCOPHAGE) 1000 MG tablet TAKE 1 TABLET BY MOUTH TWO  TIMES DAILY WITH A MEAL 07/09/19   Ria Bush, MD  methocarbamol (ROBAXIN) 500 MG tablet Take 1 tablet (500 mg total) by mouth every 8 (eight) hours as needed for muscle spasms. 06/09/19   Duffy Bruce, MD  naproxen (NAPROSYN) 500 MG tablet Take 1 tablet (500 mg total) by mouth 2 (two) times daily as needed for moderate pain. 07/08/19   Ria Bush, MD  [facility] DELICA LANCETS [id number] MISC 1 each by Does not apply route as directed. Use as directed to check blood sugars once daily.  Dx code:  E11.42 11/05/18   Ria Bush, MD    Allergies Penicillins and Lisinopril  Family History  Problem Relation Age of Onset  . Diabetes Paternal Grandmother   . Cancer Paternal Grandmother 71       breast  . Diabetes Mother   . Diabetes Father   . Stroke Maternal Grandmother   . Diabetes Maternal Grandfather   . Cancer Other        stomach, lung (smoker)  . Coronary artery disease Neg Hx     Social History Social History   Tobacco Use  . Smoking  status: Never Smoker  . Smokeless tobacco: Never Used  Substance Use Topics  . Alcohol use: Yes    Alcohol/week: 0.0 standard drinks    Comment: rare  . Drug use: No    Review of Systems Constitutional: Well appearing. Respiratory: Negative for dyspnea. Cardiovascular: Negative for change in skin temperature or color. Musculoskeletal:   Negative for chronic steroid use   Negative for trauma in the presence of osteoporosis  Negative for age over 68 and trauma.  Negative for constitutional symptoms, or history of cancer   Negative for pain worse at night. Skin: Negative for rash, lesion, or wound.  Genitourinary: Negative for urinary retention. Rectal: Negative for fecal incontinence or new onset constipation/bowel habit changes. Hematological/Immunilogical: Negative for immunosuppression, IV drug use, or fever Neurological: Positive for burning, tingling, numb, electric, radiating pain in the bilateral lower extremities.                        Negative for saddle anesthesia.                        Negative for focal neurologic deficit, progressive or disabling symptoms             Negative for saddle anesthesia. ____________________________________________   PHYSICAL EXAM:  VITAL SIGNS: ED Triage Vitals  Enc Vitals Group     BP 08/02/19 1804 139/89     Pulse Rate 08/02/19 1804 92     Resp 08/02/19 1804 20     Temp 08/02/19 1804 98.4 F (36.9 C)     Temp Source 08/02/19 1804 Oral     SpO2 08/02/19 1804 98 %     Weight 08/02/19 1804 300 lb (136.1 kg)     Height 08/02/19 1804 5\' 11"  (1.803 m)     Head Circumference --      Peak Flow --      Pain Score 08/02/19 1807 3     Pain Loc --      Pain Edu? --      Excl. in [place]? --     Constitutional: Alert and oriented. Well appearing and in no acute distress. Eyes: Conjunctivae are clear without discharge or drainage.  Head: Atraumatic. Neck: Full, active range of motion. Respiratory: Respirations even and  unlabored. Musculoskeletal: Limited ROM of the back and extremities secondary to pain, Strength 5/5 of the lower extremities as tested. Neurologic: Reflexes of the lower extremities are 2+.  Positive straight leg raise on the right and left side. Skin: Atraumatic.  Psychiatric: Behavior and affect are normal.  ____________________________________________   LABS (all labs ordered are listed, but only abnormal results are displayed)  Labs Reviewed - No data to display ____________________________________________  RADIOLOGY  Not indicated ____________________________________________   PROCEDURES  Procedure(s) performed:  Procedures ____________________________________________   INITIAL IMPRESSION / ASSESSMENT AND PLAN /  ED COURSE  Jorge Boyle is a 43 y.o. male presenting to the emergency department for acute on chronic back pain without recent injury.  Injection of Decadron to be administered and then patient will be discharged home.  He was advised to continue his Naprosyn and muscle relaxer.  He is to call and schedule an appointment with orthopedics if not improving over the next few days.  If he is unable to schedule an appointment and his symptoms worsen, he is to see primary care or return to the emergency department.  Work note provided for today.  Medications  dexamethasone (DECADRON) injection 10 mg (has no administration in time range)    ED Discharge Orders    None       Pertinent labs & imaging results that were available during my care of the patient were reviewed by me and considered in my medical decision making (see chart for details).  _________________________________________   FINAL CLINICAL IMPRESSION(S) / ED DIAGNOSES  Final diagnoses:  Bilateral sciatica     If controlled substance prescribed during this visit, 12 month history viewed on the [place] prior to issuing an initial prescription for Schedule II or III opiod.   Victorino Dike, FNP 08/02/19 1947    Merlyn Lot, MD 08/02/19 2045

## 2019-08-02 NOTE — ED Triage Notes (Signed)
Pt arrives with complaints of back pain; pain is worse with movement. Pt reports similar episode before and states "they had to give me two shots in my arms and it helped it."

## 2019-08-02 NOTE — Discharge Instructions (Signed)
Please follow-up with orthopedics for symptoms that are not improving with medication.  Return to the emergency department for symptoms that change or worsen if you are unable to schedule an appointment with your primary care provider or the orthopedist.

## 2019-08-07 ENCOUNTER — Encounter: Payer: Self-pay | Admitting: Family Medicine

## 2019-09-09 IMAGING — CT CT ABD-PELV W/ CM
2 of 5 series · 15 of 46 positions shown, 17 images · IV contrast (iopamidol)
Comparison: Lumbar radiographs 12/09/2016.

CLINICAL DATA: 41-year-old male with ventral hernia and increasing
abdominal pain in the past 48 hours.

EXAM:
CT ABDOMEN AND PELVIS WITH CONTRAST
TECHNIQUE: Multidetector CT imaging of the abdomen and pelvis was performed
using the standard protocol following bolus administration of
intravenous contrast.
CONTRAST:  100mL XCNLLN-0BB IOPAMIDOL (XCNLLN-0BB) INJECTION 61%

[Series 5: axial st1 · axial · 0.87mm/px · z∈[-1132,-592]mm · 12 of 122 slices shown, 14 images]
[im 7/122  soft-tissue]
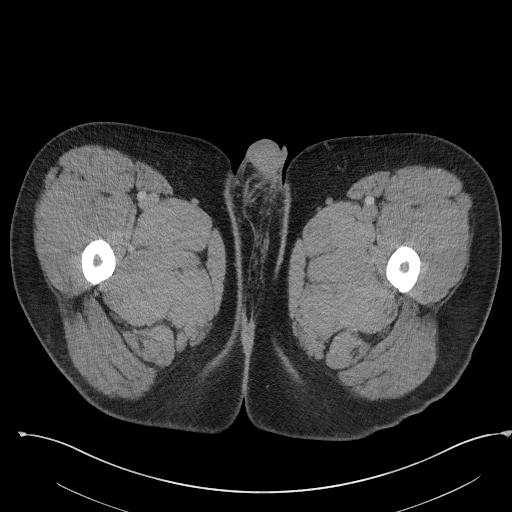
[im 7/122  bone]
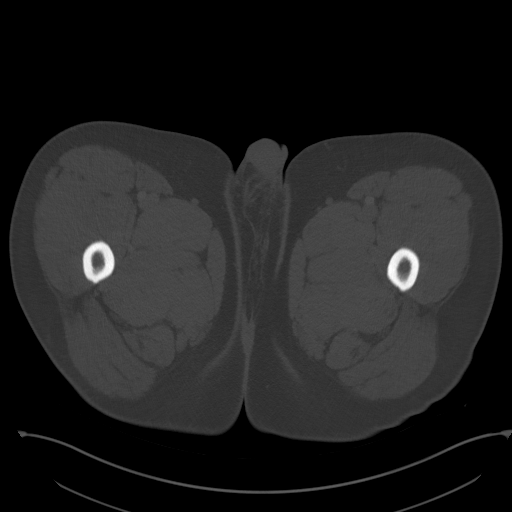
[im 20/122  soft-tissue]
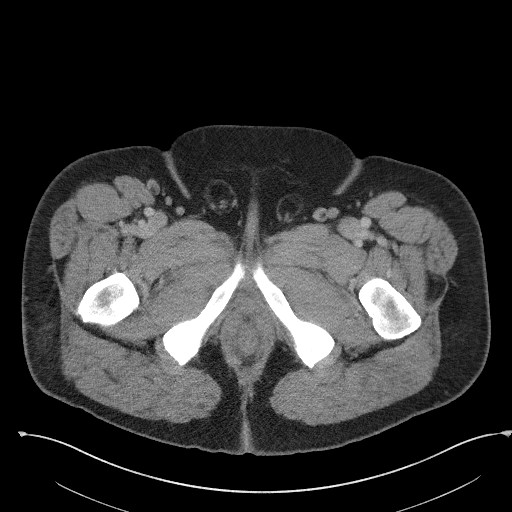
[im 26/122  soft-tissue]
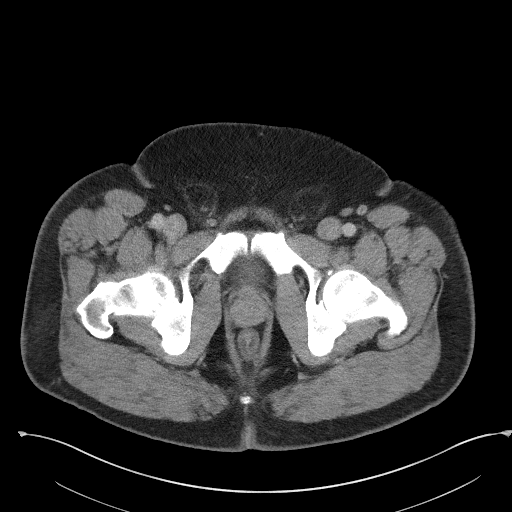
[im 39/122  soft-tissue]
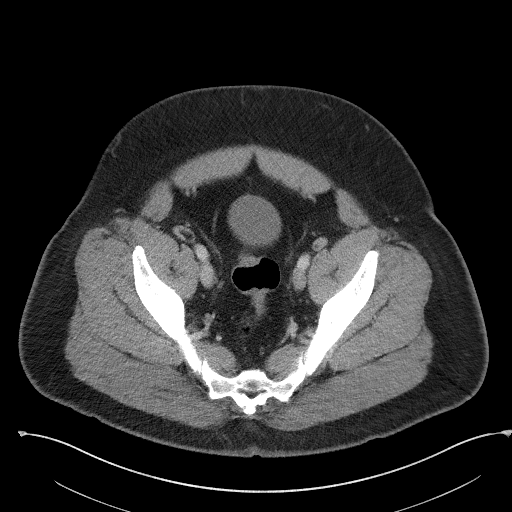
[im 45/122  soft-tissue]
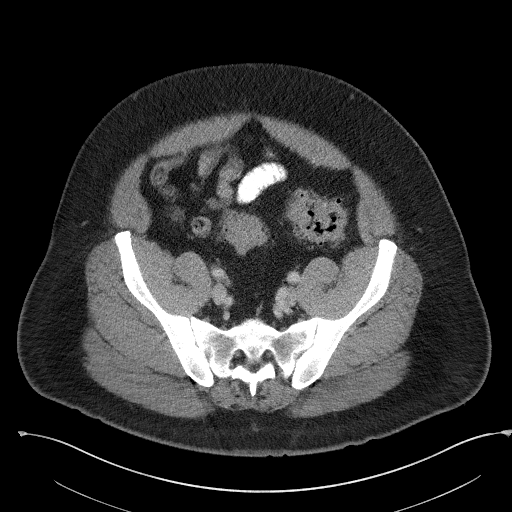
[im 58/122  soft-tissue]
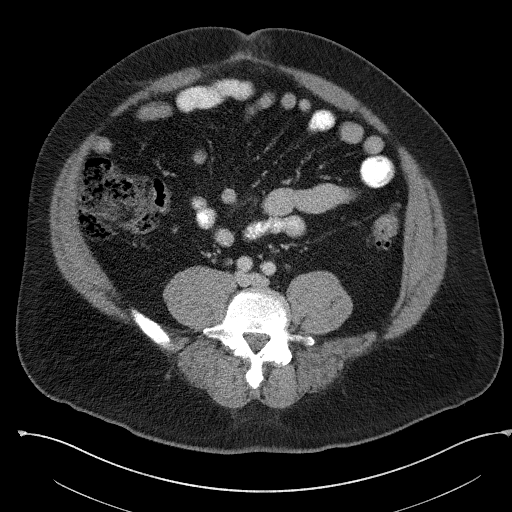
[im 64/122  soft-tissue]
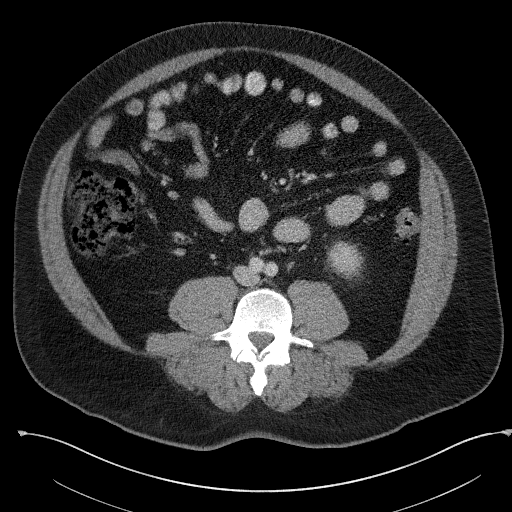
[im 77/122  soft-tissue]
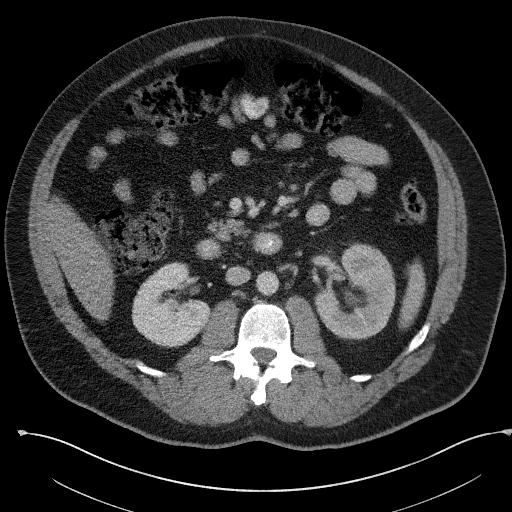
[im 83/122  soft-tissue]
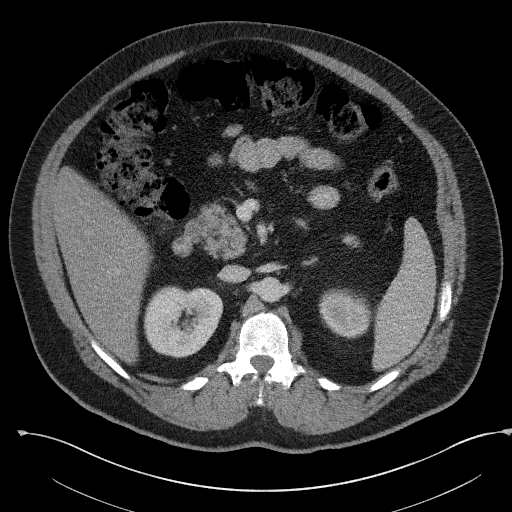
[im 83/122  bone]
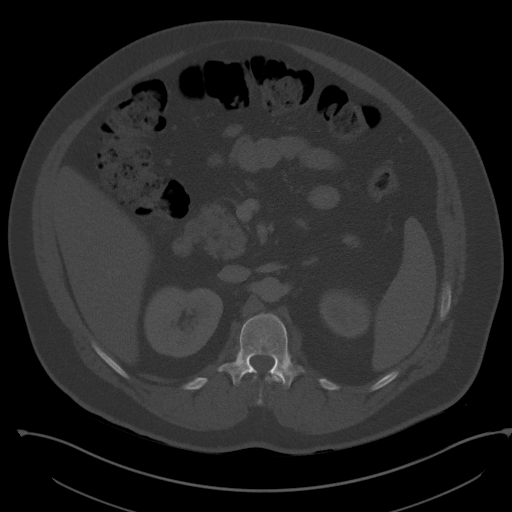
[im 96/122  soft-tissue]
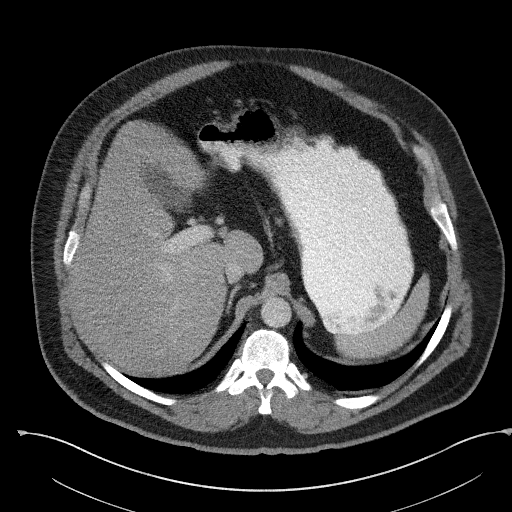
[im 102/122  soft-tissue]
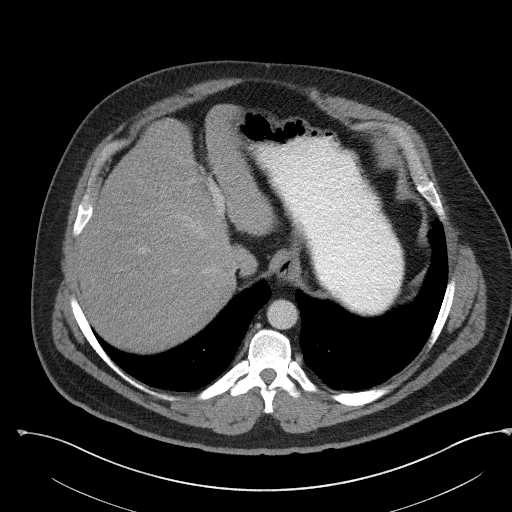
[im 115/122  soft-tissue]
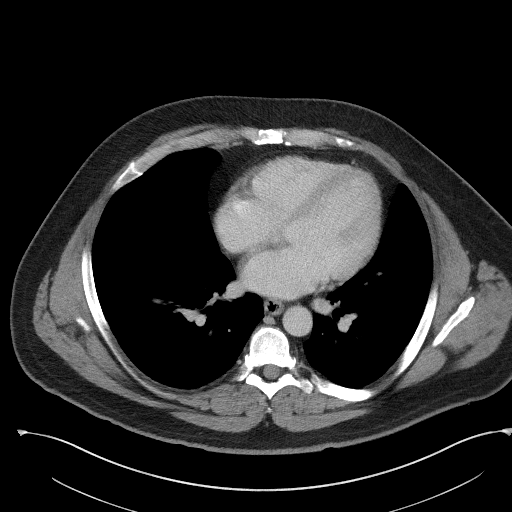

[Series 6: coronal st · coronal · 0.89mm/px · 3 of 113 slices shown]
[im 38/113  soft-tissue]
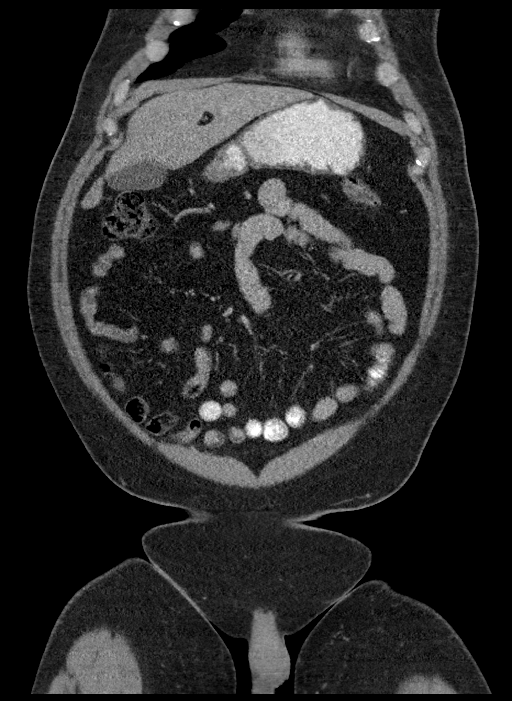
[im 50/113  soft-tissue]
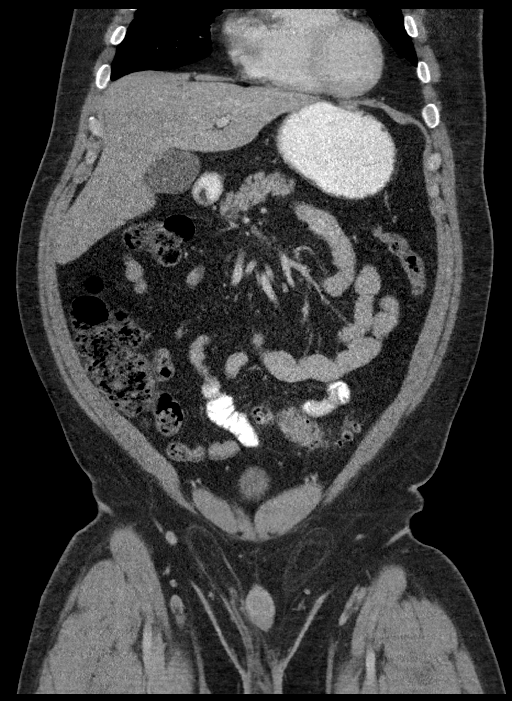
[im 63/113  soft-tissue]
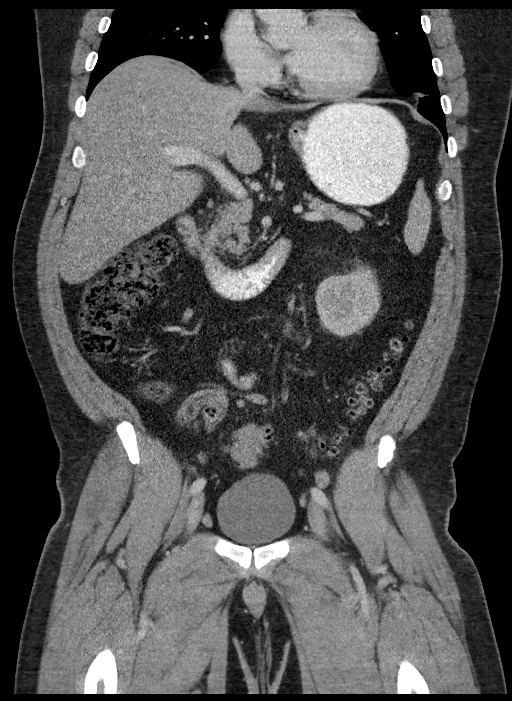

[15 of 46 positions shown; findings below may reference images not displayed]

FINDINGS: Lower chest: Mild cardiomegaly. No pericardial effusion. Negative
lung bases aside from mild lower lobe atelectasis or scarring. No
pleural effusion.

Hepatobiliary: Generalized decreased density throughout the liver in
keeping with steatosis. Negative gallbladder.

Pancreas: Negative.

Spleen: Negative.

Adrenals/Urinary Tract: Normal adrenal glands.

Normal right kidney and right ureter.

Unremarkable urinary bladder.

Abnormal left renal hypoenhancement associated with hydronephrosis,
nephro megaly, and mild perinephric stranding. There is an
obstructing 6-7 millimeter calculus in the proximal left ureter
about 3 centimeters distal to the left ureterovesical junction. No
other nephrolithiasis or urologic calculus identified. Beyond the
obstruction the left ureter is decompressed and normal.

Stomach/Bowel: Negative rectum. Severe diverticulosis of the sigmoid
colon and continuing into the descending colon. However, no active
inflammation is identified. Negative transverse colon aside from
redundancy and retained stool. Negative right colon. Normal
appendix. Negative terminal ileum.

Oral contrast has not yet reached the distal small bowel. There is a
midline umbilical mesenteric fat containing hernia with the hernia
neck measuring about 15 millimeters diameter, and hernia sac of
approximately 53 millimeters. No associated inflammation.

No dilated small bowel. The stomach is moderately distended with
oral contrast. There is a tiny duodenum diverticulum on series 5,
image 42 which appears inconsequential.

No abdominal free air or free fluid.

Vascular/Lymphatic: Major arterial structures in the abdomen and
pelvis are patent with no atherosclerosis. The portal venous system
is patent.

No lymphadenopathy in the abdomen. Maximal external iliac chains
lymph nodes might be reactive, perhaps related to the chronic
sigmoid diverticulosis.

Reproductive: Negative.

Other: No pelvic free fluid.

Musculoskeletal: No acute osseous abnormality identified. Lower
lumbar disc, endplate, and posterior element degeneration.
IMPRESSION: 1. Acute obstructive uropathy on the left. Obstructing 6-7 mm
proximal left ureteral calculus located about 3 centimeters distal
to the left UVJ.
2. No other urologic calculus or acute inflammation identified in
the abdomen or pelvis.
3. Mesenteric fat containing umbilical hernia.
4. Severe diverticulosis of the descending and sigmoid colon.
5. Hepatic steatosis.
6. Mild cardiomegaly.

## 2019-09-24 LAB — HM DIABETES EYE EXAM

## 2019-09-26 ENCOUNTER — Encounter: Payer: Self-pay | Admitting: Family Medicine

## 2019-09-28 ENCOUNTER — Encounter: Payer: Self-pay | Admitting: Family Medicine

## 2019-10-08 ENCOUNTER — Other Ambulatory Visit: Payer: Self-pay

## 2019-10-08 ENCOUNTER — Encounter: Payer: Self-pay | Admitting: Family Medicine

## 2019-10-08 ENCOUNTER — Ambulatory Visit: Payer: 59 | Admitting: Family Medicine

## 2019-10-08 VITALS — BP 114/70 | HR 88 | Temp 98.1°F | Ht 71.0 in | Wt 298.2 lb

## 2019-10-08 DIAGNOSIS — IMO0002 Reserved for concepts with insufficient information to code with codable children: Secondary | ICD-10-CM

## 2019-10-08 DIAGNOSIS — E1140 Type 2 diabetes mellitus with diabetic neuropathy, unspecified: Secondary | ICD-10-CM | POA: Diagnosis not present

## 2019-10-08 DIAGNOSIS — I10 Essential (primary) hypertension: Secondary | ICD-10-CM

## 2019-10-08 DIAGNOSIS — E1165 Type 2 diabetes mellitus with hyperglycemia: Secondary | ICD-10-CM | POA: Diagnosis not present

## 2019-10-08 LAB — POCT GLYCOSYLATED HEMOGLOBIN (HGB A1C): Hemoglobin A1C: 8.7 % — AB (ref 4.0–5.6)

## 2019-10-08 MED ORDER — NAPROXEN 500 MG PO TABS: 500.0000 mg | ORAL_TABLET | Freq: Two times a day (BID) | ORAL | 1 refills | Status: DC | PRN

## 2019-10-08 MED ORDER — OZEMPIC (0.25 OR 0.5 MG/DOSE) 2 MG/1.5ML ~~LOC~~ SOPN: PEN_INJECTOR | SUBCUTANEOUS | 6 refills | Status: DC

## 2019-10-08 NOTE — Patient Instructions (Addendum)
Price out ozempic weekly non insulin shot. Start at 0.25mg  weekly if tolerating well, after 2 weeks may increase to 0.5mg  weekly.  Other option is trulicity (different brand, same family of medicine).  Return in 3 months for diabetes follow up visit.

## 2019-10-08 NOTE — Progress Notes (Signed)
This visit was conducted in person.  BP 114/70 (BP Location: Left Arm, Patient Position: Sitting, Cuff Size: Large)   Pulse 88   Temp 98.1 F (36.7 C) (Temporal)   Ht 5\' 11"  (1.803 m)   Wt 298 lb 4 oz (135.3 kg)   SpO2 96%   BMI 41.60 kg/m    CC: 21mo DM f/u visit Subjective:    Patient ID: Jorge Boyle, male    DOB: 1976-01-16, 43 y.o.   MRN: [id number]  HPI: Jorge Boyle is a 43 y.o. male presenting on 10/08/2019 for Diabetes (Here for 3 mo f/u.)    DM - does not regularly check sugars . Compliant with antihyperglycemic regimen which includes: metformin 1000mg  bid, amaryl 2mg  daily. Denies low sugars or hypoglycemic symptoms. Denies paresthesias. Last diabetic eye exam 09/2019. Pneumovax: 07/2015. Prevnar: not due. Glucometer brand: one touch. DSME: declines due to time committment. Lab Results  Component Value Date   HGBA1C 8.7 (A) 10/08/2019   Diabetic Foot Exam - Simple   No data filed     Lab Results  Component Value Date   MICROALBUR 0.9 03/04/2019     H/o R thyroidectomy for thyroid adenoma. No personal or fmhx thyroid cancer.   Saw PMR with MRI showing DDD of lumbar spine (no MRI records available) - rec weight loss, consider PT     Relevant past medical, surgical, family and social history reviewed and updated as indicated. Interim medical history since our last visit reviewed. Allergies and medications reviewed and updated. Outpatient Medications Prior to Visit  Medication Sig Dispense Refill  . atorvastatin (LIPITOR) 20 MG tablet Take 1 tablet (20 mg total) by mouth daily. 90 tablet 3  . COLCRYS 0.6 MG tablet TAKE 1 TABLET (0.6 MG TOTAL) BY MOUTH DAILY. MAY TAKE 2 TABLETS ON THE FIRST DAY OF USE 90 tablet 0  . glimepiride (AMARYL) 2 MG tablet Take 1 tablet (2 mg total) by mouth daily with breakfast. 90 tablet 3  . losartan (COZAAR) 100 MG tablet Take 1 tablet (100 mg total) by mouth daily. 90 tablet 3  . metFORMIN (GLUCOPHAGE) 1000 MG tablet  TAKE 1 TABLET BY MOUTH TWO  TIMES DAILY WITH A MEAL 180 tablet 3  . methocarbamol (ROBAXIN) 500 MG tablet Take 1 tablet (500 mg total) by mouth every 8 (eight) hours as needed for muscle spasms. 21 tablet 0  . ONETOUCH DELICA LANCETS 99991111 MISC 1 each by Does not apply route as directed. Use as directed to check blood sugars once daily.  Dx code:  E11.42 100 each 0  . naproxen (NAPROSYN) 500 MG tablet Take 1 tablet (500 mg total) by mouth 2 (two) times daily as needed for moderate pain. 60 tablet 1   Facility-Administered Medications Prior to Visit  Medication Dose Route Frequency Provider Last Rate Last Admin  . betamethasone acetate-betamethasone sodium phosphate (CELESTONE) injection 3 mg  3 mg Intramuscular Once Edrick Kins, DPM         Per HPI unless specifically indicated in ROS section below Review of Systems Objective:    BP 114/70 (BP Location: Left Arm, Patient Position: Sitting, Cuff Size: Large)   Pulse 88   Temp 98.1 F (36.7 C) (Temporal)   Ht 5\' 11"  (1.803 m)   Wt 298 lb 4 oz (135.3 kg)   SpO2 96%   BMI 41.60 kg/m   Wt Readings from Last 3 Encounters:  10/08/19 298 lb 4 oz (135.3 kg)  08/02/19 300 lb (  136.1 kg)  07/09/19 (!) 300 lb 7 oz (136.3 kg)    Physical Exam Vitals and nursing note reviewed.  Constitutional:      General: He is not in acute distress.    Appearance: Normal appearance. He is well-developed. He is obese. He is not ill-appearing.  Eyes:     General: No scleral icterus.    Extraocular Movements: Extraocular movements intact.     Conjunctiva/sclera: Conjunctivae normal.     Pupils: Pupils are equal, round, and reactive to light.  Cardiovascular:     Rate and Rhythm: Normal rate and regular rhythm.     Pulses: Normal pulses.     Heart sounds: Normal heart sounds. No murmur.  Pulmonary:     Effort: Pulmonary effort is normal. No respiratory distress.     Breath sounds: Normal breath sounds. No wheezing, rhonchi or rales.  Musculoskeletal:      Cervical back: Normal range of motion and neck supple.     Comments: See HPI for foot exam if done  Lymphadenopathy:     Cervical: No cervical adenopathy.  Skin:    General: Skin is warm and dry.     Findings: No rash.  Neurological:     Mental Status: He is alert.  Psychiatric:        Mood and Affect: Mood normal.        Behavior: Behavior normal.       Results for orders placed or performed in visit on 10/08/19  POCT glycosylated hemoglobin (Hb A1C)  Result Value Ref Range   Hemoglobin A1C 8.7 (A) 4.0 - 5.6 %   HbA1c POC (<> result, manual entry)     HbA1c, POC (prediabetic range)     HbA1c, POC (controlled diabetic range)     Assessment & Plan:  This visit occurred during the SARS-CoV-2 public health emergency.  Safety protocols were in place, including screening questions prior to the visit, additional usage of staff PPE, and extensive cleaning of exam room while observing appropriate contact time as indicated for disinfecting solutions.   Problem List Items Addressed This Visit    Type 2 diabetes mellitus, uncontrolled, with neuropathy ([place]) - Primary    Slight improvement but still above goal. Recommend he price out weekly GLP1 RA (trulicity or ozempic). Reviewed mechanism of action of meds. Continue current regimen otherwise. Foot exam today. RTC 3 mo DM f/u visit.       Relevant Medications   Semaglutide,0.25 or 0.5MG /DOS, (OZEMPIC, 0.25 OR 0.5 MG/DOSE,) 2 MG/1.5ML SOPN   Other Relevant Orders   POCT glycosylated hemoglobin (Hb A1C) (Completed)   Essential hypertension    Chronic, stable. Continue current regimen.           Meds ordered this encounter  Medications  . naproxen (NAPROSYN) 500 MG tablet    Sig: Take 1 tablet (500 mg total) by mouth 2 (two) times daily as needed for moderate pain.    Dispense:  180 tablet    Refill:  1  . Semaglutide,0.25 or 0.5MG /DOS, (OZEMPIC, 0.25 OR 0.5 MG/DOSE,) 2 MG/1.5ML SOPN    Sig: Inject 0.25 mg into the skin once a  week for 14 days, THEN 0.5 mg once a week.    Dispense:  1 pen    Refill:  6   Orders Placed This Encounter  Procedures  . POCT glycosylated hemoglobin (Hb A1C)    Patient Instructions  Price out ozempic weekly non insulin shot. Start at 0.25mg  weekly if tolerating well, after  2 weeks may increase to 0.5mg  weekly.  Other option is trulicity (different brand, same family of medicine).  Return in 3 months for diabetes follow up visit.    Follow up plan: Return in about 3 months (around 01/06/2020) for follow up visit.  Ria Bush, MD

## 2019-10-08 NOTE — Assessment & Plan Note (Signed)
Chronic, stable. Continue current regimen. 

## 2019-10-08 NOTE — Assessment & Plan Note (Signed)
Slight improvement but still above goal. Recommend he price out weekly GLP1 RA (trulicity or ozempic). Reviewed mechanism of action of meds. Continue current regimen otherwise. Foot exam today. RTC 3 mo DM f/u visit.

## 2019-10-09 ENCOUNTER — Other Ambulatory Visit: Payer: Self-pay | Admitting: Family Medicine

## 2020-01-08 ENCOUNTER — Encounter: Payer: Self-pay | Admitting: Family Medicine

## 2020-01-08 NOTE — Telephone Encounter (Signed)
FYI Appt for Monday cancelled d/t Covid Exposure and patient being tested.  He will let us know the results.

## 2020-01-10 ENCOUNTER — Ambulatory Visit: Payer: 59 | Admitting: Family Medicine

## 2020-01-10 ENCOUNTER — Telehealth: Payer: Self-pay | Admitting: Family Medicine

## 2020-01-10 NOTE — Telephone Encounter (Signed)
Noted. See mychart message. Would offer to reschedule today's appt (3 mo DM f/u visit) at his convenience

## 2020-01-10 NOTE — Telephone Encounter (Signed)
Patient's mother called today She wanted to let you know that the patient's covid test came back negative and he has also returned to work

## 2020-01-11 NOTE — Telephone Encounter (Addendum)
Left message on vm per dpr asking pt to call back.  Needs to r/s 3 mo DM f/u.

## 2020-01-14 ENCOUNTER — Ambulatory Visit: Payer: 59 | Admitting: Family Medicine

## 2020-01-14 ENCOUNTER — Ambulatory Visit (INDEPENDENT_AMBULATORY_CARE_PROVIDER_SITE_OTHER)
Admission: RE | Admit: 2020-01-14 | Discharge: 2020-01-14 | Disposition: A | Discharge status: Home / Self Care | Payer: 59 | Source: Ambulatory Visit | Attending: Family Medicine | Admitting: Family Medicine

## 2020-01-14 ENCOUNTER — Other Ambulatory Visit: Payer: Self-pay

## 2020-01-14 ENCOUNTER — Encounter: Payer: Self-pay | Admitting: Family Medicine

## 2020-01-14 VITALS — BP 130/82 | HR 100 | Temp 98.0°F | Ht 71.0 in | Wt 301.0 lb

## 2020-01-14 DIAGNOSIS — E1140 Type 2 diabetes mellitus with diabetic neuropathy, unspecified: Secondary | ICD-10-CM | POA: Diagnosis not present

## 2020-01-14 DIAGNOSIS — E1165 Type 2 diabetes mellitus with hyperglycemia: Secondary | ICD-10-CM | POA: Diagnosis not present

## 2020-01-14 DIAGNOSIS — M542 Cervicalgia: Secondary | ICD-10-CM | POA: Diagnosis not present

## 2020-01-14 DIAGNOSIS — IMO0002 Reserved for concepts with insufficient information to code with codable children: Secondary | ICD-10-CM

## 2020-01-14 LAB — POCT GLYCOSYLATED HEMOGLOBIN (HGB A1C): Hemoglobin A1C: 7.3 % — AB (ref 4.0–5.6)

## 2020-01-14 MED ORDER — ORPHENADRINE CITRATE ER 100 MG PO TB12: 100.0000 mg | ORAL_TABLET | Freq: Two times a day (BID) | ORAL | 0 refills | Status: DC | PRN

## 2020-01-14 MED ORDER — OZEMPIC (0.25 OR 0.5 MG/DOSE) 2 MG/1.5ML ~~LOC~~ SOPN: 0.5000 mg | PEN_INJECTOR | SUBCUTANEOUS | 11 refills | Status: DC

## 2020-01-14 NOTE — Progress Notes (Signed)
This visit was conducted in person.  BP 130/82 (BP Location: Left Arm, Patient Position: Sitting, Cuff Size: Large)   Pulse 100   Temp 98 F (36.7 C) (Temporal)   Ht 5\' 11"  (1.803 m)   Wt (!) 301 lb (136.5 kg)   SpO2 95%   BMI 41.98 kg/m    CC: 3 mo f/u visit  Subjective:    Patient ID: Jorge Boyle, male    DOB: 22-Dec-1975, 44 y.o.   MRN: [id number]  HPI: Jorge Boyle is a 44 y.o. male presenting on 01/14/2020 for Diabetes (Here for 3 mo f/u.) and Arm Pain (C/o R arm pain.  Seen and treated [w/ muscle relaxer and pain med] 01/04/20 for right side neck pain.  Pain is now radiating down into R arm and disturbing sleep.  Meds not helping. )   DM - does regularly check sugars: better control noted. Compliant with antihyperglycemic regimen which includes: amaryl 2mg  daily, metformin 1000mg  bid, ozempic 0.5mg  weekly. Denies low sugars or hypoglycemic symptoms. Lowest sugar is 80 after amaryl. Denies paresthesias. Last diabetic eye exam 09/2019. Pneumovax: [id number]. Prevnar: not due. Glucometer brand: thinks one touch. DSME: declines.  Lab Results  Component Value Date   HGBA1C 7.3 (A) 01/14/2020   Diabetic Foot Exam - Simple   No data filed     Lab Results  Component Value Date   MICROALBUR 0.9 03/04/2019     R arm/shoulder and chest pain for the past 2 weeks - seen at [facility] 01/04/2020 treated with naprosyn, norco, skelaxin. Pain started in neck several weeks ago - with persistent neck tightness/muscle spasm. Some numbness of R arm when overused but not otherwise. 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 Feels best when he keeps hand/arm flexed across chest. Also taking tylenol and icy hot.   L handed.      Relevant past medical, surgical, family and social history reviewed and updated as indicated. Interim medical history since our last visit reviewed. Allergies and medications reviewed and updated. Outpatient Medications Prior to Visit  Medication Sig Dispense  Refill  . atorvastatin (LIPITOR) 20 MG tablet Take 1 tablet (20 mg total) by mouth daily. 90 tablet 3  . COLCRYS 0.6 MG tablet TAKE 1 TABLET (0.6 MG TOTAL) BY MOUTH DAILY. MAY TAKE 2 TABLETS ON THE FIRST DAY OF USE 90 tablet 0  . glimepiride (AMARYL) 2 MG tablet Take 1 tablet (2 mg total) by mouth daily with breakfast. 90 tablet 3  . HYDROcodone-acetaminophen (NORCO/VICODIN) 5-325 MG tablet Take one tablet at night for pain; may take up to every 6 hours as needed for pain if not working or driving    . losartan (COZAAR) 100 MG tablet Take 1 tablet (100 mg total) by mouth daily. 90 tablet 3  . metFORMIN (GLUCOPHAGE) 1000 MG tablet TAKE 1 TABLET BY MOUTH TWO  TIMES DAILY WITH A MEAL 180 tablet 3  . naproxen (NAPROSYN) 500 MG tablet Take 1 tablet (500 mg total) by mouth 2 (two) times daily as needed for moderate pain. 180 tablet 1  . ONETOUCH DELICA LANCETS 99991111 MISC 1 each by Does not apply route as directed. Use as directed to check blood sugars once daily.  Dx code:  E11.42 100 each 0  . metaxalone (SKELAXIN) 800 MG tablet Take 800 mg by mouth 3 (three) times daily.    . methocarbamol (ROBAXIN) 500 MG tablet Take 1 tablet (500 mg total) by mouth every 8 (eight) hours as needed  for muscle spasms. 21 tablet 0   Facility-Administered Medications Prior to Visit  Medication Dose Route Frequency Provider Last Rate Last Admin  . betamethasone acetate-betamethasone sodium phosphate (CELESTONE) injection 3 mg  3 mg Intramuscular Once Edrick Kins, DPM         Per HPI unless specifically indicated in ROS section below Review of Systems Objective:    BP 130/82 (BP Location: Left Arm, Patient Position: Sitting, Cuff Size: Large)   Pulse 100   Temp 98 F (36.7 C) (Temporal)   Ht 5\' 11"  (1.803 m)   Wt (!) 301 lb (136.5 kg)   SpO2 95%   BMI 41.98 kg/m   Wt Readings from Last 3 Encounters:  01/14/20 (!) 301 lb (136.5 kg)  10/08/19 298 lb 4 oz (135.3 kg)  08/02/19 300 lb (136.1 kg)    Physical  Exam Vitals and nursing note reviewed.  Constitutional:      General: He is not in acute distress.    Appearance: Normal appearance. He is well-developed. He is obese. He is not ill-appearing.  HENT:     Head: Normocephalic and atraumatic.     Right Ear: External ear normal.     Left Ear: External ear normal.     Nose: Nose normal.     Mouth/Throat:     Pharynx: No oropharyngeal exudate.  Eyes:     General: No scleral icterus.    Conjunctiva/sclera: Conjunctivae normal.     Pupils: Pupils are equal, round, and reactive to light.  Neck:     Comments:  FROM cervical neck with discomfort to R lateral flexion  No significant discomfort paracervical mm Neg spurling test bilaterally Tender to palpation along R trapezius m Cardiovascular:     Rate and Rhythm: Normal rate and regular rhythm.     Heart sounds: Normal heart sounds. No murmur.  Pulmonary:     Effort: Pulmonary effort is normal. No respiratory distress.     Breath sounds: Normal breath sounds. No wheezing or rales.  Musculoskeletal:        General: Normal range of motion.     Cervical back: Normal range of motion and neck supple.     Comments:  See HPI for foot exam if done FROM at shoulders without pain to palpation of shoulders  Lymphadenopathy:     Cervical: No cervical adenopathy.  Skin:    General: Skin is warm and dry.     Findings: No rash.  Neurological:     Mental Status: He is alert.     Sensory: Sensation is intact.     Deep Tendon Reflexes:     Reflex Scores:      Bicep reflexes are 2+ on the right side and 2+ on the left side.    Comments:  5/5 strength BUE Sensation intact to light touch throughout BUE       Results for orders placed or performed in visit on 01/14/20  POCT glycosylated hemoglobin (Hb A1C)  Result Value Ref Range   Hemoglobin A1C 7.3 (A) 4.0 - 5.6 %   HbA1c POC (<> result, manual entry)     HbA1c, POC (prediabetic range)     HbA1c, POC (controlled diabetic range)     DG  Cervical Spine Complete CLINICAL DATA:  Neck pain and right shoulder and arm pain and numbness.  EXAM: CERVICAL SPINE - COMPLETE 4+ VIEW  COMPARISON:  CT scan of the neck dated 08/09/2004  FINDINGS: There is no fracture or bone destruction.  Prevertebral soft tissues are normal. Slight disc space narrowing at C3-4 and C5-6 and C6-7. Slight reversal of the normal cervical lordosis, unchanged since 2005.  No facet arthritis. No foraminal stenosis.  IMPRESSION: Slight degenerative disc disease at C3-4 and C5-6 and C6-7.  Electronically Signed   By: Lorriane Shire M.D.   On: 01/14/2020 13:38   Assessment & Plan:  This visit occurred during the SARS-CoV-2 public health emergency.  Safety protocols were in place, including screening questions prior to the visit, additional usage of staff PPE, and extensive cleaning of exam room while observing appropriate contact time as indicated for disinfecting solutions.   Problem List Items Addressed This Visit    Type 2 diabetes mellitus, uncontrolled, with neuropathy ([place]) - Primary    Improving glycemic control with addition of ozempic. Reviewed importance of monitoring for hypoglycemia as GLP1 RA are long acting medications and may see ongoing drop in sugars. Continue current regimen.       Relevant Medications   Semaglutide,0.25 or 0.5MG /DOS, (OZEMPIC, 0.25 OR 0.5 MG/DOSE,) 2 MG/1.5ML SOPN   Other Relevant Orders   POCT glycosylated hemoglobin (Hb A1C) (Completed)   Obesity, morbid, BMI 40.0-49.9 ([place])    Unfortunately no significant weight loss noted on ozempic - consider increasing to full 1mg  weekly dose      Relevant Medications   Semaglutide,0.25 or 0.5MG /DOS, (OZEMPIC, 0.25 OR 0.5 MG/DOSE,) 2 MG/1.5ML SOPN   Neck pain on right side    Describes some radiculopathy type symptoms (numbness to R arm when overused) however exam more consistent with trapezius mm strain/spasm. Treat as such with NSAID (naprosyn), muscle relaxant (pt  states norflex works best for him), heating pad, gentle stretching, and rest. Written out of work for next 2 days. He states he does not do heavy lifting at work. Update if not improving.  I did suggest baseline cervical films given ?radiculopathy to evaluate for DDD burden.       Relevant Orders   DG Cervical Spine Complete (Completed)       Meds ordered this encounter  Medications  . Semaglutide,0.25 or 0.5MG /DOS, (OZEMPIC, 0.25 OR 0.5 MG/DOSE,) 2 MG/1.5ML SOPN    Sig: Inject 0.5 mg into the skin once a week.    Dispense:  1 pen    Refill:  11  . orphenadrine (NORFLEX) 100 MG tablet    Sig: Take 1 tablet (100 mg total) by mouth 2 (two) times daily as needed for muscle spasms.    Dispense:  40 tablet    Refill:  0   Orders Placed This Encounter  Procedures  . DG Cervical Spine Complete    Standing Status:   Future    Number of Occurrences:   1    Standing Expiration Date:   03/15/2021    Order Specific Question:   Reason for Exam (SYMPTOM  OR DIAGNOSIS REQUIRED)    Answer:   neck pain with radiation to R shoulder/arm/numbness    Order Specific Question:   Preferred imaging location?    Answer:   [facility]    Order Specific Question:   Radiology Contrast Protocol - do NOT remove file path    Answer:   [email or web address]  . POCT glycosylated hemoglobin (Hb A1C)    Patient Instructions  Neck xrays today I think you have spasm/tightness of R trapezius muscles.  Treat with muscle relaxant sent to pharmacy, use naprosyn 500mg  twice daily with meals for 5 days then as needed.  Heating pad to the  neck.  Let us know if not improving with this. Sugars are getting much better! Congratulations - continue ozempic weekly shot.  Out of work Midwife.    Follow up plan: Return if symptoms worsen or fail to improve.  Ria Bush, MD

## 2020-01-14 NOTE — Patient Instructions (Signed)
Neck xrays today I think you have spasm/tightness of R trapezius muscles.  Treat with muscle relaxant sent to pharmacy, use naprosyn 500mg  twice daily with meals for 5 days then as needed.  Heating pad to the neck.  Let us know if not improving with this. Sugars are getting much better! Congratulations - continue ozempic weekly shot.  Out of work Midwife.

## 2020-01-15 DIAGNOSIS — R221 Localized swelling, mass and lump, neck: Secondary | ICD-10-CM | POA: Insufficient documentation

## 2020-01-15 DIAGNOSIS — M542 Cervicalgia: Secondary | ICD-10-CM | POA: Insufficient documentation

## 2020-01-15 NOTE — Assessment & Plan Note (Signed)
Unfortunately no significant weight loss noted on ozempic - consider increasing to full 1mg  weekly dose

## 2020-01-15 NOTE — Assessment & Plan Note (Signed)
Describes some radiculopathy type symptoms (numbness to R arm when overused) however exam more consistent with trapezius mm strain/spasm. Treat as such with NSAID (naprosyn), muscle relaxant (pt states norflex works best for him), heating pad, gentle stretching, and rest. Written out of work for next 2 days. He states he does not do heavy lifting at work. Update if not improving.  I did suggest baseline cervical films given ?radiculopathy to evaluate for DDD burden.

## 2020-01-15 NOTE — Assessment & Plan Note (Signed)
Improving glycemic control with addition of ozempic. Reviewed importance of monitoring for hypoglycemia as GLP1 RA are long acting medications and may see ongoing drop in sugars. Continue current regimen.

## 2020-02-17 ENCOUNTER — Other Ambulatory Visit: Payer: Self-pay

## 2020-02-17 MED ORDER — OZEMPIC (0.25 OR 0.5 MG/DOSE) 2 MG/1.5ML ~~LOC~~ SOPN: 0.5000 mg | PEN_INJECTOR | SUBCUTANEOUS | 3 refills | Status: DC

## 2020-02-17 NOTE — Addendum Note (Signed)
Addended by: Brenton Grills on: [id number] PM   Modules accepted: Orders

## 2020-03-05 ENCOUNTER — Other Ambulatory Visit: Payer: Self-pay | Admitting: Family Medicine

## 2020-05-03 ENCOUNTER — Other Ambulatory Visit: Payer: Self-pay | Admitting: Family Medicine

## 2020-06-28 ENCOUNTER — Other Ambulatory Visit: Payer: Self-pay | Admitting: Family Medicine

## 2020-06-29 ENCOUNTER — Other Ambulatory Visit: Payer: Self-pay | Admitting: Family Medicine

## 2020-06-29 NOTE — Telephone Encounter (Signed)
E-scribed refill.  Plz schedule cpe and lab visits.  

## 2020-07-04 ENCOUNTER — Encounter: Payer: Self-pay | Admitting: Family Medicine

## 2020-07-04 DIAGNOSIS — IMO0002 Reserved for concepts with insufficient information to code with codable children: Secondary | ICD-10-CM

## 2020-07-05 MED ORDER — MICROLET LANCETS MISC: 3 refills | Status: AC

## 2020-07-05 MED ORDER — CONTOUR NEXT TEST VI STRP: ORAL_STRIP | 3 refills | Status: DC

## 2020-07-05 MED ORDER — CONTOUR NEXT MONITOR W/DEVICE KIT: PACK | 0 refills | Status: DC

## 2020-07-05 NOTE — Telephone Encounter (Signed)
E-scribed new rx for Contour Next meter, test strips and Microlet lancets.

## 2020-07-06 ENCOUNTER — Telehealth: Payer: Self-pay | Admitting: Family Medicine

## 2020-07-06 NOTE — Telephone Encounter (Signed)
Mom (diane) called to schedule pt an appointment for thyroid follow up.  She stated right side of neck is swollen. And she stated he said that when wearing mask he chokes.  She stated its when the mask on neck he chokes.   No shortness of breath  Mom stated pt works 3rd shift and he is alseep right now.  She will have him call the office so he can be triaged

## 2020-07-06 NOTE — Telephone Encounter (Signed)
Lvm asking pt to call back.  Needs to schedule OV for neck swelling.

## 2020-07-06 NOTE — Telephone Encounter (Signed)
Should be ok to schedule in office. Thanks.

## 2020-07-06 NOTE — Telephone Encounter (Signed)
Pt scheduled tomorrow with Dr. Damita Dunnings at 4:00.

## 2020-07-07 ENCOUNTER — Encounter: Payer: Self-pay | Admitting: Family Medicine

## 2020-07-07 ENCOUNTER — Ambulatory Visit: Payer: 59 | Admitting: Family Medicine

## 2020-07-07 ENCOUNTER — Other Ambulatory Visit: Payer: Self-pay

## 2020-07-07 VITALS — BP 142/80 | HR 89 | Temp 95.7°F | Ht 71.0 in | Wt 297.3 lb

## 2020-07-07 DIAGNOSIS — E1140 Type 2 diabetes mellitus with diabetic neuropathy, unspecified: Secondary | ICD-10-CM

## 2020-07-07 DIAGNOSIS — E041 Nontoxic single thyroid nodule: Secondary | ICD-10-CM | POA: Diagnosis not present

## 2020-07-07 DIAGNOSIS — IMO0002 Reserved for concepts with insufficient information to code with codable children: Secondary | ICD-10-CM

## 2020-07-07 DIAGNOSIS — E1165 Type 2 diabetes mellitus with hyperglycemia: Secondary | ICD-10-CM | POA: Diagnosis not present

## 2020-07-07 MED ORDER — CONTOUR NEXT MONITOR W/DEVICE KIT: PACK | 0 refills | Status: DC

## 2020-07-07 NOTE — Patient Instructions (Signed)
Go to the lab on the way out.   If you have mychart we'll likely use that to update you.    We'll set up an ultrasound.  Don't stop your regular meds at this point.  Dr. Darnell Level is checking on your sugar meter.  Take care.  Glad to see you.

## 2020-07-07 NOTE — Telephone Encounter (Signed)
I spoke with pt;pt notified as instructed; pt states is not having a hard time breathing now; pts neck is swollen below chin and down the neck; pt thinks that it is the thyroid is swelling; pt said 1/2 thyroid taken out years ago; the neck swelling has been going on 2 -3 wks. Anything tight around neck causes choking feeling. And last time choking feeling was last night; pt said he just got off work; I woke him up and he is going to try to go back to sleep and see Dr Damita Dunnings this afternoon. Pt is refusing to go to UC or ED now because he thinks the swelling in throat is due to thyroid; reviewed reasons for going to UC or ED now with swelling in throat and choking feeling if shirt or mask touches throat. Pt said I woke him up; he is gong to try to go back to sleep and will be at office at 4:00 pm. FYI to Dr Damita Dunnings.

## 2020-07-07 NOTE — Progress Notes (Signed)
This visit occurred during the SARS-CoV-2 public health emergency.  Safety protocols were in place, including screening questions prior to the visit, additional usage of staff PPE, and extensive cleaning of exam room while observing appropriate contact time as indicated for disinfecting solutions.  H/o partial thyroidectomy.  Sx started in the last few weeks. Not SOB.  Swallowing well.  If he lowers his mask around his chin/neck, it feels tighter than normal.  No FCNAVD.  No tongue swelling.  No rash, no hives.  Still on losartan- this isn't a new med.  ER cautions d/w pt.  Due for A1c, d/w pt.   see notes on labs.  Meds, vitals, and allergies reviewed.   ROS: Per HPI unless specifically indicated in ROS section   nad Ncat, tongue and lips without swelling., Neck supple, no LA, no stridor. He has a soft mass/fullness to the right of the midline just superior to the right medial clavicle.  Not tender to palpation. rrr ctab No wheeze.

## 2020-07-07 NOTE — Telephone Encounter (Signed)
If he has true airway sx, then to ER in the meantime.  Thanks.

## 2020-07-07 NOTE — Telephone Encounter (Signed)
Noted, will see at [facility].

## 2020-07-07 NOTE — Telephone Encounter (Signed)
[facility] - Client TELEPHONE ADVICE RECORD [facility] Patient Name: Jorge Boyle Gender: Male DOB: [birth year] Age: 44 Y 57 M 7 D Return Phone Number: [phone] (Primary) Address: City/State/Zip: [place] [address] Client [place] [facility] - Client Client Site [place] - Day Physician Ria Bush - MD Contact Type Call Who Is Calling Patient / Member / Family / Caregiver Call Type Triage / Clinical Relationship To Patient Self Return Phone Number [phone] (Primary) Chief Complaint Sore Throat Reason for Call Symptomatic / Request for [place] states the right side of the neck is swollen and it feels like he is choking. Translation No Nurse Assessment Nurse: Raenette Rover, RN, Zella Ball Date/Time (Eastern Time): 07/06/2020 4:13:03 PM Confirm and document reason for call. If symptomatic, describe symptoms. ---Patient states his neck has been swollen for the past two weeks. His thyroid feels swollen and he said this is the way it felt when he had part of it removed. Has the patient had close contact with a person known or suspected to have the novel coronavirus illness OR traveled / lives in area with major community spread (including international travel) in the last 14 days from the onset of symptoms? * If Asymptomatic, screen for exposure and travel within the last 14 days. ---No Does the patient have any new or worsening symptoms? ---Yes Will a triage be completed? ---Yes Related visit to physician within the last 2 weeks? ---No Does the PT have any chronic conditions? (i.e. diabetes, asthma, this includes High risk factors for pregnancy, etc.) ---Yes List chronic conditions. ---diabetes Is this a behavioral health or substance abuse call? ---No Guidelines Guideline Title Affirmed Question Affirmed Notes Nurse Date/Time Eilene Ghazi Time) Lymph Nodes - Swollen  [1] Single large node AND [2] size > 1 inch (2.5 cm) AND [3] no fever Raenette Rover, RN, Zella Ball 07/06/2020 4:15:36 PM Disp. Time Eilene Ghazi Time) Disposition Final User 07/06/2020 3:54:25 PM Attempt made - message left Raenette Rover, RN, Zella Ball PLEASE NOTE: All timestamps contained within this report are represented as [place] Standard Time. CONFIDENTIALTY NOTICE: This fax transmission is intended only for the addressee. It contains information that is legally privileged, confidential or otherwise protected from use or disclosure. If you are not the intended recipient, you are strictly prohibited from reviewing, disclosing, copying using or disseminating any of this information or taking any action in reliance on or regarding this information. If you have received this fax in error, please notify us immediately by telephone so that we can arrange for its return to [place]. Phone: [phone], Toll-Free: [phone], Fax: [phone] Page: 2 of 2 Call Id: [id number] 07/06/2020 4:18:58 PM See PCP within 24 Hours Yes Raenette Rover, RN, Herbert Deaner Disagree/Comply Comply Caller Understands Yes PreDisposition Did not know what to do Care Advice Given Per Guideline SEE PCP WITHIN 24 HOURS: CALL BACK IF: * You become worse CARE ADVICE given per Lymph Nodes Swollen (Adult) guideline. Referrals REFERRED TO PCP OFFICE

## 2020-07-07 NOTE — Telephone Encounter (Signed)
Pt already has appt to see Dr Damita Dunnings today at 4 PM.

## 2020-07-08 LAB — HEMOGLOBIN A1C
Hgb A1c MFr Bld: 7.9 % of total Hgb — ABNORMAL HIGH (ref <5.7)
Mean Plasma Glucose: 180 (calc)
eAG (mmol/L): 10 (calc)

## 2020-07-08 LAB — TSH: TSH: 1.67 mIU/L (ref 0.40–4.50)

## 2020-07-09 NOTE — Assessment & Plan Note (Signed)
History of, status post partial thyroidectomy, now with a feeling of a neck mass that was similar to his situation prior to previous surgery.  See exam above.  Reasonable to check TSH and thyroid ultrasound.  Routine cautions given to patient.  He agrees.  This does not appear to be any type of allergic reaction.  He has no lip or tongue swelling.  No stridor.  He agrees with plan.

## 2020-07-09 NOTE — Assessment & Plan Note (Signed)
See notes on labs. 

## 2020-07-13 ENCOUNTER — Other Ambulatory Visit: Payer: Self-pay | Admitting: Family Medicine

## 2020-07-14 ENCOUNTER — Other Ambulatory Visit: Payer: Self-pay

## 2020-07-14 ENCOUNTER — Telehealth: Payer: Self-pay | Admitting: Family Medicine

## 2020-07-14 ENCOUNTER — Ambulatory Visit
Admission: RE | Admit: 2020-07-14 | Discharge: 2020-07-14 | Disposition: A | Discharge status: Home / Self Care | Payer: 59 | Source: Ambulatory Visit | Attending: Family Medicine | Admitting: Family Medicine

## 2020-07-14 DIAGNOSIS — E041 Nontoxic single thyroid nodule: Secondary | ICD-10-CM | POA: Insufficient documentation

## 2020-07-14 NOTE — Telephone Encounter (Addendum)
Received call from [place], central scheduling at [facility].  States their schedule is full, cannot do bx until 07/17/20.  Pt needs to arrive at 2:00 at [facility] entrance at [facility].  He will not be sedated so no driver needed.  FYI to Dr. Darnell Level.   Left message on vm per dpr relaying appt info above.

## 2020-07-14 NOTE — Telephone Encounter (Signed)
Called centralized scheduling and this test must be performed through [facility] [phone]  I have attempted to call twice and have not been able to get anyone on the phone.  Left a message for that department to call back to schedule.

## 2020-07-14 NOTE — Telephone Encounter (Signed)
Noted. Thanks.

## 2020-07-14 NOTE — Telephone Encounter (Signed)
Received phone call from radiology department regarding thyroid US  No abnormality on right side where his symptoms were but large 3cm thyroid nodule on left recommend tissue biopsy. I have placed biopsy order and will see if we can expedite.  Will forward to Dr Damita Dunnings who saw patient and Jorge Boyle to schedule biopsy.

## 2020-07-14 NOTE — Telephone Encounter (Signed)
Noted! Thank you

## 2020-07-14 NOTE — Telephone Encounter (Signed)
Pamala Hurry from [facility] [phone] called and said they can't get patient in for stat thyroid biopsy before Monday.  Please advise.  Thank you!

## 2020-07-17 ENCOUNTER — Other Ambulatory Visit: Payer: Self-pay

## 2020-07-17 ENCOUNTER — Ambulatory Visit
Admission: RE | Admit: 2020-07-17 | Discharge: 2020-07-17 | Disposition: A | Discharge status: Home / Self Care | Payer: 59 | Source: Ambulatory Visit | Attending: Family Medicine | Admitting: Family Medicine

## 2020-07-17 DIAGNOSIS — E041 Nontoxic single thyroid nodule: Secondary | ICD-10-CM

## 2020-07-17 NOTE — Discharge Instructions (Signed)
Thyroid Needle Biopsy, Care After This sheet gives you information about how to care for yourself after your procedure. Your health care provider may also give you more specific instructions. If you have problems or questions, contact your health care provider. What can I expect after the procedure? After the procedure, it is common to have:  Soreness and tenderness that lasts for a few days.  Bruising where the needle was inserted (puncture site). Follow these instructions at home:   Take over-the-counter and prescription medicines only as told by your health care provider.  To help ease discomfort, keep your head raised (elevated) when you are lying down. When you move from lying down to sitting up, use both hands to support the back of your head and neck.  Check your puncture site every day for signs of infection. Check for: ? Redness, swelling, or pain. ? Fluid or blood. ? Warmth. ? Pus or a bad smell.  Return to your normal activities as told by your health care provider. Ask your health care provider what activities are safe for you.  Keep all follow-up visits as told by your health care provider. This is important. Contact a health care provider if:  You have redness, swelling, or pain around your puncture site.  You have fluid or blood coming from your puncture site.  Your puncture site feels warm to the touch.  You have pus or a bad smell coming from your puncture site.  You have a fever. Get help right away if:  You have severe bleeding from the puncture site.  You have difficulty swallowing.  You have swollen glands (lymph nodes) in your neck. Summary  It is common to have some bruising and soreness where the needle was inserted in your lower front neck area (puncture site).  Check your puncture site every day for signs of infection, such as redness, swelling, or pain.  Get help right away if you have severe bleeding from your puncture site. This  information is not intended to replace advice given to you by your health care provider. Make sure you discuss any questions you have with your health care provider. Document Revised: 09/19/2017 Document Reviewed: 07/21/2017 Elsevier Patient Education  2020 Elsevier Inc.  

## 2020-07-19 LAB — CYTOLOGY - NON PAP

## 2020-07-23 ENCOUNTER — Telehealth: Payer: Self-pay | Admitting: Family Medicine

## 2020-07-23 DIAGNOSIS — E041 Nontoxic single thyroid nodule: Secondary | ICD-10-CM

## 2020-07-23 NOTE — Telephone Encounter (Signed)
Jorge Boyle - plz call - thyroid biopsy returned reassuringly benign. We can review further plan at CPE if ongoing symptoms.  Jorge Boyle - can you check into why I didn't receive results but had to go searching for them? Biopsy results returned last week but I was not notified even though I ordered - had to go searching for them.

## 2020-07-24 ENCOUNTER — Encounter: Payer: Self-pay | Admitting: Family Medicine

## 2020-07-24 NOTE — Telephone Encounter (Signed)
Spoke with pt relaying Dr. G's message. Pt verbalizes understanding.  

## 2020-07-24 NOTE — Telephone Encounter (Signed)
From what I could tell, results went to the NP that did the biopsy and submitted the specimen.

## 2020-07-25 NOTE — Telephone Encounter (Signed)
Will forward to [facility] to look into this.

## 2020-07-28 NOTE — Telephone Encounter (Signed)
Spoke with Randall An, RN Team Lead she is going to assist me with the investigation on this as I will be out of the office.   Thanks.

## 2020-07-31 NOTE — Telephone Encounter (Signed)
Contacted [place] department at [facility] and their supervisor is out and they advised to call back tomorrow and speak to [facility].

## 2020-08-03 NOTE — Telephone Encounter (Signed)
Contacted Kim at [place] Department at [facility] at [phone]. Looked at pathology report and do not see any breakdown in order. Both the ordering physician and radiologist performing needle biopsy receive results of the pathology report. Kim sent a msg to the radiologist concerning the results and she will f/u as soon as she is available. Kim advised this may be on [facility] side with the pathology reporting. Phone number for [facility] is [phone].  Kim will f/u with Rushie Nyhan, NP, radiologist that performed the needle biopsy and let this office know. Advised this nurse will f/u with [facility] and f/u with her.   Contacted [facility] and spoke to [place] who reports the account for [facility] is with [facility] so the results are sent to [facility] only. Advised the results are always sent to the PCP and not the account holder for [facility]. Asked to speak to manager and she transferred phone to managers VM.  LVM for manager to return call.

## 2020-08-17 ENCOUNTER — Other Ambulatory Visit: Payer: Self-pay | Admitting: Family Medicine

## 2020-08-17 DIAGNOSIS — M1A00X0 Idiopathic chronic gout, unspecified site, without tophus (tophi): Secondary | ICD-10-CM

## 2020-08-17 DIAGNOSIS — E785 Hyperlipidemia, unspecified: Secondary | ICD-10-CM

## 2020-08-17 DIAGNOSIS — IMO0002 Reserved for concepts with insufficient information to code with codable children: Secondary | ICD-10-CM

## 2020-08-17 DIAGNOSIS — E1140 Type 2 diabetes mellitus with diabetic neuropathy, unspecified: Secondary | ICD-10-CM

## 2020-08-18 ENCOUNTER — Other Ambulatory Visit (INDEPENDENT_AMBULATORY_CARE_PROVIDER_SITE_OTHER): Payer: 59

## 2020-08-18 ENCOUNTER — Other Ambulatory Visit: Payer: Self-pay

## 2020-08-18 DIAGNOSIS — E785 Hyperlipidemia, unspecified: Secondary | ICD-10-CM | POA: Diagnosis not present

## 2020-08-18 DIAGNOSIS — M1A00X0 Idiopathic chronic gout, unspecified site, without tophus (tophi): Secondary | ICD-10-CM | POA: Diagnosis not present

## 2020-08-18 LAB — LIPID PANEL
Cholesterol: 119 mg/dL (ref 0–200)
HDL: 29.1 mg/dL — ABNORMAL LOW (ref >39.00)
LDL Cholesterol: 69 mg/dL (ref 0–99)
NonHDL: 90.37
Total CHOL/HDL Ratio: 4
Triglycerides: 106 mg/dL (ref 0.0–149.0)
VLDL: 21.2 mg/dL (ref 0.0–40.0)

## 2020-08-18 LAB — COMPREHENSIVE METABOLIC PANEL
ALT: 37 U/L (ref 0–53)
AST: 19 U/L (ref 0–37)
Albumin: 4.1 g/dL (ref 3.5–5.2)
Alkaline Phosphatase: 56 U/L (ref 39–117)
BUN: 10 mg/dL (ref 6–23)
CO2: 27 mEq/L (ref 19–32)
Calcium: 9.2 mg/dL (ref 8.4–10.5)
Chloride: 100 mEq/L (ref 96–112)
Creatinine, Ser: 0.87 mg/dL (ref 0.40–1.50)
GFR: 104.66 mL/min (ref >60.00)
Glucose, Bld: 151 mg/dL — ABNORMAL HIGH (ref 70–99)
Potassium: 4 mEq/L (ref 3.5–5.1)
Sodium: 137 mEq/L (ref 135–145)
Total Bilirubin: 0.5 mg/dL (ref 0.2–1.2)
Total Protein: 7.4 g/dL (ref 6.0–8.3)

## 2020-08-18 LAB — URIC ACID: Uric Acid, Serum: 7.2 mg/dL (ref 4.0–7.8)

## 2020-08-25 ENCOUNTER — Other Ambulatory Visit: Payer: Self-pay

## 2020-08-25 ENCOUNTER — Ambulatory Visit (INDEPENDENT_AMBULATORY_CARE_PROVIDER_SITE_OTHER): Payer: 59 | Admitting: Family Medicine

## 2020-08-25 ENCOUNTER — Encounter: Payer: Self-pay | Admitting: Family Medicine

## 2020-08-25 VITALS — BP 118/76 | HR 95 | Temp 98.0°F | Ht 69.75 in | Wt 300.0 lb

## 2020-08-25 DIAGNOSIS — M542 Cervicalgia: Secondary | ICD-10-CM

## 2020-08-25 DIAGNOSIS — Z0001 Encounter for general adult medical examination with abnormal findings: Secondary | ICD-10-CM

## 2020-08-25 DIAGNOSIS — I10 Essential (primary) hypertension: Secondary | ICD-10-CM

## 2020-08-25 DIAGNOSIS — M1A00X0 Idiopathic chronic gout, unspecified site, without tophus (tophi): Secondary | ICD-10-CM

## 2020-08-25 DIAGNOSIS — Z23 Encounter for immunization: Secondary | ICD-10-CM

## 2020-08-25 DIAGNOSIS — E041 Nontoxic single thyroid nodule: Secondary | ICD-10-CM | POA: Diagnosis not present

## 2020-08-25 DIAGNOSIS — IMO0002 Reserved for concepts with insufficient information to code with codable children: Secondary | ICD-10-CM

## 2020-08-25 DIAGNOSIS — E1165 Type 2 diabetes mellitus with hyperglycemia: Secondary | ICD-10-CM

## 2020-08-25 DIAGNOSIS — E785 Hyperlipidemia, unspecified: Secondary | ICD-10-CM

## 2020-08-25 DIAGNOSIS — E1140 Type 2 diabetes mellitus with diabetic neuropathy, unspecified: Secondary | ICD-10-CM

## 2020-08-25 NOTE — Patient Instructions (Addendum)
Flu shot today  We will refer you to surgeon for further evaluation of thyroid.  May take naprosyn for neck. May use topical voltaren gel over the counter which is also an anti inflammatory. May try muscle relaxant as well.  Return in 2 months for diabetes check.   Health Maintenance, Male Adopting a healthy lifestyle and getting preventive care are important in promoting health and wellness. Ask your health care provider about:  The right schedule for you to have regular tests and exams.  Things you can do on your own to prevent diseases and keep yourself healthy. What should I know about diet, weight, and exercise? Eat a healthy diet   Eat a diet that includes plenty of vegetables, fruits, low-fat dairy products, and lean protein.  Do not eat a lot of foods that are high in solid fats, added sugars, or sodium. Maintain a healthy weight Body mass index (BMI) is a measurement that can be used to identify possible weight problems. It estimates body fat based on height and weight. Your health care provider can help determine your BMI and help you achieve or maintain a healthy weight. Get regular exercise Get regular exercise. This is one of the most important things you can do for your health. Most adults should:  Exercise for at least 150 minutes each week. The exercise should increase your heart rate and make you sweat (moderate-intensity exercise).  Do strengthening exercises at least twice a week. This is in addition to the moderate-intensity exercise.  Spend less time sitting. Even light physical activity can be beneficial. Watch cholesterol and blood lipids Have your blood tested for lipids and cholesterol at 45 years of age, then have this test every 5 years. You may need to have your cholesterol levels checked more often if:  Your lipid or cholesterol levels are high.  You are older than 45 years of age.  You are at high risk for heart disease. What should I know about  cancer screening? Many types of cancers can be detected early and may often be prevented. Depending on your health history and family history, you may need to have cancer screening at various ages. This may include screening for:  Colorectal cancer.  Prostate cancer.  Skin cancer.  Lung cancer. What should I know about heart disease, diabetes, and high blood pressure? Blood pressure and heart disease  High blood pressure causes heart disease and increases the risk of stroke. This is more likely to develop in people who have high blood pressure readings, are of African descent, or are overweight.  Talk with your health care provider about your target blood pressure readings.  Have your blood pressure checked: ? Every 3-5 years if you are 52-7 years of age. ? Every year if you are 54 years old or older.  If you are between the ages of 34 and 34 and are a current or former smoker, ask your health care provider if you should have a one-time screening for abdominal aortic aneurysm (AAA). Diabetes Have regular diabetes screenings. This checks your fasting blood sugar level. Have the screening done:  Once every three years after age 87 if you are at a normal weight and have a low risk for diabetes.  More often and at a younger age if you are overweight or have a high risk for diabetes. What should I know about preventing infection? Hepatitis B If you have a higher risk for hepatitis B, you should be screened for this virus. Talk  with your health care provider to find out if you are at risk for hepatitis B infection. Hepatitis C Blood testing is recommended for:  Everyone born from [birth year] through 1966.  Anyone with known risk factors for hepatitis C. Sexually transmitted infections (STIs)  You should be screened each year for STIs, including gonorrhea and chlamydia, if: ? You are sexually active and are younger than 45 years of age. ? You are older than 45 years of age and your health  care provider tells you that you are at risk for this type of infection. ? Your sexual activity has changed since you were last screened, and you are at increased risk for chlamydia or gonorrhea. Ask your health care provider if you are at risk.  Ask your health care provider about whether you are at high risk for HIV. Your health care provider may recommend a prescription medicine to help prevent HIV infection. If you choose to take medicine to prevent HIV, you should first get tested for HIV. You should then be tested every 3 months for as long as you are taking the medicine. Follow these instructions at home: Lifestyle  Do not use any products that contain nicotine or tobacco, such as cigarettes, e-cigarettes, and chewing tobacco. If you need help quitting, ask your health care provider.  Do not use street drugs.  Do not share needles.  Ask your health care provider for help if you need support or information about quitting drugs. Alcohol use  Do not drink alcohol if your health care provider tells you not to drink.  If you drink alcohol: ? Limit how much you have to 0-2 drinks a day. ? Be aware of how much alcohol is in your drink. In the [place] one drink equals one 12 oz bottle of beer (355 mL), one 5 oz glass of wine (148 mL), or one 1 oz glass of hard liquor (44 mL). General instructions  Schedule regular health, dental, and eye exams.  Stay current with your vaccines.  Tell your health care provider if: ? You often feel depressed. ? You have ever been abused or do not feel safe at home. Summary  Adopting a healthy lifestyle and getting preventive care are important in promoting health and wellness.  Follow your health care provider's instructions about healthy diet, exercising, and getting tested or screened for diseases.  Follow your health care provider's instructions on monitoring your cholesterol and blood pressure. This information is not intended to replace advice  given to you by your health care provider. Make sure you discuss any questions you have with your health care provider. Document Revised: 09/30/2018 Document Reviewed: 09/30/2018 Elsevier Patient Education  2020 [employer].

## 2020-08-25 NOTE — Assessment & Plan Note (Signed)
Discomfort to R neck which seems muscular leading to TTH. Discussed conservative management with heating pad, naprosyn, muscle relaxant he has at home.

## 2020-08-25 NOTE — Assessment & Plan Note (Signed)
Chronic, stable. Continue current regimen of losartan.

## 2020-08-25 NOTE — Assessment & Plan Note (Signed)
Chronic, improved with addition of atorvastatin - continue. The ASCVD Risk score Mikey Bussing DC Jr., et al., 2013) failed to calculate for the following reasons:   The valid total cholesterol range is 130 to 320 mg/dL

## 2020-08-25 NOTE — Assessment & Plan Note (Signed)
Preventative protocols reviewed and updated unless pt declined. Discussed healthy diet and lifestyle.  

## 2020-08-25 NOTE — Assessment & Plan Note (Addendum)
Recent A1c elevated at 7.9% (06/2020).  Will return in 2 months for DM f/u.  Continue metformin 1000mg  bid amaryl 2mg  and ozempic 0.5mg  weekly.

## 2020-08-25 NOTE — Assessment & Plan Note (Addendum)
Urate level rising. No recent gout flare. Reviewed diet choices to avoid gout flare. Continue PRN colchicine.

## 2020-08-25 NOTE — Assessment & Plan Note (Signed)
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 He desires further treatment /evaluation so will refer for gen surg consult.

## 2020-08-25 NOTE — Progress Notes (Signed)
This visit was conducted in person.  BP 118/76 (BP Location: Left Arm, Patient Position: Sitting, Cuff Size: Large)   Pulse 95   Temp 98 F (36.7 C)   Ht 5' 9.75" (1.772 m)   Wt 300 lb (136.1 kg)   SpO2 96%   BMI 43.35 kg/m    CC: CPE Subjective:    Patient ID: Jorge Boyle, male    DOB: 07/27/1976, 44 y.o.   MRN: [id number]  HPI: Jorge Boyle is a 44 y.o. male presenting on 08/25/2020 for Annual Exam   S/p R hemithyroidectomy 2005 (R side and isthmus)   Latest thyroid US 06/2020 - 3x2.4x1.6cm L inferior nodule, biopsy showing benign follicular epithelium with hurthle cell changes and cyst contents. He underwent 9 FNA biopsies. I did not receive biopsy results but rather had to go searching for them.   He continues feeling neck fullness as well as choking sensation. No dysphagia. Wants definitive management.   Notes increasing R neck pain for 2 weeks - leads to tension headache. Managing with excedrin migraine and heating pad with some benefit.   Preventative: Flu - yearly Tdap 12/2011  COVID vaccine [place] 01/2020, 02/2020  Pneumovax 2016  Seat belt use discussed.  Sunscreen use discussed. Denies changing moles on skin. 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 Interim medical history since our last visit reviewed. Allergies and medications reviewed and updated. Outpatient Medications Prior to Visit  Medication Sig Dispense Refill  . atorvastatin (LIPITOR) 20 MG tablet TAKE 1 TABLET BY MOUTH  DAILY 90 tablet 0  . Blood Glucose Monitoring Suppl (CONTOUR NEXT  MONITOR) w/Device KIT Use as instructed to check blood sugar once daily. 1 kit 0  . COLCRYS 0.6 MG tablet TAKE 1 TABLET (0.6 MG TOTAL) BY MOUTH DAILY. MAY TAKE 2 TABLETS ON THE FIRST DAY OF USE 90 tablet 0  . glimepiride (AMARYL) 2 MG tablet TAKE 1 TABLET BY MOUTH  DAILY WITH BREAKFAST 90 tablet 3  . glucose blood (CONTOUR NEXT TEST) test strip Use as instructed to check blood sugar once daily. 100 each 3  . HYDROcodone-acetaminophen (NORCO/VICODIN) 5-325 MG tablet Take one tablet at night for pain; may take up to every 6 hours as needed for pain if not working or driving    . losartan (COZAAR) 100 MG tablet TAKE 1 TABLET BY MOUTH  DAILY 90 tablet 0  . metFORMIN (GLUCOPHAGE) 1000 MG tablet TAKE 1 TABLET BY MOUTH  TWICE DAILY WITH A MEAL 180 tablet 0  . Microlet Lancets MISC Use as directed to check blood sugars once daily. 100 each 3  . naproxen (NAPROSYN) 500 MG tablet Take 1 tablet (500 mg total) by mouth 2 (two) times daily as needed for moderate pain. 180 tablet 1  . orphenadrine (NORFLEX) 100 MG tablet TAKE 1 TABLET (100 MG TOTAL) BY MOUTH 2 (TWO) TIMES DAILY AS NEEDED FOR MUSCLE SPASMS. 40 tablet 0  . Semaglutide,0.25 or 0.5MG/DOS, (OZEMPIC, 0.25 OR 0.5 MG/DOSE,) 2 MG/1.5ML SOPN Inject 0.5 mg into the skin once a week. 6 pen 3   No facility-administered  medications prior to visit.     Per HPI unless specifically indicated in ROS section below Review of Systems  Constitutional: Negative for activity change, appetite change, chills, fatigue, fever and unexpected weight change.  HENT: Negative for hearing loss.   Eyes: Negative for visual disturbance.  Respiratory: Negative for cough, chest tightness, shortness of breath and wheezing.   Cardiovascular: Negative for chest pain, palpitations and leg swelling.  Gastrointestinal: Negative for abdominal distention, abdominal pain, blood in stool, constipation, diarrhea, nausea and vomiting.  Genitourinary: Negative for difficulty urinating and  hematuria.  Musculoskeletal: Negative for arthralgias, myalgias and neck pain.  Skin: Negative for rash.  Neurological: Positive for headaches. Negative for dizziness, seizures and syncope.  Hematological: Negative for adenopathy. Does not bruise/bleed easily.  Psychiatric/Behavioral: Negative for dysphoric mood. The patient is not nervous/anxious.    Objective:  BP 118/76 (BP Location: Left Arm, Patient Position: Sitting, Cuff Size: Large)   Pulse 95   Temp 98 F (36.7 C)   Ht 5' 9.75" (1.772 m)   Wt 300 lb (136.1 kg)   SpO2 96%   BMI 43.35 kg/m   Wt Readings from Last 3 Encounters:  08/25/20 300 lb (136.1 kg)  07/07/20 297 lb 5 oz (134.9 kg)  01/14/20 (!) 301 lb (136.5 kg)      Physical Exam Vitals and nursing note reviewed.  Constitutional:      General: He is not in acute distress.    Appearance: Normal appearance. He is well-developed. He is not ill-appearing.  HENT:     Head: Normocephalic and atraumatic.     Right Ear: Hearing, tympanic membrane, ear canal and external ear normal.     Left Ear: Hearing, tympanic membrane, ear canal and external ear normal.  Eyes:     General: No scleral icterus.    Extraocular Movements: Extraocular movements intact.     Conjunctiva/sclera: Conjunctivae normal.     Pupils: Pupils are equal, round, and reactive to light.  Neck:     Thyroid: Thyroid tenderness (mild) present. No thyromegaly.      Comments: Decreasing fullness to L inferior thyroid after biopsy Cardiovascular:     Rate and Rhythm: Normal rate and regular rhythm.     Pulses: Normal pulses.          Radial pulses are 2+ on the right side and 2+ on the left side.     Heart sounds: Normal heart sounds. No murmur heard.   Pulmonary:     Effort: Pulmonary effort is normal. No respiratory distress.     Breath sounds: Normal breath sounds. No wheezing, rhonchi or rales.  Abdominal:     General: Abdomen is flat. Bowel sounds are normal. There is no distension.      Palpations: Abdomen is soft. There is no mass.     Tenderness: There is no abdominal tenderness. There is no guarding or rebound.     Hernia: No hernia is present.  Musculoskeletal:        General: Normal range of motion.     Cervical back: Normal range of motion and neck supple.     Right lower leg: No edema.     Left lower leg: No edema.     Comments: No midline cervical spine tenderness or significant paracervical mm tenderness, no pain at occiput   Lymphadenopathy:     Cervical: No cervical adenopathy.  Skin:    General: Skin is warm and dry.     Findings: No rash.  Neurological:  General: No focal deficit present.     Mental Status: He is alert and oriented to person, place, and time.     Comments: CN grossly intact, station and gait intact  Psychiatric:        Mood and Affect: Mood normal.        Behavior: Behavior normal.        Thought Content: Thought content normal.        Judgment: Judgment normal.       Results for orders placed or performed in visit on 08/18/20  Uric acid  Result Value Ref Range   Uric Acid, Serum 7.2 4.0 - 7.8 mg/dL  Comprehensive metabolic panel  Result Value Ref Range   Sodium 137 135 - 145 mEq/L   Potassium 4.0 3.5 - 5.1 mEq/L   Chloride 100 96 - 112 mEq/L   CO2 27 19 - 32 mEq/L   Glucose, Bld 151 (H) 70 - 99 mg/dL   BUN 10 6 - 23 mg/dL   Creatinine, Ser 0.87 0.40 - 1.50 mg/dL   Total Bilirubin 0.5 0.2 - 1.2 mg/dL   Alkaline Phosphatase 56 39 - 117 U/L   AST 19 0 - 37 U/L   ALT 37 0 - 53 U/L   Total Protein 7.4 6.0 - 8.3 g/dL   Albumin 4.1 3.5 - 5.2 g/dL   GFR 104.66 >60.00 mL/min   Calcium 9.2 8.4 - 10.5 mg/dL  Lipid panel  Result Value Ref Range   Cholesterol 119 0 - 200 mg/dL   Triglycerides 106.0 0 - 149 mg/dL   HDL 29.10 (L) >39.00 mg/dL   VLDL 21.2 0.0 - 40.0 mg/dL   LDL Cholesterol 69 0 - 99 mg/dL   Total CHOL/HDL Ratio 4    NonHDL 90.37    Lab Results  Component Value Date   TSH 1.67 07/07/2020   T3TOTAL 70.8 (L)  01/29/2013   T4TOTAL 6.1 02/17/2006   Lab Results  Component Value Date   HGBA1C 7.9 (H) 07/07/2020    Assessment & Plan:  This visit occurred during the SARS-CoV-2 public health emergency.  Safety protocols were in place, including screening questions prior to the visit, additional usage of staff PPE, and extensive cleaning of exam room while observing appropriate contact time as indicated for disinfecting solutions.   Problem List Items Addressed This Visit    Type 2 diabetes mellitus, uncontrolled, with neuropathy ([place])    Recent A1c elevated at 7.9% (06/2020).  Will return in 2 months for DM f/u.  Continue metformin 1077m bid amaryl 261mand ozempic 0.25m82meekly.       Thyroid nodule    S/p benign biopsy (benign follicular epithelium with hurthle cell changes and cyst contents) with interval decrease in size of thyroid mass however Hersel endorses ongoing neck discomfort, fullness, choking sensation. He desires further treatment /evaluation so will refer for gen surg consult.       Relevant Orders   Ambulatory referral to General Surgery   Obesity, morbid, BMI 40.0-49.9 ([facility]  Encouraged healthy diet and lifestyle changes to affect sustainable weight loss.       Neck pain on right side    Discomfort to R neck which seems muscular leading to TTH. Discussed conservative management with heating pad, naprosyn, muscle relaxant he has at home.       Gout    Urate level rising. No recent gout flare. Reviewed diet choices to avoid gout flare. Continue PRN colchicine.  Essential hypertension    Chronic, stable. Continue current regimen of losartan.       Encounter for general adult medical examination with abnormal findings - Primary    Preventative protocols reviewed and updated unless pt declined. Discussed healthy diet and lifestyle.       Dyslipidemia    Chronic, improved with addition of atorvastatin - continue. The ASCVD Risk score Mikey Bussing DC Jr., et al., 2013)  failed to calculate for the following reasons:   The valid total cholesterol range is 130 to 320 mg/dL        Other Visit Diagnoses    Need for influenza vaccination       Relevant Orders   Flu Vaccine QUAD 6+ mos PF IM (Fluarix Quad PF) (Completed)       No orders of the defined types were placed in this encounter.  Orders Placed This Encounter  Procedures  . Flu Vaccine QUAD 6+ mos PF IM (Fluarix Quad PF)  . Ambulatory referral to General Surgery    Referral Priority:   Routine    Referral Type:   Surgical    Referral Reason:   Specialty Services Required    Requested Specialty:   General Surgery    Number of Visits Requested:   1    Patient instructions: Flu shot today  We will refer you to surgeon for further evaluation of thyroid.  May take naprosyn for neck. May use topical voltaren gel over the counter which is also an anti inflammatory. May try muscle relaxant as well.  Return in 2 months for diabetes check.   Follow up plan: Return in about 2 months (around 10/25/2020) for follow up visit.  Ria Bush, MD

## 2020-08-25 NOTE — Assessment & Plan Note (Signed)
Encouraged healthy diet and lifestyle changes to affect sustainable weight loss.  

## 2020-09-05 ENCOUNTER — Ambulatory Visit: Payer: 59 | Admitting: General Surgery

## 2020-09-05 ENCOUNTER — Encounter: Payer: Self-pay | Admitting: General Surgery

## 2020-09-05 ENCOUNTER — Other Ambulatory Visit: Payer: Self-pay

## 2020-09-05 VITALS — BP 134/88 | HR 79 | Temp 98.2°F | Ht 70.0 in | Wt 305.0 lb

## 2020-09-05 DIAGNOSIS — E041 Nontoxic single thyroid nodule: Secondary | ICD-10-CM

## 2020-09-05 NOTE — Progress Notes (Signed)
Patient ID: Jorge Boyle, male   DOB: 02/25/1976, 43 y.o.   MRN: [id number]  Chief Complaint  Patient presents with  . New Patient (Initial Visit)    Thyroid nodule    HPI Jorge Boyle is a 43 y.o. male.   He has been referred by his primary care provider, Dr. Javier Gutierrez, for further evaluation of a left thyroid nodule.  He has a prior history of right thyroid lobectomy around 2005.  He reports that at that time, he was told that it was possible that the other side could enlarge and he is not sure why they did not remove the entire gland at that time.  He says that he began noticing that the left side of his neck would swell and has begun to cause him frequent throat clearing as well as a pressure sensation in his neck while supine.  He underwent an ultrasound that demonstrated a TI- RADS 5 lesion in the left lobe of the thyroid.  This was subsequently biopsied with benign results.  Nonetheless, he continues to endorse compressive symptoms and is interested in definitive management.  He denies any dysphagia or voice changes.  No heat or cold intolerance.  No heart palpitations, hand tremors, or feeling jittery or anxious.  He denies any changes in the texture of his hair, skin, or fingernails.  No diarrhea or constipation.  No significant fatigue.  No unexplained weight loss or weight gain.  He denies any ocular symptoms as well as any therapeutic or occupational exposure to head or neck irradiation.  He does report that both parents had "thyroid problems" for which they took medication.  He does not currently take any thyroid hormone replacement.   Past Medical History:  Diagnosis Date  . Allergic rhinitis   . Diabetes mellitus without complication ([facility])   . Difficult intubation   . Headache   . History of chicken pox   . History of kidney stones   . Multiple thyroid nodules 01/2012   left, biopsy x2 benign, rpt US stable (01/2013, 07/2015) rec rpt 1 yr  . Nontoxic nodular goiter    . Obesity   . Prediabetes   . S/P excision of thyroid adenoma 2005   R thyroidectomy    Past Surgical History:  Procedure Laterality Date  . CYSTOSCOPY/URETEROSCOPY/HOLMIUM LASER/STENT PLACEMENT Left 02/02/2018   Procedure: CYSTOSCOPY/URETEROSCOPY/HOLMIUM LASER/STENT PLACEMENT;  Surgeon: Brandon, Ashley, MD;  Location: [facility];  Service: Urology;  Laterality: Left;  . THYROIDECTOMY, PARTIAL  2005   Right and isthmus. Dr Madison Clark  . TONSILLECTOMY AND ADENOIDECTOMY  1985  . UMBILICAL HERNIA REPAIR  12/30/2013   Byrnett  . VENTRAL HERNIA REPAIR N/A 02/02/2018   Procedure: LAPAROSCOPIC VENTRAL HERNIA;  Surgeon: Byrnett, Jeffrey W, MD;  Location: [facility];  Service: General;  Laterality: N/A;    Family History  Problem Relation Age of Onset  . Diabetes Paternal Grandmother   . Cancer Paternal Grandmother 41       breast  . Diabetes Mother   . Thyroid disease Mother   . Diabetes Father   . Thyroid disease Father   . Stroke Maternal Grandmother   . Diabetes Maternal Grandfather   . Cancer Other        stomach, lung (smoker)  . Coronary artery disease Neg Hx     Social History Social History   Tobacco Use  . Smoking status: Never Smoker  . Smokeless tobacco: Never Used  Vaping Use  . Vaping Use: Never   used  Substance Use Topics  . Alcohol use: Yes    Alcohol/week: 0.0 standard drinks    Comment: rare  . Drug use: No    Allergies  Allergen Reactions  . Penicillins Rash    Has patient had a PCN reaction causing immediate rash, facial/tongue/throat swelling, SOB or lightheadedness with hypotension: Yes Has patient had a PCN reaction causing severe rash involving mucus membranes or skin necrosis: No Has patient had a PCN reaction that required hospitalization: Unknown Has patient had a PCN reaction occurring within the last 10 years: No If all of the above answers are "NO", then may proceed with Cephalosporin use.   . Lisinopril     Throat irritation, dry cough     Current Outpatient Medications  Medication Sig Dispense Refill  . atorvastatin (LIPITOR) 20 MG tablet TAKE 1 TABLET BY MOUTH  DAILY 90 tablet 0  . Blood Glucose Monitoring Suppl (CONTOUR NEXT MONITOR) w/Device KIT Use as instructed to check blood sugar once daily. 1 kit 0  . COLCRYS 0.6 MG tablet TAKE 1 TABLET (0.6 MG TOTAL) BY MOUTH DAILY. MAY TAKE 2 TABLETS ON THE FIRST DAY OF USE 90 tablet 0  . glimepiride (AMARYL) 2 MG tablet TAKE 1 TABLET BY MOUTH  DAILY WITH BREAKFAST 90 tablet 3  . glucose blood (CONTOUR NEXT TEST) test strip Use as instructed to check blood sugar once daily. 100 each 3  . HYDROcodone-acetaminophen (NORCO/VICODIN) 5-325 MG tablet Take one tablet at night for pain; may take up to every 6 hours as needed for pain if not working or driving    . losartan (COZAAR) 100 MG tablet TAKE 1 TABLET BY MOUTH  DAILY 90 tablet 0  . metFORMIN (GLUCOPHAGE) 1000 MG tablet TAKE 1 TABLET BY MOUTH  TWICE DAILY WITH A MEAL 180 tablet 0  . Microlet Lancets MISC Use as directed to check blood sugars once daily. 100 each 3  . naproxen (NAPROSYN) 500 MG tablet Take 1 tablet (500 mg total) by mouth 2 (two) times daily as needed for moderate pain. 180 tablet 1  . orphenadrine (NORFLEX) 100 MG tablet TAKE 1 TABLET (100 MG TOTAL) BY MOUTH 2 (TWO) TIMES DAILY AS NEEDED FOR MUSCLE SPASMS. 40 tablet 0  . Semaglutide,0.25 or 0.5MG/DOS, (OZEMPIC, 0.25 OR 0.5 MG/DOSE,) 2 MG/1.5ML SOPN Inject 0.5 mg into the skin once a week. 6 pen 3   No current facility-administered medications for this visit.    Review of Systems Review of Systems  Neurological: Positive for headaches.  All other systems reviewed and are negative. Or as discussed in the history of present illness.  Blood pressure 134/88, pulse 79, temperature 98.2 F (36.8 C), height 5' 10" (1.778 m), weight (!) 305 lb (138.3 kg), SpO2 97 %.  Physical Exam Physical Exam Constitutional:      General: He is not in acute distress.     Appearance: He is obese.  HENT:     Head: Normocephalic and atraumatic.     Nose:     Comments: Covered with a mask    Mouth/Throat:     Comments: Covered with a mask Eyes:     General: No scleral icterus.       Right eye: No discharge.        Left eye: No discharge.     Comments: No proptosis or exophthalmos.  No lid lag or stare.  Neck:     Comments: He has a well-healed and faded transverse thyroidectomy scar.  The   right lobe of the thyroid is surgically absent.  The left lobe is enlarged, but secondary to his body habitus, I am not able to appreciate a discrete nodule.  The gland does move freely with deglutition.  Again, secondary to his obesity, I am unable to palpate any cervical or supraclavicular lymphadenopathy. Cardiovascular:     Rate and Rhythm: Normal rate and regular rhythm.  Pulmonary:     Effort: Pulmonary effort is normal.     Breath sounds: Normal breath sounds.  Abdominal:     General: Bowel sounds are normal.     Palpations: Abdomen is soft.     Comments: Protuberant, consistent with his level of obesity.  Genitourinary:    Comments: Deferred Musculoskeletal:     Comments: He has trace nonpitting ankle edema.  Skin:    General: Skin is warm and dry.  Neurological:     General: No focal deficit present.     Mental Status: He is alert and oriented to person, place, and time.  Psychiatric:        Mood and Affect: Mood normal.        Behavior: Behavior normal.     Data Reviewed His most recent TSH was 1.67 on July 07, 2020.  This is within normal range.  I reviewed the images from his head and neck ultrasound performed on September 24.  I concur with the radiology impression which is copied here:  CLINICAL DATA:  Palpable abnormality. 43-year-old male with history of neck mass, prior right hemithyroidectomy.  EXAM: THYROID ULTRASOUND  TECHNIQUE: Ultrasound examination of the thyroid gland and adjacent soft tissues was  performed.  COMPARISON:  07/28/2015  FINDINGS: Parenchymal Echotexture: Mildly heterogenous  Isthmus: 0.8 cm  Right lobe: Surgically absent. No soft tissue masses in the surgical bed.  Left lobe: 5.1 x 2.0 x 2.5 cm, previously 6.2 x 2.1 x 2.7 cm  _________________________________________________________  Estimated total number of nodules >/= 1 cm: 1  Number of spongiform nodules >/=  2 cm not described below (TR1): 0  Number of mixed cystic and solid nodules >/= 1.5 cm not described below (TR2): 0  _________________________________________________________  Nodule # 1:  Prior biopsy: No  Location: Left; Inferior  Maximum size: 3.0 cm; Other 2 dimensions: 2.4 x 1.6 cm, previously, 2.7 x 2.3 x 1.9 cm  Composition: cannot determine (2)  Echogenicity: very hypoechoic (3)  Shape: taller-than-wide (3)  Margins: lobulated/irregular (2)  Echogenic foci: peripheral calcifications (2)  ACR TI-RADS total points: 12.  ACR TI-RADS risk category:  TR5 (>/= 7 points).  Significant change in size (>/= 20% in two dimensions and minimal increase of 2 mm): No  Change in features: Yes  Change in ACR TI-RADS risk category: Yes  ACR TI-RADS recommendations:  **Given size (>/= 1.0 cm) and appearance, fine needle aspiration of this highly suspicious nodule should be considered based on TI-RADS criteria.  _________________________________________________________  IMPRESSION: 1. Left lower pole thyroid nodule measuring up to 3.0 cm with sonographic features highly suspicious for malignancy. Recommend tissue sampling. 2. Postsurgical changes after right hemithyroidectomy without evidence of soft tissue mass in the surgical bed.  I reviewed the pathology from his fine-needle aspiration biopsy performed on July 17, 2020.  It is copied here: CYTOLOGY - NON PAP  CASE: [id number]  PATIENT: Jorge Boyle  Non-Gynecological Cytology Report       Specimen Submitted:  A. Thyroid, left lower pole; FNA   Clinical History: None provided       DIAGNOSIS:    A. THYROID, LEFT LOWER POLE/ ULTRASOUND-GUIDED FINE NEEDLE ASPIRATION:  - BENIGN (BETHESDA CATEGORY II).  - BENIGN FOLLICULAR EPITHELIUM WITH HURTHLE CELL CHANGES AND CYST  CONTENTS.   Assessment This is a 43-year-old man who is status post prior right hemithyroidectomy who now has a symptomatic left thyroid nodule.  Biopsy was benign, but the compressive symptoms are bothersome to him and he would like to proceed with completion thyroidectomy.  Plan The risks of thyroid surgery were discussed, including (but not limited to): bleeding, infection, damage to surrounding structures/tissues, injury (temporary or permanent) to the recurrent laryngeal nerve, hypoparathyroidism (temporary or permanent), need for thyroid hormone replacement therapy, need for additional surgery and/or treatment, recurrence of disease, tracheostomy (temporary or permanent).  The patient had the opportunity to ask any questions and these were answered to their satisfaction.     Kirstin Kugler 09/05/2020, 11:41 AM   

## 2020-09-05 NOTE — Patient Instructions (Signed)
We have discussed having a Thyroidectomy done at [facility] by Dr Celine Ahr. Please see your Blue surgery sheet. Our surgery scheduler will contact you to look at surgery dates and go over information.   Thyroidectomy A thyroidectomy is a surgery that is done to remove the thyroid gland. The thyroid is a butterfly-shaped gland that is located at the lower front of your neck. It produces thyroid hormone, which is a substance that helps to control certain body processes. You may have a:  Total thyroidectomy. All of your thyroid is removed.  Thyroid lobectomy. Part of your thyroid is removed. The amount of thyroid gland tissue that is removed during your surgery depends on the reason for the procedure. Reasons to have this procedure include treatment for:  Thyroid nodules.  Thyroid cancer.  Benign thyroid tumors.  Goiter.  Overactive thyroid gland (hyperthyroidism). There are two ways to do this procedure. Conventional, or open, thyroidectomy uses one large incision to remove the thyroid gland. This is the most common method. Endoscopic thyroidectomy, a less invasive method, uses a narrow tube with a light and camera (endoscope) to remove the gland. Tell a health care provider about:  Any allergies you have.  All medicines you are taking, including vitamins, herbs, eye drops, creams, and over-the-counter medicines.  Any problems you or family members have had with anesthetic medicines.  Any blood disorders you have.  Any surgeries you have had.  Any medical conditions you have.  Whether you are pregnant or may be pregnant. What are the risks? Generally, this is a safe procedure. However, problems may occur, including:  Damage to the parathyroid glands. These are located behind your thyroid gland. They maintain the calcium levels in the body. Damage may lead to: ? A decrease in parathyroid hormone levels (hypoparathyroidism). ? A decrease in calcium levels. This will make your nerves  irritable and may cause muscle spasms.  An increase in thyroid hormone.  Damage to the nerves of your voice box (larynx). This can be temporary or long-term (rare).  Hoarseness. This usually resolves in 24-48 hours.  Bleeding.  Infection. What happens before the procedure? Staying hydrated Follow instructions from your health care provider about hydration, which may include:  Up to 2 hours before the procedure - you may continue to drink clear liquids, such as water, clear fruit juice, black coffee, and plain tea. Eating and drinking restrictions Follow instructions from your health care provider about eating and drinking, which may include:  8 hours before the procedure - stop eating heavy meals or foods such as meat, fried foods, or fatty foods.  6 hours before the procedure - stop eating light meals or foods, such as toast or cereal.  6 hours before the procedure - stop drinking milk or drinks that contain milk.  2 hours before the procedure - stop drinking clear liquids. Medicines Ask your health care provider about:  Changing or stopping your regular medicines. This is especially important if you are taking diabetes medicines or blood thinners.  Taking medicines such as aspirin and ibuprofen. These medicines can thin your blood. Do not take these medicines unless your health care provider tells you to take them.  Taking over-the-counter medicines, vitamins, herbs, and supplements. General instructions  You may be asked to shower with a germ-killing soap.  Plan to have someone take you home from the hospital or clinic.  Plan to have a responsible adult care for you for at least 24 hours after you leave the hospital or clinic.  This is important. What happens during the procedure?  To reduce your risk of infection: ? Your health care team will wash or sanitize their hands. ? Hair may be removed from the surgical area. ? Your skin will be washed with soap.  An IV will  be inserted into one of your veins.  You will be given one or more of the following: ? A medicine to help you relax (sedative). ? A medicine to make you fall asleep (general anesthetic).  Your health care provider will perform your surgery using one of two methods: ? For open thyroidectomy, an incision will be made in your lower neck. Muscles in the area will be separated to reveal your thyroid gland. ? For endoscopic thyroidectomy, several small incisions will be made in your neck, chest, or armpit. An endoscopewill be inserted into an incision.  Your health care provider may monitor laryngeal nerve function during the procedure for safety reasons.  Part or all of your thyroid gland will be removed.  A tube (drain) may be placed at the incision site to drain blood and fluids that accumulate under the skin after the procedure. The drain may have to stay in place for a day or two after the procedure.  The incision will be closed with stitches (sutures).  A dressing will be placed over your incision. The procedure may vary among health care providers and hospitals. What happens after the procedure?  Your blood pressure, heart rate, breathing rate, and blood oxygen level will be monitored often until the medicines you were given have worn off.  You will be given pain medicine as needed.  Your provider will check your ability to talk and swallow after the procedure.  You will gradually start to drink liquids and have soft foods as tolerated.  You may have a blood test to check the level of calcium in your body.  If you had a drain put in during the procedure, it will usually be removed the next day. Summary  A thyroidectomy is a surgery that is done to remove the thyroid gland.  The procedure will be done in one of two ways: conventional, or open, thyroidectomy or endoscopic thyroidectomy.  Serious complications are rare.  Plan to have a responsible adult care for you for at  least 24 hours after you leave the hospital or clinic. This is important. This information is not intended to replace advice given to you by your health care provider. Make sure you discuss any questions you have with your health care provider. Document Revised: 09/19/2017 Document Reviewed: 08/12/2017 Elsevier Patient Education  2020 [employer].

## 2020-09-05 NOTE — H&P (View-Only) (Signed)
Patient ID: Jorge Boyle, male   DOB: September 13, 1976, 44 y.o.   MRN: [id number]  Chief Complaint  Patient presents with  . New Patient (Initial Visit)    Thyroid nodule    HPI Jorge Boyle is a 44 y.o. male.   He has been referred by his primary care provider, Dr. Ria Bush, for further evaluation of a left thyroid nodule.  He has a prior history of right thyroid lobectomy around 2005.  He reports that at that time, he was told that it was possible that the other side could enlarge and he is not sure why they did not remove the entire gland at that time.  He says that he began noticing that the left side of his neck would swell and has begun to cause him frequent throat clearing as well as a pressure sensation in his neck while supine.  He underwent an ultrasound that demonstrated a TI- RADS 5 lesion in the left lobe of the thyroid.  This was subsequently biopsied with benign results.  Nonetheless, he continues to endorse compressive symptoms and is interested in definitive management.  He denies any dysphagia or voice changes.  No heat or cold intolerance.  No heart palpitations, hand tremors, or feeling jittery or anxious.  He denies any changes in the texture of his hair, skin, or fingernails.  No diarrhea or constipation.  No significant fatigue.  No unexplained weight loss or weight gain.  He denies any ocular symptoms as well as any therapeutic or occupational exposure to head or neck irradiation.  He does report that both parents had "thyroid problems" for which they took medication.  He does not currently take any thyroid hormone replacement.   Past Medical History:  Diagnosis Date  . Allergic rhinitis   . Diabetes mellitus without complication ([place])   . Difficult intubation   . Headache   . History of chicken pox   . History of kidney stones   . Multiple thyroid nodules 01/2012   left, biopsy x2 benign, rpt [place] stable (01/2013, 07/2015) rec rpt 1 yr  . Nontoxic nodular goiter    . Obesity   . Prediabetes   . S/P excision of thyroid adenoma 2005   R thyroidectomy    Past Surgical History:  Procedure Laterality Date  . CYSTOSCOPY/URETEROSCOPY/HOLMIUM LASER/STENT PLACEMENT Left 02/02/2018   Procedure: CYSTOSCOPY/URETEROSCOPY/HOLMIUM LASER/STENT PLACEMENT;  Surgeon: Hollice Espy, MD;  Location: [facility] ORS;  Service: Urology;  Laterality: Left;  . THYROIDECTOMY, PARTIAL  2005   Right and isthmus. Dr Nadeen Landau  . TONSILLECTOMY AND ADENOIDECTOMY  1985  . UMBILICAL HERNIA REPAIR  12/30/2013   [place]  . VENTRAL HERNIA REPAIR N/A 02/02/2018   Procedure: LAPAROSCOPIC VENTRAL HERNIA;  Surgeon: Robert Bellow, MD;  Location: [facility] ORS;  Service: General;  Laterality: N/A;    Family History  Problem Relation Age of Onset  . Diabetes Paternal Grandmother   . Cancer Paternal Grandmother 32       breast  . Diabetes Mother   . Thyroid disease Mother   . Diabetes Father   . Thyroid disease Father   . Stroke Maternal Grandmother   . Diabetes Maternal Grandfather   . Cancer Other        stomach, lung (smoker)  . Coronary artery disease Neg Hx     Social History Social History   Tobacco Use  . Smoking status: Never Smoker  . Smokeless tobacco: Never Used  Vaping Use  . Vaping Use: Never  used  Substance Use Topics  . Alcohol use: Yes    Alcohol/week: 0.0 standard drinks    Comment: rare  . Drug use: No    Allergies  Allergen Reactions  . Penicillins Rash    Has patient had a PCN reaction causing immediate rash, facial/tongue/throat swelling, SOB or lightheadedness with hypotension: Yes Has patient had a PCN reaction causing severe rash involving mucus membranes or skin necrosis: No Has patient had a PCN reaction that required hospitalization: Unknown Has patient had a PCN reaction occurring within the last 10 years: No If all of the above answers are "NO", then may proceed with Cephalosporin use.   Marland Kitchen Lisinopril     Throat irritation, dry cough     Current Outpatient Medications  Medication Sig Dispense Refill  . atorvastatin (LIPITOR) 20 MG tablet TAKE 1 TABLET BY MOUTH  DAILY 90 tablet 0  . Blood Glucose Monitoring Suppl (CONTOUR NEXT MONITOR) w/Device KIT Use as instructed to check blood sugar once daily. 1 kit 0  . COLCRYS 0.6 MG tablet TAKE 1 TABLET (0.6 MG TOTAL) BY MOUTH DAILY. MAY TAKE 2 TABLETS ON THE FIRST DAY OF USE 90 tablet 0  . glimepiride (AMARYL) 2 MG tablet TAKE 1 TABLET BY MOUTH  DAILY WITH BREAKFAST 90 tablet 3  . glucose blood (CONTOUR NEXT TEST) test strip Use as instructed to check blood sugar once daily. 100 each 3  . HYDROcodone-acetaminophen (NORCO/VICODIN) 5-325 MG tablet Take one tablet at night for pain; may take up to every 6 hours as needed for pain if not working or driving    . losartan (COZAAR) 100 MG tablet TAKE 1 TABLET BY MOUTH  DAILY 90 tablet 0  . metFORMIN (GLUCOPHAGE) 1000 MG tablet TAKE 1 TABLET BY MOUTH  TWICE DAILY WITH A MEAL 180 tablet 0  . Microlet Lancets MISC Use as directed to check blood sugars once daily. 100 each 3  . naproxen (NAPROSYN) 500 MG tablet Take 1 tablet (500 mg total) by mouth 2 (two) times daily as needed for moderate pain. 180 tablet 1  . orphenadrine (NORFLEX) 100 MG tablet TAKE 1 TABLET (100 MG TOTAL) BY MOUTH 2 (TWO) TIMES DAILY AS NEEDED FOR MUSCLE SPASMS. 40 tablet 0  . Semaglutide,0.25 or 0.5MG/DOS, (OZEMPIC, 0.25 OR 0.5 MG/DOSE,) 2 MG/1.5ML SOPN Inject 0.5 mg into the skin once a week. 6 pen 3   No current facility-administered medications for this visit.    Review of Systems Review of Systems  Neurological: Positive for headaches.  All other systems reviewed and are negative. Or as discussed in the history of present illness.  Blood pressure 134/88, pulse 79, temperature 98.2 F (36.8 C), height _0  (1.778 m), weight (!) 305 lb (138.3 kg), SpO2 97 %.  Physical Exam Physical Exam Constitutional:      General: He is not in acute distress.     Appearance: He is obese.  HENT:     Head: Normocephalic and atraumatic.     Nose:     Comments: Covered with a mask    Mouth/Throat:     Comments: Covered with a mask Eyes:     General: No scleral icterus.       Right eye: No discharge.        Left eye: No discharge.     Comments: No proptosis or exophthalmos.  No lid lag or stare.  Neck:     Comments: He has a well-healed and faded transverse thyroidectomy scar.  The  right lobe of the thyroid is surgically absent.  The left lobe is enlarged, but secondary to his body habitus, I am not able to appreciate a discrete nodule.  The gland does move freely with deglutition.  Again, secondary to his obesity, I am unable to palpate any cervical or supraclavicular lymphadenopathy. Cardiovascular:     Rate and Rhythm: Normal rate and regular rhythm.  Pulmonary:     Effort: Pulmonary effort is normal.     Breath sounds: Normal breath sounds.  Abdominal:     General: Bowel sounds are normal.     Palpations: Abdomen is soft.     Comments: Protuberant, consistent with his level of obesity.  Genitourinary:    Comments: Deferred Musculoskeletal:     Comments: He has trace nonpitting ankle edema.  Skin:    General: Skin is warm and dry.  Neurological:     General: No focal deficit present.     Mental Status: He is alert and oriented to person, place, and time.  Psychiatric:        Mood and Affect: Mood normal.        Behavior: Behavior normal.     Data Reviewed His most recent TSH was 1.67 on July 07, 2020.  This is within normal range.  I reviewed the images from his head and neck ultrasound performed on September 24.  I concur with the radiology impression which is copied here:  CLINICAL DATA:  Palpable abnormality. 44 year old male with history of neck mass, prior right hemithyroidectomy.  EXAM: THYROID ULTRASOUND  TECHNIQUE: Ultrasound examination of the thyroid gland and adjacent soft tissues was  performed.  COMPARISON:  07/28/2015  FINDINGS: Parenchymal Echotexture: Mildly heterogenous  Isthmus: 0.8 cm  Right lobe: Surgically absent. No soft tissue masses in the surgical bed.  Left lobe: 5.1 x 2.0 x 2.5 cm, previously 6.2 x 2.1 x 2.7 cm  _________________________________________________________  Estimated total number of nodules >/= 1 cm: 1  Number of spongiform nodules >/=  2 cm not described below (TR1): 0  Number of mixed cystic and solid nodules >/= 1.5 cm not described below ([place]): 0  _________________________________________________________  Nodule # 1:  Prior biopsy: No  Location: Left; Inferior  Maximum size: 3.0 cm; Other 2 dimensions: 2.4 x 1.6 cm, previously, 2.7 x 2.3 x 1.9 cm  Composition: cannot determine (2)  Echogenicity: very hypoechoic (3)  Shape: taller-than-wide (3)  Margins: lobulated/irregular (2)  Echogenic foci: peripheral calcifications (2)  ACR TI-RADS total points: 12.  ACR TI-RADS risk category:  TR5 (>/= 7 points).  Significant change in size (>/= 20% in two dimensions and minimal increase of 2 mm): No  Change in features: Yes  Change in ACR TI-RADS risk category: Yes  ACR TI-RADS recommendations:  **Given size (>/= 1.0 cm) and appearance, fine needle aspiration of this highly suspicious nodule should be considered based on TI-RADS criteria.  _________________________________________________________  IMPRESSION: 1. Left lower pole thyroid nodule measuring up to 3.0 cm with sonographic features highly suspicious for malignancy. Recommend tissue sampling. 2. Postsurgical changes after right hemithyroidectomy without evidence of soft tissue mass in the surgical bed.  I reviewed the pathology from his fine-needle aspiration biopsy performed on July 17, 2020.  It is copied here: CYTOLOGY - NON PAP  CASE: [id number]  PATIENT: Phill Myron  Non-Gynecological Cytology Report       Specimen Submitted:  A. Thyroid, left lower pole; FNA   Clinical History: None provided       DIAGNOSIS:  A. THYROID, LEFT LOWER POLE/ ULTRASOUND-GUIDED FINE NEEDLE ASPIRATION:  - BENIGN (BETHESDA CATEGORY II).  - BENIGN FOLLICULAR EPITHELIUM WITH HURTHLE CELL CHANGES AND CYST  CONTENTS.   Assessment This is a 44 year old man who is status post prior right hemithyroidectomy who now has a symptomatic left thyroid nodule.  Biopsy was benign, but the compressive symptoms are bothersome to him and he would like to proceed with completion thyroidectomy.  Plan The risks of thyroid surgery were discussed, including (but not limited to): bleeding, infection, damage to surrounding structures/tissues, injury (temporary or permanent) to the recurrent laryngeal nerve, hypoparathyroidism (temporary or permanent), need for thyroid hormone replacement therapy, need for additional surgery and/or treatment, recurrence of disease, tracheostomy (temporary or permanent).  The patient had the opportunity to ask any questions and these were answered to their satisfaction.     Fredirick Maudlin 09/05/2020, 11:41 AM

## 2020-09-06 ENCOUNTER — Telehealth: Payer: Self-pay | Admitting: General Surgery

## 2020-09-06 NOTE — Telephone Encounter (Signed)
Patient has been advised of Pre-Admission date/time, COVID Testing date and Surgery date.  Surgery Date: 10/02/20 Preadmission Testing Date: 09/22/20 (phone 1p-5p) Covid Testing Date: 09/28/20 - patient advised to go to the [place] ([facility] between 8a-1p   Patient has been made aware to call [phone], between 1-3:00pm the day before surgery, to find out what time to arrive for surgery.

## 2020-09-07 ENCOUNTER — Other Ambulatory Visit: Payer: Self-pay | Admitting: Family Medicine

## 2020-09-07 NOTE — Telephone Encounter (Signed)
Naproxen Last filled:  07/02/20, #180 Last OV:  08/25/20, CPE Next OV:  10/25/20, 2 mo DM f/u

## 2020-09-21 ENCOUNTER — Other Ambulatory Visit: Payer: Self-pay | Admitting: Family Medicine

## 2020-09-22 ENCOUNTER — Inpatient Hospital Stay: Admission: RE | Admit: 2020-09-22 | Payer: 59 | Source: Ambulatory Visit

## 2020-09-28 ENCOUNTER — Encounter
Admission: RE | Admit: 2020-09-28 | Discharge: 2020-09-28 | Disposition: A | Discharge status: Home / Self Care | Payer: 59 | Source: Ambulatory Visit | Attending: General Surgery | Admitting: General Surgery

## 2020-09-28 ENCOUNTER — Other Ambulatory Visit: Payer: Self-pay

## 2020-09-28 ENCOUNTER — Other Ambulatory Visit: Payer: 59

## 2020-09-28 HISTORY — DX: Essential (primary) hypertension: I10

## 2020-09-28 NOTE — Patient Instructions (Signed)
Your procedure is scheduled on: October 02, 2020 Monday  Report to the Registration Desk on the 1st floor of the [facility]. To find out your arrival time, please call [phone] between 1PM - 3PM on: Friday September 29, 2020  REMEMBER: Instructions that are not followed completely may result in serious medical risk, up to and including death; or upon the discretion of your surgeon and anesthesiologist your surgery may need to be rescheduled.  Do not eat food after midnight the night before surgery.  No gum chewing, lozengers or hard candies.  You may however, drink CLEAR liquids up to 2 hours before you are scheduled to arrive for your surgery. Do not drink anything within 2 hours of your scheduled arrival time.  Clear liquids include: - water   Do NOT drink anything that is not on this list.  Type 1 and Type 2 diabetics should only drink water.  TAKE THESE MEDICATIONS THE MORNING OF SURGERY WITH A SIP OF WATER: ATORVASTATIN   DO NOT TAKE LOSARTAN MORNING OF SURGERY   Stop Metformin 2 days prior to surgery. LAST DOSE Friday September 29, 2020.  One week prior to surgery: Stop Anti-inflammatories (NSAIDS) such as Advil, Aleve, Ibuprofen, Motrin, Naproxen, Naprosyn and ASPIRIN  OR Aspirin based products such as Excedrin, Goodys Powder, BC Powder.  USE TYLENOL IF NEEDED Stop ANY OVER THE COUNTER supplements until after surgery. (However, you may continue taking Vitamin D, Vitamin B, and multivitamin up until the day before surgery.)  No Alcohol for 24 hours before or after surgery.  No Smoking including e-cigarettes for 24 hours prior to surgery.  No chewable tobacco products for at least 6 hours prior to surgery.  No nicotine patches on the day of surgery.  Do not use any "recreational" drugs for at least a week prior to your surgery.  Please be advised that the combination of cocaine and anesthesia may have negative outcomes, up to and including death. If you test  positive for cocaine, your surgery will be cancelled.  On the morning of surgery brush your teeth with toothpaste and water, you may rinse your mouth with mouthwash if you wish. Do not swallow any toothpaste or mouthwash.  Do not wear jewelry, make-up, hairpins, clips or nail polish.  Do not wear lotions, powders, or perfumes OR AFTERSHAVE OR DEODORANT  Do not shave body from the neck down 48 hours prior to surgery just in case you cut yourself which could leave a site for infection.  Also, freshly shaved skin may become irritated if using the CHG soap.  Contact lenses, hearing aids and dentures may not be worn into surgery.  Do not bring valuables to the hospital. [facility] is not responsible for any missing/lost belongings or valuables.   Use CHG Soap as directed on instruction sheet.  Notify your doctor if there is any change in your medical condition (cold, fever, infection).  Wear comfortable clothing (specific to your surgery type) to the hospital.  Plan for stool softeners for home use; pain medications have a tendency to cause constipation. You can also help prevent constipation by eating foods high in fiber such as fruits and vegetables and drinking plenty of fluids as your diet allows.  After surgery, you can help prevent lung complications by doing breathing exercises.  Take deep breaths and cough every 1-2 hours. Your doctor may order a device called an Incentive Spirometer to help you take deep breaths. When coughing or sneezing, hold a pillow firmly against  your incision with both hands. This is called "splinting." Doing this helps protect your incision. It also decreases belly discomfort.  If you are being admitted to the hospital overnight, MAY BRING [place].  If you are being discharged the day of surgery, you will not be allowed to drive home. You will need a responsible adult (18 years or older) to drive you home and stay with you that night.   If  you are taking public transportation, you will need to have a responsible adult (18 years or older) with you. Please confirm with your physician that it is acceptable to use public transportation.   Please call the [place] Dept. at [phone] if you have any questions about these instructions.  Visitation Policy:  Patients undergoing a surgery or procedure may have one family member or support person with them as long as that person is not COVID-19 positive or experiencing its symptoms.  That person may remain in the waiting area during the procedure.  Inpatient Visitation Update:   In an effort to ensure the safety of our team members and our patients, we are implementing a change to our visitation policy:  Effective Monday, Aug. 9, at 7 a.m., inpatients will be allowed one support person.  o The support person may change daily.  o The support person must pass our screening, gel in and out, and wear a mask at all times, including in the patient's room.  o Patients must also wear a mask when staff or their support person are in the room.  o Masking is required regardless of vaccination status.  Systemwide, no visitors 17 or younger.

## 2020-09-29 ENCOUNTER — Other Ambulatory Visit
Admission: RE | Admit: 2020-09-29 | Discharge: 2020-09-29 | Disposition: A | Discharge status: Home / Self Care | Payer: 59 | Source: Ambulatory Visit | Attending: General Surgery | Admitting: General Surgery

## 2020-09-29 DIAGNOSIS — Z20822 Contact with and (suspected) exposure to covid-19: Secondary | ICD-10-CM | POA: Diagnosis not present

## 2020-09-29 DIAGNOSIS — Z01812 Encounter for preprocedural laboratory examination: Secondary | ICD-10-CM | POA: Diagnosis not present

## 2020-09-30 LAB — SARS CORONAVIRUS 2 (TAT 6-24 HRS): SARS Coronavirus 2: NEGATIVE

## 2020-10-02 ENCOUNTER — Other Ambulatory Visit: Payer: Self-pay

## 2020-10-02 ENCOUNTER — Observation Stay
Admission: RE | Admit: 2020-10-02 | Discharge: 2020-10-03 | Disposition: A | Discharge status: Home / Self Care | Payer: 59 | Attending: General Surgery | Admitting: General Surgery

## 2020-10-02 ENCOUNTER — Ambulatory Visit: Payer: 59 | Admitting: Anesthesiology

## 2020-10-02 ENCOUNTER — Encounter: Payer: Self-pay | Admitting: General Surgery

## 2020-10-02 ENCOUNTER — Other Ambulatory Visit: Payer: 59

## 2020-10-02 ENCOUNTER — Encounter
Admission: RE | Disposition: A | Discharge status: Home / Self Care | Payer: Self-pay | Source: Home / Self Care | Attending: General Surgery

## 2020-10-02 DIAGNOSIS — E119 Type 2 diabetes mellitus without complications: Secondary | ICD-10-CM | POA: Insufficient documentation

## 2020-10-02 DIAGNOSIS — E890 Postprocedural hypothyroidism: Secondary | ICD-10-CM

## 2020-10-02 DIAGNOSIS — D34 Benign neoplasm of thyroid gland: Secondary | ICD-10-CM

## 2020-10-02 DIAGNOSIS — E041 Nontoxic single thyroid nodule: Secondary | ICD-10-CM

## 2020-10-02 DIAGNOSIS — E042 Nontoxic multinodular goiter: Principal | ICD-10-CM | POA: Insufficient documentation

## 2020-10-02 DIAGNOSIS — Z7984 Long term (current) use of oral hypoglycemic drugs: Secondary | ICD-10-CM | POA: Diagnosis not present

## 2020-10-02 LAB — GLUCOSE, CAPILLARY
Glucose-Capillary: 254 mg/dL — ABNORMAL HIGH (ref 70–99)
Glucose-Capillary: 251 mg/dL — ABNORMAL HIGH (ref 70–99)

## 2020-10-02 LAB — CALCIUM: Calcium: 8.2 mg/dL — ABNORMAL LOW (ref 8.9–10.3)

## 2020-10-02 LAB — ALBUMIN: Albumin: 3.4 g/dL — ABNORMAL LOW (ref 3.5–5.0)

## 2020-10-02 SURGERY — THYROIDECTOMY, COMPLETION
Anesthesia: General

## 2020-10-02 MED ORDER — IBUPROFEN 600 MG PO TABS: 600.0000 mg | ORAL_TABLET | Freq: Four times a day (QID) | ORAL | Status: DC | PRN

## 2020-10-02 MED ORDER — HEMOSTATIC AGENTS (NO CHARGE) OPTIME
TOPICAL | Status: DC | PRN
  Administered 2020-10-02: 1 via TOPICAL

## 2020-10-02 MED ORDER — METAXALONE 800 MG PO TABS
800.0000 mg | ORAL_TABLET | Freq: Every day | ORAL | Status: DC | PRN
  Filled 2020-10-02: qty 1

## 2020-10-02 MED ORDER — ROCURONIUM BROMIDE 100 MG/10ML IV SOLN
INTRAVENOUS | Status: DC | PRN
  Administered 2020-10-02: 10 mg via INTRAVENOUS

## 2020-10-02 MED ORDER — DEXAMETHASONE SODIUM PHOSPHATE 10 MG/ML IJ SOLN
INTRAMUSCULAR | Status: AC
  Filled 2020-10-02: qty 1

## 2020-10-02 MED ORDER — GLIMEPIRIDE 2 MG PO TABS
2.0000 mg | ORAL_TABLET | Freq: Every day | ORAL | Status: DC
  Administered 2020-10-03: 08:00:00 2 mg via ORAL
  Filled 2020-10-02: qty 1

## 2020-10-02 MED ORDER — OXYCODONE HCL 5 MG PO TABS: 5.0000 mg | ORAL_TABLET | ORAL | Status: DC | PRN

## 2020-10-02 MED ORDER — FENTANYL CITRATE (PF) 100 MCG/2ML IJ SOLN: 25.0000 ug | INTRAMUSCULAR | Status: DC | PRN

## 2020-10-02 MED ORDER — LIDOCAINE HCL 4 % MT SOLN
OROMUCOSAL | Status: DC | PRN
  Administered 2020-10-02: 4 mL via TOPICAL

## 2020-10-02 MED ORDER — ONDANSETRON HCL 4 MG/2ML IJ SOLN
INTRAMUSCULAR | Status: DC | PRN
  Administered 2020-10-02: 4 mg via INTRAVENOUS

## 2020-10-02 MED ORDER — KETAMINE HCL 50 MG/ML IJ SOLN
INTRAMUSCULAR | Status: DC | PRN
Start: 2020-10-02 — End: 2020-10-02
  Administered 2020-10-02: 50 mg via INTRAMUSCULAR

## 2020-10-02 MED ORDER — ONDANSETRON HCL 4 MG/2ML IJ SOLN
INTRAMUSCULAR | Status: AC
  Filled 2020-10-02: qty 2

## 2020-10-02 MED ORDER — SODIUM CHLORIDE 0.9 % IV SOLN: INTRAVENOUS | Status: DC

## 2020-10-02 MED ORDER — ACETAMINOPHEN 500 MG PO TABS
ORAL_TABLET | ORAL | Status: AC
  Administered 2020-10-02: 13:00:00 1000 mg via ORAL
  Filled 2020-10-02: qty 2

## 2020-10-02 MED ORDER — MENTHOL 3 MG MT LOZG
1.0000 | LOZENGE | OROMUCOSAL | Status: DC | PRN
  Filled 2020-10-02: qty 9

## 2020-10-02 MED ORDER — CHLORHEXIDINE GLUCONATE CLOTH 2 % EX PADS: 6.0000 | MEDICATED_PAD | Freq: Once | CUTANEOUS | Status: DC

## 2020-10-02 MED ORDER — MIDAZOLAM HCL 2 MG/2ML IJ SOLN
INTRAMUSCULAR | Status: DC | PRN
  Administered 2020-10-02: 2 mg via INTRAVENOUS

## 2020-10-02 MED ORDER — SIMETHICONE 80 MG PO CHEW
40.0000 mg | CHEWABLE_TABLET | Freq: Four times a day (QID) | ORAL | Status: DC | PRN
  Filled 2020-10-02: qty 1

## 2020-10-02 MED ORDER — FENTANYL CITRATE (PF) 100 MCG/2ML IJ SOLN
INTRAMUSCULAR | Status: AC
  Filled 2020-10-02: qty 2

## 2020-10-02 MED ORDER — COLCHICINE 0.6 MG PO TABS
0.6000 mg | ORAL_TABLET | Freq: Every day | ORAL | Status: DC | PRN
  Filled 2020-10-02: qty 1

## 2020-10-02 MED ORDER — TRAMADOL HCL 50 MG PO TABS
ORAL_TABLET | ORAL | Status: AC
  Filled 2020-10-02: qty 1

## 2020-10-02 MED ORDER — ORAL CARE MOUTH RINSE: 15.0000 mL | Freq: Once | OROMUCOSAL | Status: AC

## 2020-10-02 MED ORDER — MIDAZOLAM HCL 2 MG/2ML IJ SOLN
INTRAMUSCULAR | Status: AC
  Filled 2020-10-02: qty 2

## 2020-10-02 MED ORDER — ACETAMINOPHEN 500 MG PO TABS
ORAL_TABLET | ORAL | Status: AC
  Filled 2020-10-02: qty 2

## 2020-10-02 MED ORDER — OXYCODONE HCL 5 MG/5ML PO SOLN: 5.0000 mg | Freq: Once | ORAL | Status: DC | PRN

## 2020-10-02 MED ORDER — LOSARTAN POTASSIUM 50 MG PO TABS
100.0000 mg | ORAL_TABLET | Freq: Every day | ORAL | Status: DC
  Administered 2020-10-02: 15:00:00 100 mg via ORAL
  Filled 2020-10-02 (×3): qty 2

## 2020-10-02 MED ORDER — CALCIUM CARBONATE 1250 (500 CA) MG PO TABS
500.0000 mg | ORAL_TABLET | Freq: Three times a day (TID) | ORAL | Status: DC
  Administered 2020-10-02 – 2020-10-03 (×2): 500 mg via ORAL
  Filled 2020-10-02 (×3): qty 1

## 2020-10-02 MED ORDER — ACETAMINOPHEN 500 MG PO TABS
ORAL_TABLET | ORAL | Status: AC
  Administered 2020-10-02: 07:00:00 1000 mg via ORAL
  Filled 2020-10-02: qty 2

## 2020-10-02 MED ORDER — OXYCODONE HCL 5 MG PO TABS: 5.0000 mg | ORAL_TABLET | Freq: Once | ORAL | Status: DC | PRN

## 2020-10-02 MED ORDER — DEXAMETHASONE SODIUM PHOSPHATE 10 MG/ML IJ SOLN
INTRAMUSCULAR | Status: DC | PRN
  Administered 2020-10-02: 10 mg via INTRAVENOUS

## 2020-10-02 MED ORDER — PROPOFOL 10 MG/ML IV BOLUS
INTRAVENOUS | Status: AC
  Filled 2020-10-02: qty 20

## 2020-10-02 MED ORDER — ATORVASTATIN CALCIUM 20 MG PO TABS
20.0000 mg | ORAL_TABLET | Freq: Every day | ORAL | Status: DC
  Administered 2020-10-02: 15:00:00 20 mg via ORAL
  Filled 2020-10-02 (×2): qty 1

## 2020-10-02 MED ORDER — SUCCINYLCHOLINE CHLORIDE 20 MG/ML IJ SOLN
INTRAMUSCULAR | Status: DC | PRN
  Administered 2020-10-02: 160 mg via INTRAVENOUS

## 2020-10-02 MED ORDER — LEVOTHYROXINE SODIUM 200 MCG PO TABS
200.0000 ug | ORAL_TABLET | Freq: Every day | ORAL | Status: DC
  Administered 2020-10-03: 06:00:00 200 ug via ORAL
  Filled 2020-10-02: qty 1

## 2020-10-02 MED ORDER — LIDOCAINE HCL (CARDIAC) PF 100 MG/5ML IV SOSY
PREFILLED_SYRINGE | INTRAVENOUS | Status: DC | PRN
  Administered 2020-10-02: 100 mg via INTRAVENOUS

## 2020-10-02 MED ORDER — TRAMADOL HCL 50 MG PO TABS
50.0000 mg | ORAL_TABLET | Freq: Four times a day (QID) | ORAL | Status: DC | PRN
  Administered 2020-10-02: 50 mg via ORAL

## 2020-10-02 MED ORDER — ACETAMINOPHEN 500 MG PO TABS
1000.0000 mg | ORAL_TABLET | Freq: Four times a day (QID) | ORAL | Status: DC
  Administered 2020-10-02: 21:00:00 1000 mg via ORAL

## 2020-10-02 MED ORDER — CHLORHEXIDINE GLUCONATE 0.12 % MT SOLN: 15.0000 mL | Freq: Once | OROMUCOSAL | Status: AC

## 2020-10-02 MED ORDER — FENTANYL CITRATE (PF) 250 MCG/5ML IJ SOLN
INTRAMUSCULAR | Status: DC | PRN
  Administered 2020-10-02 (×4): 50 ug via INTRAVENOUS

## 2020-10-02 MED ORDER — ACETAMINOPHEN 500 MG PO TABS: 1000.0000 mg | ORAL_TABLET | ORAL | Status: AC

## 2020-10-02 MED ORDER — LIDOCAINE HCL (PF) 2 % IJ SOLN
INTRAMUSCULAR | Status: AC
  Filled 2020-10-02: qty 5

## 2020-10-02 MED ORDER — "VISTASEAL 4 ML SINGLE DOSE KIT "
PACK | CUTANEOUS | Status: DC | PRN
  Administered 2020-10-02: 4 mL via TOPICAL

## 2020-10-02 MED ORDER — CHLORHEXIDINE GLUCONATE 0.12 % MT SOLN
OROMUCOSAL | Status: AC
  Administered 2020-10-02: 07:00:00 15 mL via OROMUCOSAL
  Filled 2020-10-02: qty 15

## 2020-10-02 MED ORDER — FAMOTIDINE 20 MG PO TABS
ORAL_TABLET | ORAL | Status: AC
  Administered 2020-10-02: 07:00:00 20 mg via ORAL
  Filled 2020-10-02: qty 1

## 2020-10-02 MED ORDER — SUCCINYLCHOLINE CHLORIDE 200 MG/10ML IV SOSY
PREFILLED_SYRINGE | INTRAVENOUS | Status: AC
  Filled 2020-10-02: qty 10

## 2020-10-02 MED ORDER — PROPOFOL 10 MG/ML IV BOLUS
INTRAVENOUS | Status: DC | PRN
Start: 2020-10-02 — End: 2020-10-02
  Administered 2020-10-02: 200 mg via INTRAVENOUS

## 2020-10-02 MED ORDER — METFORMIN HCL 500 MG PO TABS
1000.0000 mg | ORAL_TABLET | Freq: Two times a day (BID) | ORAL | Status: DC
  Administered 2020-10-02 – 2020-10-03 (×2): 1000 mg via ORAL
  Filled 2020-10-02 (×2): qty 2

## 2020-10-02 MED ORDER — FAMOTIDINE 20 MG PO TABS: 20.0000 mg | ORAL_TABLET | Freq: Once | ORAL | Status: AC

## 2020-10-02 MED ORDER — ONDANSETRON HCL 4 MG/2ML IJ SOLN: 4.0000 mg | Freq: Four times a day (QID) | INTRAMUSCULAR | Status: DC | PRN

## 2020-10-02 MED ORDER — ROCURONIUM BROMIDE 10 MG/ML (PF) SYRINGE
PREFILLED_SYRINGE | INTRAVENOUS | Status: AC
  Filled 2020-10-02: qty 10

## 2020-10-02 MED ORDER — ONDANSETRON 4 MG PO TBDP: 4.0000 mg | ORAL_TABLET | Freq: Four times a day (QID) | ORAL | Status: DC | PRN

## 2020-10-02 SURGICAL SUPPLY — 49 items
"PENCIL ELECTRO HAND CTR " (MISCELLANEOUS) IMPLANT
BACTOSHIELD CHG 4% 4OZ (MISCELLANEOUS) ×1
BASIN GRAD PLASTIC 32OZ STRL (MISCELLANEOUS) ×2 IMPLANT
BLADE SURG 15 STRL LF DISP TIS (BLADE) ×1 IMPLANT
BLADE SURG 15 STRL SS (BLADE) ×1
CANISTER SUCT 1200ML W/VALVE (MISCELLANEOUS) ×2 IMPLANT
CLIP VESOCCLUDE SM WIDE 6/CT (CLIP) ×2 IMPLANT
COVER WAND RF STERILE (DRAPES) ×2 IMPLANT
DERMABOND ADVANCED (GAUZE/BANDAGES/DRESSINGS) ×1
DERMABOND ADVANCED .7 DNX12 (GAUZE/BANDAGES/DRESSINGS) ×1 IMPLANT
DRAPE LAPAROTOMY 77X122 PED (DRAPES) ×3 IMPLANT
DRAPE MAG INST 16X20 L/F (DRAPES) ×2 IMPLANT
DRSG TEGADERM 2-3/8X2-3/4 SM (GAUZE/BANDAGES/DRESSINGS) ×2 IMPLANT
DRSG TEGADERM 4X4.75 (GAUZE/BANDAGES/DRESSINGS) ×1 IMPLANT
ELECT BLADE 6.5 EXT (BLADE) ×1 IMPLANT
ELECT CAUTERY BLADE TIP 2.5 (TIP) ×2
ELECT LARYNGEAL DUAL CHAN (ELECTRODE) ×2 IMPLANT
ELECT NEEDLE 20X.3 GREEN (MISCELLANEOUS) ×2
ELECT REM PT RETURN 9FT ADLT (ELECTROSURGICAL) ×2
ELECTRODE CAUTERY BLDE TIP 2.5 (TIP) ×1 IMPLANT
ELECTRODE NDL 20X.3 GREEN (MISCELLANEOUS) ×1 IMPLANT
ELECTRODE NEEDLE 20X.3 GREEN (MISCELLANEOUS) ×1 IMPLANT
ELECTRODE REM PT RTRN 9FT ADLT (ELECTROSURGICAL) ×1 IMPLANT
GAUZE 4X4 16PLY RFD (DISPOSABLE) ×1 IMPLANT
GLOVE BIO SURGEON STRL SZ 6.5 (GLOVE) ×4 IMPLANT
GLOVE INDICATOR 7.0 STRL GRN (GLOVE) ×3 IMPLANT
GOWN STRL REUS W/ TWL LRG LVL3 (GOWN DISPOSABLE) ×2 IMPLANT
GOWN STRL REUS W/TWL LRG LVL3 (GOWN DISPOSABLE) ×3
HEMOSTAT SNOW SURGICEL 2X4 (HEMOSTASIS) ×2 IMPLANT
KIT TURNOVER KIT A (KITS) ×2 IMPLANT
LABEL OR SOLS (LABEL) ×2 IMPLANT
MANIFOLD NEPTUNE II (INSTRUMENTS) ×2 IMPLANT
NS IRRIG 500ML POUR BTL (IV SOLUTION) ×2 IMPLANT
PACK BASIN MINOR (MISCELLANEOUS) ×2 IMPLANT
PENCIL ELECTRO HAND CTR (MISCELLANEOUS) ×2 IMPLANT
PROBE NEUROSIGN BIPOL (MISCELLANEOUS) ×1 IMPLANT
PROBE NEUROSIGN BIPOLAR (MISCELLANEOUS) ×1
SCRUB CHG 4% DYNA-HEX 4OZ (MISCELLANEOUS) ×1 IMPLANT
SET WALTER ACTIVATION W/DRAPE (SET/KITS/TRAYS/PACK) ×1 IMPLANT
SHEARS HARMONIC 9CM CVD (BLADE) ×2 IMPLANT
SPONGE KITTNER 5P (MISCELLANEOUS) ×2 IMPLANT
STRIP CLOSURE SKIN 1/2X4 (GAUZE/BANDAGES/DRESSINGS) ×2 IMPLANT
SUT MNCRL AB 4-0 PS2 18 (SUTURE) IMPLANT
SUT PROLENE 4 0 PS 2 18 (SUTURE) ×2 IMPLANT
SUT SILK 2 0 (SUTURE) ×1
SUT SILK 2-0 18XBRD TIE 12 (SUTURE) ×1 IMPLANT
SUT VIC AB 4-0 RB1 27 (SUTURE) ×1
SUT VIC AB 4-0 RB1 27X BRD (SUTURE) ×1 IMPLANT
SYR BULB IRRIG 60ML STRL (SYRINGE) ×2 IMPLANT

## 2020-10-02 NOTE — Progress Notes (Signed)
Inpatient Diabetes Program Recommendations  AACE/ADA: New Consensus Statement on Inpatient Glycemic Control   Target Ranges:  Prepandial:   less than 140 mg/dL      Peak postprandial:   less than 180 mg/dL (1-2 hours)      Critically ill patients:  140 - 180 mg/dL   Results for HARALD, QUEVEDO (MRN [id number]) as of 10/02/2020 12:55  Ref. Range 10/02/2020 06:38 10/02/2020 10:26  Glucose-Capillary Latest Ref Range: 70 - 99 mg/dL 254 (H) 251 (H)   Review of Glycemic Control  Diabetes history: DM2 Outpatient Diabetes medications: Amaryl 2 mg daily, Metformin 1000 mg BID, Ozempic 0.5 mg Qweek Current orders for Inpatient glycemic control: Amaryl 2 mg daily, Metformin 1000 mg BID  Inpatient Diabetes Program Recommendations:    Insulin: Please consider using Glycemic Control order set to order CBGs AC&HS with Novolog 0-9 units TID with meals and Novolog 0-5 units QHS while inpatient.  NOTE: Noted patient received Decadron 10 mg x1 today which is contributing to hyperglycemia. Would recommend ordering Novolog correction while inpatient.  Thanks, Barnie Alderman, RN, MSN, CDE Diabetes Coordinator Inpatient Diabetes Program [phone] (Team Pager from 8am to 5pm)

## 2020-10-02 NOTE — Transfer of Care (Signed)
Immediate Anesthesia Transfer of Care Note  Patient: Jorge Boyle  Procedure(s) Performed: COMPLETION OF THYROIDECTOMY (N/A )  Patient Location: PACU  Anesthesia Type:General  Level of Consciousness: drowsy  Airway & Oxygen Therapy: Patient Spontanous Breathing and Patient connected to face mask oxygen  Post-op Assessment: Report given to RN and Post -op Vital signs reviewed and stable  Post vital signs: Reviewed and stable  Last Vitals:  Vitals Value Taken Time  BP 152/81 10/02/20 1025  Temp 36.4 C 10/02/20 1025  Pulse 100 10/02/20 1027  Resp 20 10/02/20 1027  SpO2 91 % 10/02/20 1027  Vitals shown include unvalidated device data.  Last Pain:  Vitals:   10/02/20 0713  TempSrc: Oral  PainSc: 0-No pain         Complications: No complications documented.

## 2020-10-02 NOTE — Anesthesia Postprocedure Evaluation (Signed)
Anesthesia Post Note  Patient: [name]  Procedure(s) Performed: COMPLETION OF THYROIDECTOMY (N/A )  Patient location during evaluation: PACU Anesthesia Type: General Level of consciousness: awake and alert Pain management: pain level controlled Vital Signs Assessment: post-procedure vital signs reviewed and stable Respiratory status: spontaneous breathing, nonlabored ventilation, respiratory function stable and patient connected to nasal cannula oxygen Cardiovascular status: blood pressure returned to baseline and stable Postop Assessment: no apparent nausea or vomiting Anesthetic complications: no   No complications documented.   Last Vitals:  Vitals:   10/02/20 1135 10/02/20 1140  BP: (!) 148/91   Pulse: 96 99  Resp: (!) 25 (!) 21  Temp:    SpO2: 93% 92%    Last Pain:  Vitals:   10/02/20 1110  TempSrc:   PainSc: 0-No pain                 Precious Haws Isack Lavalley

## 2020-10-02 NOTE — Op Note (Signed)
Operative Note  Preoperative Diagnosis:  a multinodular goiter  Postoperative Diagnosis:  a multinodular goiter  Operation:  Completion Thyroidectomy  Surgeon: Fredirick Maudlin, MD  Assistant: Ardath Sax, MD (a second surgeon was necessary for adequate exposure and for technical skill necessary to complete the case)  Anesthesia: General endotracheal with nerve monitoring system  Findings: The subplatysmal planes were densely scarred, as were the superficial planes on the left, suggesting that there had been an initial superficial exploration, before determining to only perform a right thyroid lobectomy at his prior procedure.  Both parathyroid glands were visualized and preserved on vascular pedicles.  The recurrent laryngeal nerve was well visualized and given excellent functional signal throughout the operation.  Indications: This is a 44 year old man who underwent a right thyroid lobectomy in about 2005.  He subsequently has developed compressive symptoms due to the growth of nodules on the left.  He was interested in completion thyroidectomy.  The risks of the operation were discussed with him in detail and he agreed to proceed.  Procedure In Detail: The patient was identified in the preoperative holding area and brought to the operating room where he was placed supine on the OR table.  Bony prominences were padded and bilateral sequential compression devices were placed on the lower extremities.  General endotracheal anesthesia was induced using the nerve monitoring system.  Due to a difficult intubation, placement verification was not possible.  The grounding lead was placed.  He was positioned appropriately for the operation and sterilely prepped and draped in standard fashion.  A timeout was performed confirming his identity, the procedure being performed, his allergies, all necessary equipment was available, and that maintenance anesthesia was adequate.  The patient's prior incision was  reopened for a length of 6 cm.  This was carried down through the subcutaneous tissue and platysma using electrocautery.  There was significant scar tissue along the entire length of the incision.  The subplatysmal flaps were elevated, once again encountering moderate scar tissue.  The strap muscles were then opened in the midline.  We began elevating the strap muscles off of the left lobe of the thyroid.  There was still scar present, suggesting that the previous surgeon had at least initially explored the side.  Ultimately, we were able to retract the strap muscles off the lobe of the thyroid.  The superior pole vessels were sequentially isolated and divided with silk ties and the harmonic scalpel.  The superior parathyroid gland was identified and preserved on a good pedicle.  We then rotated the thyroid medially and opened the tracheoesophageal groove.  The recurrent laryngeal nerve was identified.  It gave a good signal when stimulated.  The trachea was exposed medial to the nerve and the inferior pole vessels were divided.  The inferior parathyroid gland was identified and preserved on a good pedicle.  The nerve was dissected up to its insertion point at the cricopharyngeal muscle.  The ligament of Berry and anterior suspensory ligament were divided.  Due to the close proximity of the ligament of Berry to the nerve, a small amount of thyroid tissue was left behind.  The remaining attachments of the thyroid to the trachea were divided and the lobe was completely excised.  It was handed off as a specimen.  We irrigated the wound bed and obtain good hemostasis.  A Valsalva maneuver from the anesthesia team confirmed no ongoing surgical bleeding. SNoW and Vistaseal were used for additional hemostasis.  The strap muscles were closed in the  midline with running 4-0 Vicryl in the platysma was closed with interrupted Vicryl.  The skin was closed with running subcuticular Monocryl.  The skin was cleaned.  Dermabond and  Steri-Strips were applied.  The patient was awakened, extubated, and taken to the postanesthesia care unit in good condition.  EBL: 15 cc  IVF: See anesthesia record  Specimen(s): Left thyroid lobe to pathology for permanent section  Complications: none immediately apparent.   Counts: all needles, instruments, and sponges were counted and reported to be correct in number at the end of the case.   I was present for and participated in the entire operation.  Fredirick Maudlin 10:31 AM

## 2020-10-02 NOTE — Anesthesia Preprocedure Evaluation (Signed)
Anesthesia Evaluation  Patient identified by MRN, date of birth, ID band Patient awake    Reviewed: Allergy & Precautions, H&P , NPO status , Patient's Chart, lab work & pertinent test results  History of Anesthesia Complications (+) DIFFICULT AIRWAY and history of anesthetic complications  Airway Mallampati: III  TM Distance: >3 FB Neck ROM: full    Dental  (+) Chipped, Poor Dentition, Missing   Pulmonary neg pulmonary ROS, neg shortness of breath,    Pulmonary exam normal        Cardiovascular Exercise Tolerance: Good hypertension, (-) angina(-) Past MI and (-) DOE Normal cardiovascular exam     Neuro/Psych  Headaches, negative psych ROS   GI/Hepatic negative GI ROS, Neg liver ROS, neg GERD  ,  Endo/Other  diabetes, Type 2  Renal/GU Renal disease     Musculoskeletal   Abdominal   Peds  Hematology negative hematology ROS (+)   Anesthesia Other Findings Past Medical History: No date: Allergic rhinitis No date: Diabetes mellitus without complication (HCC) No date: Difficult intubation     Comment:  PT DOES NOT REMEMBER THIS No date: Headache No date: History of chicken pox No date: History of kidney stones No date: Hypertension 01/2012: Multiple thyroid nodules     Comment:  left, biopsy x2 benign, rpt [place] stable (01/2013, 07/2015)               rec rpt 1 yr No date: Nontoxic nodular goiter No date: Obesity No date: Prediabetes 2005: S/P excision of thyroid adenoma     Comment:  R thyroidectomy  Past Surgical History: 02/02/2018: CYSTOSCOPY/URETEROSCOPY/HOLMIUM LASER/STENT PLACEMENT; Left     Comment:  Procedure: CYSTOSCOPY/URETEROSCOPY/HOLMIUM LASER/STENT               PLACEMENT;  Surgeon: Hollice Espy, MD;  Location: [facility]              ORS;  Service: Urology;  Laterality: Left; 2005: THYROIDECTOMY, PARTIAL     Comment:  Right and isthmus. Dr Nadeen Landau 1985: [place] 15/17/6160: UMBILICAL HERNIA REPAIR     Comment:  [place] 02/02/2018: VENTRAL HERNIA REPAIR; N/A     Comment:  Procedure: LAPAROSCOPIC VENTRAL HERNIA;  Surgeon:               Robert Bellow, MD;  Location: [facility] ORS;  Service:               General;  Laterality: N/A;     Reproductive/Obstetrics negative OB ROS                             Anesthesia Physical Anesthesia Plan  ASA: III  Anesthesia Plan: General ETT   Post-op Pain Management:    Induction: Intravenous  PONV Risk Score and Plan: Ondansetron, Dexamethasone, Midazolam and Treatment may vary due to age or medical condition  Airway Management Planned: Oral ETT and Video Laryngoscope Planned  Additional Equipment:   Intra-op Plan:   Post-operative Plan: Extubation in OR  Informed Consent: I have reviewed the patients History and Physical, chart, labs and discussed the procedure including the risks, benefits and alternatives for the proposed anesthesia with the patient or authorized representative who has indicated his/her understanding and acceptance.     Dental Advisory Given  Plan Discussed with: Anesthesiologist, CRNA and Surgeon  Anesthesia Plan Comments: (Patient consented for risks of anesthesia including but not limited to:  - adverse reactions to medications -  damage to eyes, teeth, lips or other oral mucosa - nerve damage due to positioning  - sore throat or hoarseness - Damage to heart, brain, nerves, lungs, other parts of body or loss of life  Patient voiced understanding.)        Anesthesia Quick Evaluation

## 2020-10-02 NOTE — Anesthesia Procedure Notes (Signed)
Procedure Name: Intubation Date/Time: 10/02/2020 7:41 AM Performed by: Eben Burow, CRNA Pre-anesthesia Checklist: Patient identified, Emergency Drugs available, Suction available and Patient being monitored Patient Re-evaluated:Patient Re-evaluated prior to induction Oxygen Delivery Method: Circle system utilized Preoxygenation: Pre-oxygenation with 100% oxygen Induction Type: IV induction Laryngoscope Size: McGraph and 4 Grade View: Grade I Tube type: Oral Tube size: 8.0 mm Number of attempts: 1 Airway Equipment and Method: Video-laryngoscopy and Bougie stylet Placement Confirmation: ETT inserted through vocal cords under direct vision,  positive ETCO2 and breath sounds checked- equal and bilateral Secured at: 22 cm Tube secured with: Tape Dental Injury: Teeth and Oropharynx as per pre-operative assessment  Difficulty Due To: Difficulty was anticipated, Difficult Airway- due to anterior larynx and Difficult Airway- due to reduced neck mobility Future Recommendations: Recommend- induction with short-acting agent, and alternative techniques readily available

## 2020-10-02 NOTE — Interval H&P Note (Signed)
History and Physical Interval Note:  10/02/2020 7:17 AM  Jorge Boyle  has presented today for surgery, with the diagnosis of Thyroid nodule.  The various methods of treatment have been discussed with the patient and family. After consideration of risks, benefits and other options for treatment, the patient has consented to  Procedure(s): COMPLETION OF THYROIDECTOMY (N/A) as a surgical intervention.  The patient's history has been reviewed, patient examined, no change in status, stable for surgery.  I have reviewed the patient's chart and labs.  Questions were answered to the patient's satisfaction.     Fredirick Maudlin

## 2020-10-03 DIAGNOSIS — E042 Nontoxic multinodular goiter: Secondary | ICD-10-CM | POA: Diagnosis not present

## 2020-10-03 LAB — CALCIUM: Calcium: 8.9 mg/dL (ref 8.9–10.3)

## 2020-10-03 LAB — ALBUMIN: Albumin: 3.7 g/dL (ref 3.5–5.0)

## 2020-10-03 MED ORDER — TRAMADOL HCL 50 MG PO TABS: 50.0000 mg | ORAL_TABLET | Freq: Four times a day (QID) | ORAL | 0 refills | Status: DC | PRN

## 2020-10-03 MED ORDER — LEVOTHYROXINE SODIUM 200 MCG PO TABS: 200.0000 ug | ORAL_TABLET | Freq: Every day | ORAL | 3 refills | Status: DC

## 2020-10-03 MED ORDER — ACETAMINOPHEN 500 MG PO TABS
ORAL_TABLET | ORAL | Status: AC
  Administered 2020-10-03: 08:00:00 1000 mg via ORAL
  Filled 2020-10-03: qty 2

## 2020-10-03 MED ORDER — ACETAMINOPHEN 500 MG PO TABS
ORAL_TABLET | ORAL | Status: AC
  Administered 2020-10-03: 03:00:00 1000 mg via ORAL
  Filled 2020-10-03: qty 2

## 2020-10-03 MED ORDER — IBUPROFEN 600 MG PO TABS: 600.0000 mg | ORAL_TABLET | Freq: Four times a day (QID) | ORAL | 0 refills | Status: DC | PRN

## 2020-10-03 MED ORDER — CALCIUM CARBONATE 1250 (500 CA) MG PO TABS: 1.0000 | ORAL_TABLET | Freq: Three times a day (TID) | ORAL | 0 refills | Status: DC

## 2020-10-03 NOTE — Discharge Summary (Addendum)
[facility] SURGICAL DISCHARGE SUMMARY   Patient ID: Jorge Boyle MRN: [id number] DOB/AGE: 1976/10/07 44 y.o.  Admit date: 10/02/2020 Discharge date: 10/03/2020  Discharge Diagnoses Patient Active Problem List   Diagnosis Date Noted   S/P completion thyroidectomy 10/02/2020   Thyroid nodule 01/14/2012   S/P excision of thyroid adenoma 01/01/2012    Consultants None   Procedures 10/02/2020:  Completion Thyroidectomy   HPI: Jorge Boyle is a 44 y.o. male with a history of prior right thyroid lobectomy around 2005 now with compressive symptoms secondary to left thyroid nodule with benign biopsy who presents for completion thyroidectomy with Dr Celine Ahr.    Hospital Course: Informed consent was obtained and documented, and patient underwent uneventful completion thyroidectomy (Dr Celine Ahr, 10/02/2020).  Post-operatively, patient's calcium levels normalized and advancement of patient's diet and ambulation were well-tolerated. The remainder of patient's hospital course was essentially unremarkable, and discharge planning was initiated accordingly with patient safely able to be discharged home with appropriate discharge instructions, pain control, and outpatient follow-up after all of his questions were answered to his expressed satisfaction.   Discharge Condition: Good   Physical Examination:  Constitutional: Well appearing male, NAD Pulmonary: Normal effort, no respiratory distress, good phonation Skin: Transverse incision to the anterior neck is CDI with steri-strips, no erythema or drainage   Allergies as of 10/03/2020       Reactions   Penicillins Rash   Has patient had a PCN reaction causing immediate rash, facial/tongue/throat swelling, SOB or lightheadedness with hypotension: Yes Has patient had a PCN reaction causing severe rash involving mucus membranes or skin necrosis: No Has patient had a PCN reaction that required hospitalization:  Unknown Has patient had a PCN reaction occurring within the last 10 years: No If all of the above answers are "NO", then may proceed with Cephalosporin use.   Lisinopril    Throat irritation, dry cough        Medication List     TAKE these medications    atorvastatin 20 MG tablet Commonly known as: LIPITOR TAKE 1 TABLET BY MOUTH  DAILY   calcium carbonate 1250 (500 Ca) MG tablet Commonly known as: OS-CAL - dosed in mg of elemental calcium Take 1 tablet (500 mg of elemental calcium total) by mouth 3 (three) times daily with meals. Take one tablet (500 mg) three times a day until your follow up appointment   Colcrys 0.6 MG tablet Generic drug: colchicine TAKE 1 TABLET (0.6 MG TOTAL) BY MOUTH DAILY. MAY TAKE 2 TABLETS ON THE FIRST DAY OF USE What changed: See the new instructions.   Contour Next Monitor w/Device Kit Use as instructed to check blood sugar once daily.   Contour Next Test test strip Generic drug: glucose blood Use as instructed to check blood sugar once daily.   glimepiride 2 MG tablet Commonly known as: AMARYL TAKE 1 TABLET BY MOUTH  DAILY WITH BREAKFAST   HYDROcodone-acetaminophen 5-325 MG tablet Commonly known as: NORCO/VICODIN Take 1 tablet by mouth every 6 (six) hours as needed for moderate pain.   ibuprofen 600 MG tablet Commonly known as: ADVIL Take 1 tablet (600 mg total) by mouth every 6 (six) hours as needed (for mild pain not relieved by other medications.).   levothyroxine 200 MCG tablet Commonly known as: SYNTHROID Take 1 tablet (200 mcg total) by mouth daily at 6 (six) AM. Start taking on: October 04, 2020   losartan 100 MG tablet Commonly known as: COZAAR TAKE 1 TABLET BY MOUTH  DAILY   metaxalone 800 MG tablet Commonly known as: SKELAXIN Take 800 mg by mouth daily as needed for muscle spasms.   metFORMIN 1000 MG tablet Commonly known as: GLUCOPHAGE TAKE 1 TABLET BY MOUTH  TWICE DAILY WITH A MEAL   Microlet Lancets Misc Use as  directed to check blood sugars once daily.   Ozempic (0.25 or 0.5 MG/DOSE) 2 MG/1.5ML Sopn Generic drug: Semaglutide(0.25 or 0.5MG/DOS) Inject 0.5 mg into the skin once a week.   traMADol 50 MG tablet Commonly known as: ULTRAM Take 1 tablet (50 mg total) by mouth every 6 (six) hours as needed for moderate pain or severe pain (mild pain).          Follow-up Information     Fredirick Maudlin, MD. Schedule an appointment as soon as possible for a visit in 2 week(s).   Specialty: General Surgery Why: s/p completion thyroidectomy  Contact information: [address] [place] [address] [phone]                  Time spent on discharge management including discussion of hospital course, clinical condition, outpatient instructions, prescriptions, and follow up with the patient and members of the medical team: >30 minutes  -- Edison Simon , PA-C [facility]  10/03/2020, 8:40 AM [phone] M-F: 7am - 4pm  The patient had departed the hospital prior to my evaluation. I discussed him with Mr. Olean Ree and agree with the above documentation.

## 2020-10-03 NOTE — Discharge Instructions (Signed)

## 2020-10-05 LAB — SURGICAL PATHOLOGY

## 2020-10-09 ENCOUNTER — Telehealth: Payer: Self-pay | Admitting: *Deleted

## 2020-10-09 NOTE — Telephone Encounter (Signed)
Patient wants to know if he can get his COVID booster shot. He had surgery on 10/02/20 Dr Celine Ahr thyroidectomy

## 2020-10-09 NOTE — Telephone Encounter (Signed)
See note

## 2020-10-10 ENCOUNTER — Telehealth: Payer: Self-pay | Admitting: General Surgery

## 2020-10-10 ENCOUNTER — Telehealth: Payer: Self-pay | Admitting: *Deleted

## 2020-10-10 NOTE — Telephone Encounter (Signed)
Yes, he can have his booster. Thanks! --Lavella Hammock

## 2020-10-10 NOTE — Telephone Encounter (Signed)
I discussed the results of his pathology, benign.  He is interested in returning to work and I see no reason why he cannot do so.  I will have my staff put together a return to work note for tomorrow, per his request.

## 2020-10-10 NOTE — Telephone Encounter (Signed)
Faxed FMLA to [employer] at [phone] °

## 2020-10-10 NOTE — Telephone Encounter (Signed)
Spoke with patient to let him know that Dr.Cannon says it is OK to go ahead and have his booster. Patient verbalized understanding and that he is ready to return back to work. Advised patient to wait until his scheduled post op appointment for Dr.Cannon to clear him for that. Patient verbalized understanding and has no further questions.

## 2020-10-17 ENCOUNTER — Encounter: Payer: Self-pay | Admitting: General Surgery

## 2020-10-17 ENCOUNTER — Encounter: Payer: Self-pay | Admitting: Family Medicine

## 2020-10-17 ENCOUNTER — Other Ambulatory Visit: Payer: Self-pay

## 2020-10-17 ENCOUNTER — Ambulatory Visit (INDEPENDENT_AMBULATORY_CARE_PROVIDER_SITE_OTHER): Payer: 59 | Admitting: General Surgery

## 2020-10-17 VITALS — BP 125/83 | HR 103 | Temp 98.2°F | Ht 71.0 in | Wt 296.4 lb

## 2020-10-17 DIAGNOSIS — E890 Postprocedural hypothyroidism: Secondary | ICD-10-CM

## 2020-10-17 NOTE — Patient Instructions (Addendum)
Keep your appointment with your Dr who manages your diabetes and have him check your thyroid levels. You can stop taking the calcium. You can get Vit E or cocoa butter OTC to put on scar. You can also use sunscreen. If you have any concerns or questions, please feel free to call our office.    Thyroidectomy, Care After This sheet gives you information about how to care for yourself after your procedure. Your health care provider may also give you more specific instructions. If you have problems or questions, contact your health care provider. What can I expect after the procedure? After the procedure, it is common to have:  Mild pain in the neck or upper body, especially when swallowing.  A swollen neck.  A sore throat.  A weak or hoarse voice.  Slight tingling or numbness around your mouth, or in your fingers or toes. This may last for a day or two after surgery. This condition is caused by low levels of calcium. You may be given calcium supplements to treat it. Follow these instructions at home:  Medicines  Take over-the-counter and prescription medicines only as told by your health care provider.  Do not drive or use heavy machinery while taking prescription pain medicine.  Do not take medicines that contain aspirin and ibuprofen until your health care provider says that you can. These medicines can increase your risk of bleeding.  Take a thyroid hormone medicine as recommended by your health care provider. You will have to take this medicine for the rest of your life if your entire thyroid was removed. Eating and drinking  Start slowly with eating. You may need to have only liquids and soft foods for a few days or as directed by your health care provider.  To prevent or treat constipation while you are taking prescription pain medicine, your health care provider may recommend that you: ? Drink enough fluid to keep your urine pale yellow. ? Take over-the-counter or prescription  medicines. ? Eat foods that are high in fiber, such as fresh fruits and vegetables, whole grains, and beans. ? Limit foods that are high in fat and processed sugars, such as fried and sweet foods. Incision care  Follow instructions from your health care provider about how to take care of your incision. Make sure you: ? Wash your hands with soap and water before you change your bandage (dressing). If soap and water are not available, use hand sanitizer. ? Change your dressing as told by your health care provider. ? Leave stitches (sutures), skin glue, or adhesive strips in place. These skin closures may need to stay in place for 2 weeks or longer. If adhesive strip edges start to loosen and curl up, you may trim the loose edges. Do not remove adhesive strips completely unless your health care provider tells you to do that.  Check your incision area every day for signs of infection. Check for: ? Redness, swelling, or pain. ? Fluid or blood. ? Warmth. ? Pus or a bad smell.  Do not take baths, swim, or use a hot tub until your health care provider approves. Activity  For the first 10 days after the procedure or as instructed by your health care provider: ? Do not lift anything that is heavier than 10 lb (4.5 kg). ? Do not jog, swim, or do other strenuous exercises. ? Do not play contact sports.  Avoid sitting for a long time without moving. Get up to take short walks every 1-2  hours. This is needed to improve blood flow and breathing. Ask for help if you feel weak or unsteady.  Return to your normal activities as told by your health care provider. Ask your health care provider what activities are safe for you. General instructions  Do not use any products that contain nicotine or tobacco, such as cigarettes and e-cigarettes. These can delay healing after surgery. If you need help quitting, ask your health care provider.  Keep all follow-up visits as told by your health care provider. This  is important. Your health care provider needs to monitor the calcium level in your blood to make sure that it does not become low. Contact a health care provider if you:  Have a fever.  Have more redness, swelling, or pain around your incision area.  Have fluid or blood coming from your incision area.  Notice that your incision area feels warm to the touch.  Have pus or a bad smell coming from your incision area.  Have trouble talking.  Have nausea or vomiting for more than 2 days. Get help right away if you:  Have trouble breathing.  Have trouble swallowing.  Develop a rash.  Develop a cough that gets worse.  Notice that your speech changes, or you have hoarseness that gets worse.  Develop numbness, tingling, or muscle spasms in the arms, hands, feet, or face. Summary  After the procedure, it is common to feel mild pain in the neck or upper body, especially when swallowing.  Take medicines as told by your health care provider. These include pain medicines and thyroid hormones, if required.  Follow instructions from your health care provider about how to take care of your incision. Watch for signs of infection.  Keep all follow-up visits as told by your health care provider. This is important. Your health care provider needs to monitor the calcium level in your blood to make sure that it does not become low.  Get help right away if you develop difficulty breathing, or numbness, tingling, or muscle spasms in the arms, hands, feet, or face. This information is not intended to replace advice given to you by your health care provider. Make sure you discuss any questions you have with your health care provider. Document Revised: 12/03/2018 Document Reviewed: 08/12/2017 Elsevier Patient Education  2020 [employer].

## 2020-10-17 NOTE — Progress Notes (Signed)
Jorge Boyle is here today for a postoperative visit.  He underwent a completion thyroidectomy on October 02, 2020.  Final pathology was benign.  He states that his voice and swallowing are normal.  He denies any paresthesias or other symptoms of hypocalcemia.  He is currently taking 200 mcg of levothyroxine daily and says that he does not notice any significant change from preop.  His primary concern today is that his blood sugars have been very out of control since his operation, ranging between 300-500, despite taking his regular medications.  Today's Vitals   10/17/20 0851  BP: 125/83  Pulse: (!) 103  Temp: 98.2 F (36.8 C)  TempSrc: Oral  SpO2: 95%  Weight: 296 lb 6.4 oz (134.4 kg)  Height: 5\' 11"  (1.803 m)   Body mass index is 41.34 kg/m. Focused examination of the neck demonstrates a well approximated transverse thyroidectomy incision.  There is a normal amount of postsurgical swelling present, as well as a healing ridge beneath the surface.  There is a small amount of Dermabond still adherent to the skin.  Impression and plan: This is a 44 year old man who underwent completion thyroidectomy for compressive symptoms related to a large thyroid nodule.  He is doing well from a surgical standpoint.  He may discontinue the calcium supplementation.  He was instructed in scar care today, including massage with a topical emollient agent to improve the cosmesis and decrease swelling, as well as use of sunscreen for the next year to avoid altered pigmentation of the skin.  I encouraged him to communicate with his primary care provider, Dr. 54 regarding his blood sugar concerns.  Mr. Tapley reported that he is due to see Dr. Fortino Sic next week, at which time he will address the glucose control issues.  He should also have thyroid function tests performed to confirm that the dose of thyroid hormone replacement that I prescribed is adequate for his needs. I think Dr. Sharen Hones will be able  to manage his thyroid hormone replacement hereafter, as this was a benign process.  I will see Mr. Franzoni on an as-needed basis.

## 2020-10-25 ENCOUNTER — Other Ambulatory Visit: Payer: Self-pay

## 2020-10-25 ENCOUNTER — Ambulatory Visit: Payer: BC Managed Care – PPO | Admitting: Family Medicine

## 2020-10-25 ENCOUNTER — Encounter: Payer: Self-pay | Admitting: Family Medicine

## 2020-10-25 VITALS — BP 110/70 | HR 113 | Temp 98.4°F | Ht 70.0 in | Wt 297.0 lb

## 2020-10-25 DIAGNOSIS — E890 Postprocedural hypothyroidism: Secondary | ICD-10-CM

## 2020-10-25 DIAGNOSIS — IMO0002 Reserved for concepts with insufficient information to code with codable children: Secondary | ICD-10-CM

## 2020-10-25 DIAGNOSIS — E039 Hypothyroidism, unspecified: Secondary | ICD-10-CM | POA: Diagnosis not present

## 2020-10-25 DIAGNOSIS — E1140 Type 2 diabetes mellitus with diabetic neuropathy, unspecified: Secondary | ICD-10-CM | POA: Diagnosis not present

## 2020-10-25 DIAGNOSIS — E1165 Type 2 diabetes mellitus with hyperglycemia: Secondary | ICD-10-CM

## 2020-10-25 LAB — TSH: TSH: 4.34 u[IU]/mL (ref 0.35–4.50)

## 2020-10-25 LAB — POCT GLYCOSYLATED HEMOGLOBIN (HGB A1C): Hemoglobin A1C: 10.7 % — AB (ref 4.0–5.6)

## 2020-10-25 LAB — T4, FREE: Free T4: 1.24 ng/dL (ref 0.60–1.60)

## 2020-10-25 MED ORDER — GLIMEPIRIDE 4 MG PO TABS: 4.0000 mg | ORAL_TABLET | Freq: Every day | ORAL | 3 refills | Status: DC

## 2020-10-25 NOTE — Progress Notes (Signed)
Patient ID: Jorge Boyle, male    DOB: Oct 24, 1975, 45 y.o.   MRN: [id number]  This visit was conducted in person.  BP 110/70 (BP Location: Right Arm, Patient Position: Sitting, Cuff Size: Normal)   Pulse (!) 113   Temp 98.4 F (36.9 C) (Temporal)   Ht '5\' 10"'  (1.778 m)   Wt 297 lb (134.7 kg)   SpO2 98%   BMI 42.62 kg/m    CC: DM f/u visit  Subjective:   HPI: Jorge Boyle is a 45 y.o. male presenting on 10/25/2020 for Follow-up (X2 mos diabetes check)   Upcoming transition to first shift!   Tachycardic. Endorses good water intake, has decreased caffeine intake.   Recent complete thyroidectomy 09/2020, pathology returned benign. Now on levothyroxine 270mg daily. Previously not on thyroid replacement.   DM - does regularly check sugars 180 today, normally 200-300s. Compliant with antihyperglycemic regimen which includes: amaryl 419mdaily, metformin 100076mid, and ozempic 0.5mg78mekly. Denies low sugars or hypoglycemic symptoms. Denies paresthesias. Last diabetic eye exam DUE. Pneumovax: 2016. Prevnar: not due. Glucometer brand: contour next. . Lab Results  Component Value Date   HGBA1C 10.7 (A) 10/25/2020   Diabetic Foot Exam - Simple   Simple Foot Form Diabetic Foot exam was performed with the following findings: Yes 10/25/2020  8:24 AM  Visual Inspection No deformities, no ulcerations, no other skin breakdown bilaterally: Yes Sensation Testing Intact to touch and monofilament testing bilaterally: Yes Pulse Check Posterior Tibialis and Dorsalis pulse intact bilaterally: Yes Comments    Lab Results  Component Value Date   MICROALBUR 0.9 03/04/2019         Relevant past medical, surgical, family and social history reviewed and updated as indicated. Interim medical history since our last visit reviewed. Allergies and medications reviewed and updated. Outpatient Medications Prior to Visit  Medication Sig Dispense Refill  . atorvastatin (LIPITOR) 20 MG tablet  TAKE 1 TABLET BY MOUTH  DAILY (Patient taking differently: Take 20 mg by mouth daily.) 90 tablet 3  . Blood Glucose Monitoring Suppl (CONTOUR NEXT MONITOR) w/Device KIT Use as instructed to check blood sugar once daily. 1 kit 0  . calcium carbonate (OS-CAL - DOSED IN MG OF ELEMENTAL CALCIUM) 1250 (500 Ca) MG tablet Take 1 tablet (500 mg of elemental calcium total) by mouth 3 (three) times daily with meals. Take one tablet (500 mg) three times a day until your follow up appointment 120 tablet 0  . COLCRYS 0.6 MG tablet TAKE 1 TABLET (0.6 MG TOTAL) BY MOUTH DAILY. MAY TAKE 2 TABLETS ON THE FIRST DAY OF USE (Patient taking differently: Take 0.6 mg by mouth daily as needed (gout).) 90 tablet 0  . glucose blood (CONTOUR NEXT TEST) test strip Use as instructed to check blood sugar once daily. 100 each 3  . ibuprofen (ADVIL) 600 MG tablet Take 1 tablet (600 mg total) by mouth every 6 (six) hours as needed (for mild pain not relieved by other medications.). 30 tablet 0  . levothyroxine (SYNTHROID) 200 MCG tablet Take 1 tablet (200 mcg total) by mouth daily at 6 (six) AM. 30 tablet 3  . losartan (COZAAR) 100 MG tablet TAKE 1 TABLET BY MOUTH  DAILY 90 tablet 3  . metaxalone (SKELAXIN) 800 MG tablet Take 800 mg by mouth daily as needed for muscle spasms.     . metFORMIN (GLUCOPHAGE) 1000 MG tablet TAKE 1 TABLET BY MOUTH  TWICE DAILY WITH A MEAL 180 tablet 3  .  Microlet Lancets MISC Use as directed to check blood sugars once daily. 100 each 3  . Semaglutide,0.25 or 0.5MG/DOS, (OZEMPIC, 0.25 OR 0.5 MG/DOSE,) 2 MG/1.5ML SOPN Inject 0.5 mg into the skin once a week. 6 pen 3  . traMADol (ULTRAM) 50 MG tablet Take 1 tablet (50 mg total) by mouth every 6 (six) hours as needed for moderate pain or severe pain (mild pain). 20 tablet 0  . glimepiride (AMARYL) 2 MG tablet TAKE 1 TABLET BY MOUTH  DAILY WITH BREAKFAST (Patient taking differently: Take 2 mg by mouth daily with breakfast.) 90 tablet 3   No  facility-administered medications prior to visit.     Per HPI unless specifically indicated in ROS section below Review of Systems Objective:  BP 110/70 (BP Location: Right Arm, Patient Position: Sitting, Cuff Size: Normal)   Pulse (!) 113   Temp 98.4 F (36.9 C) (Temporal)   Ht '5\' 10"'  (1.778 m)   Wt 297 lb (134.7 kg)   SpO2 98%   BMI 42.62 kg/m   Wt Readings from Last 3 Encounters:  10/25/20 297 lb (134.7 kg)  10/17/20 296 lb 6.4 oz (134.4 kg)  10/02/20 300 lb (136.1 kg)      Physical Exam Vitals and nursing note reviewed.  Constitutional:      General: He is not in acute distress.    Appearance: He is well-developed and well-nourished.  HENT:     Head: Normocephalic and atraumatic.     Mouth/Throat:     Mouth: Oropharynx is clear and moist.  Eyes:     General: No scleral icterus.    Extraocular Movements: Extraocular movements intact and EOM normal.     Conjunctiva/sclera: Conjunctivae normal.     Pupils: Pupils are equal, round, and reactive to light.  Neck:     Comments: Transverse thyroid incision from recent thyroidectomy Cardiovascular:     Rate and Rhythm: Regular rhythm. Tachycardia present.     Pulses: Normal pulses and intact distal pulses.     Heart sounds: Normal heart sounds. No murmur heard.   Pulmonary:     Effort: Pulmonary effort is normal. No respiratory distress.     Breath sounds: Normal breath sounds. No wheezing, rhonchi or rales.  Musculoskeletal:        General: No edema.     Cervical back: Normal range of motion and neck supple. No rigidity.     Right lower leg: No edema.     Left lower leg: No edema.     Comments: See HPI for foot exam if done  Lymphadenopathy:     Cervical: No cervical adenopathy.  Skin:    General: Skin is warm and dry.     Findings: No rash.  Psychiatric:        Mood and Affect: Mood and affect and mood normal.        Behavior: Behavior normal.       Results for orders placed or performed in visit on  10/25/20  POCT glycosylated hemoglobin (Hb A1C)  Result Value Ref Range   Hemoglobin A1C 10.7 (A) 4.0 - 5.6 %   HbA1c POC (<> result, manual entry)     HbA1c, POC (prediabetic range)     HbA1c, POC (controlled diabetic range)     Assessment & Plan:  This visit occurred during the SARS-CoV-2 public health emergency.  Safety protocols were in place, including screening questions prior to the visit, additional usage of staff PPE, and extensive cleaning of exam room  while observing appropriate contact time as indicated for disinfecting solutions.   Problem List Items Addressed This Visit    Type 2 diabetes mellitus, uncontrolled, with neuropathy ([place]) - Primary    Recently elevated readings after thyroidectomy.  Slowly improving since increasing amaryl to 56m with breakfast.  He has new 3 mo supply of ozempic 0.542mat home, if persistently elevated, next step will be to increase dose to 88m37meekly. He will keep us [place] with sugar levels.       Relevant Medications   glimepiride (AMARYL) 4 MG tablet   Other Relevant Orders   POCT glycosylated hemoglobin (Hb A1C) (Completed)   S/P total thyroidectomy   Relevant Orders   TSH   T4, free   Acquired hypothyroidism    Update TFTs after 3 wks of levothyroxine 200m73maily.  Tachycardia noted - ensure not hyperthyroid.  He notes change from heat intolerance to cold intolerance since thyroidectomy.       Relevant Orders   TSH   T4, free       Meds ordered this encounter  Medications  . glimepiride (AMARYL) 4 MG tablet    Sig: Take 1 tablet (4 mg total) by mouth daily with breakfast.    Dispense:  90 tablet    Refill:  3   Orders Placed This Encounter  Procedures  . TSH  . T4, free  . POCT glycosylated hemoglobin (Hb A1C)    Follow up plan: Return in about 6 weeks (around 12/06/2020), or if symptoms worsen or fail to improve, for follow up visit.  JaviRia Bush

## 2020-10-25 NOTE — Assessment & Plan Note (Signed)
Recently elevated readings after thyroidectomy.  Slowly improving since increasing amaryl to 4mg  with breakfast.  He has new 3 mo supply of ozempic 0.5mg  at home, if persistently elevated, next step will be to increase dose to 1mg  weekly. He will keep updated with sugar levels.

## 2020-10-25 NOTE — Assessment & Plan Note (Signed)
Update TFTs after 3 wks of levothyroxine daily.  Tachycardia noted - ensure not hyperthyroid.  He notes change from heat intolerance to cold intolerance since thyroidectomy.

## 2020-10-25 NOTE — Patient Instructions (Addendum)
Labs today to check thyroid levels  A1c today.  We will be in touch with results and plan.  For now, continue amaryl 4mg  daily - new dose at pharmacy. You may double up on what you have until you run out.  Let know when running low on ozempic for refill.  Return in 6-8 weeks for follow up visit.

## 2020-11-03 ENCOUNTER — Encounter: Payer: Self-pay | Admitting: Family Medicine

## 2020-11-07 MED ORDER — OZEMPIC (1 MG/DOSE) 4 MG/3ML ~~LOC~~ SOPN: 1.0000 mg | PEN_INJECTOR | SUBCUTANEOUS | 3 refills | Status: DC

## 2020-11-10 ENCOUNTER — Encounter: Payer: Self-pay | Admitting: Family Medicine

## 2020-12-06 ENCOUNTER — Other Ambulatory Visit: Payer: Self-pay

## 2020-12-06 ENCOUNTER — Encounter: Payer: Self-pay | Admitting: Family Medicine

## 2020-12-06 ENCOUNTER — Ambulatory Visit: Payer: BC Managed Care – PPO | Admitting: Family Medicine

## 2020-12-06 VITALS — BP 122/72 | HR 104 | Temp 98.2°F | Ht 70.0 in | Wt 292.3 lb

## 2020-12-06 DIAGNOSIS — E1140 Type 2 diabetes mellitus with diabetic neuropathy, unspecified: Secondary | ICD-10-CM | POA: Diagnosis not present

## 2020-12-06 DIAGNOSIS — E1165 Type 2 diabetes mellitus with hyperglycemia: Secondary | ICD-10-CM

## 2020-12-06 DIAGNOSIS — E039 Hypothyroidism, unspecified: Secondary | ICD-10-CM

## 2020-12-06 DIAGNOSIS — IMO0002 Reserved for concepts with insufficient information to code with codable children: Secondary | ICD-10-CM

## 2020-12-06 MED ORDER — GLIMEPIRIDE 2 MG PO TABS: 2.0000 mg | ORAL_TABLET | Freq: Every day | ORAL | 1 refills | Status: DC

## 2020-12-06 NOTE — Patient Instructions (Addendum)
Schedule eye exam.  Sugars are doing better! Drop amaryl (glimepiride) to 2mg  daily. Cut it in half, new dose will be 2mg   Labs today - fructosamine level.

## 2020-12-06 NOTE — Progress Notes (Signed)
Patient ID: Jorge Boyle, male    DOB: 09/22/1976, 45 y.o.   MRN: [id number]  This visit was conducted in person.  BP 122/72   Pulse (!) 104   Temp 98.2 F (36.8 C) (Temporal)   Ht '5\' 10"'  (1.778 m)   Wt 292 lb 5 oz (132.6 kg)   SpO2 96%   BMI 41.94 kg/m    CC: 6 wk f/u visit  Subjective:   HPI: Jorge Boyle is a 45 y.o. male presenting on 12/06/2020 for Follow-up (Here for 6-8 wk f/u.)   See prior note for details.  Recently started first shift.   Recent complete thyroidectomy 09/2020 with benign pathology. Now on levothyroxine 253mg daily. Lab Results  Component Value Date   TSH 4.34 10/25/2020    DM - does regularly check sugars 70s-140s. Compliant with antihyperglycemic regimen which includes: amaryl 442mdaily, metformin 100061mid, ozempic 1mg27mekly (increased last month) - he's been taking 0.5mg 47mhots once weekly. Denies low sugars or hypoglycemic symptoms. Denies paresthesias. Last diabetic eye exam DUE. Pneumovax: 2016. Prevnar: not due. Glucometer brand: contour next.  Lab Results  Component Value Date   HGBA1C 10.7 (A) 10/25/2020   Diabetic Foot Exam - Simple   No data filed    Lab Results  Component Value Date   MICROALBUR 0.9 03/04/2019       Relevant past medical, surgical, family and social history reviewed and updated as indicated. Interim medical history since our last visit reviewed. Allergies and medications reviewed and updated. Outpatient Medications Prior to Visit  Medication Sig Dispense Refill  . atorvastatin (LIPITOR) 20 MG tablet TAKE 1 TABLET BY MOUTH  DAILY (Patient taking differently: Take 20 mg by mouth daily.) 90 tablet 3  . Blood Glucose Monitoring Suppl (CONTOUR NEXT MONITOR) w/Device KIT Use as instructed to check blood sugar once daily. 1 kit 0  . COLCRYS 0.6 MG tablet TAKE 1 TABLET (0.6 MG TOTAL) BY MOUTH DAILY. MAY TAKE 2 TABLETS ON THE FIRST DAY OF USE (Patient taking differently: Take 0.6 mg by mouth daily as  needed (gout).) 90 tablet 0  . glucose blood (CONTOUR NEXT TEST) test strip Use as instructed to check blood sugar once daily. 100 each 3  . ibuprofen (ADVIL) 600 MG tablet Take 1 tablet (600 mg total) by mouth every 6 (six) hours as needed (for mild pain not relieved by other medications.). 30 tablet 0  . levothyroxine (SYNTHROID) 200 MCG tablet Take 1 tablet (200 mcg total) by mouth daily at 6 (six) AM. 30 tablet 3  . losartan (COZAAR) 100 MG tablet TAKE 1 TABLET BY MOUTH  DAILY 90 tablet 3  . metaxalone (SKELAXIN) 800 MG tablet Take 800 mg by mouth daily as needed for muscle spasms.     . metFORMIN (GLUCOPHAGE) 1000 MG tablet TAKE 1 TABLET BY MOUTH  TWICE DAILY WITH A MEAL 180 tablet 3  . Microlet Lancets MISC Use as directed to check blood sugars once daily. 100 each 3  . Semaglutide, 1 MG/DOSE, (OZEMPIC, 1 MG/DOSE,) 4 MG/3ML SOPN Inject 1 mg into the skin once a week. 9 mL 3  . glimepiride (AMARYL) 4 MG tablet Take 1 tablet (4 mg total) by mouth daily with breakfast. 90 tablet 3  . calcium carbonate (OS-CAL - DOSED IN MG OF ELEMENTAL CALCIUM) 1250 (500 Ca) MG tablet Take 1 tablet (500 mg of elemental calcium total) by mouth 3 (three) times daily with meals. Take one tablet (500  mg) three times a day until your follow up appointment 120 tablet 0  . traMADol (ULTRAM) 50 MG tablet Take 1 tablet (50 mg total) by mouth every 6 (six) hours as needed for moderate pain or severe pain (mild pain). 20 tablet 0   No facility-administered medications prior to visit.     Per HPI unless specifically indicated in ROS section below Review of Systems Objective:  BP 122/72   Pulse (!) 104   Temp 98.2 F (36.8 C) (Temporal)   Ht '5\' 10"'  (1.778 m)   Wt 292 lb 5 oz (132.6 kg)   SpO2 96%   BMI 41.94 kg/m   Wt Readings from Last 3 Encounters:  12/06/20 292 lb 5 oz (132.6 kg)  10/25/20 297 lb (134.7 kg)  10/17/20 296 lb 6.4 oz (134.4 kg)      Physical Exam Vitals and nursing note reviewed.   Constitutional:      Appearance: Normal appearance. He is obese. He is not ill-appearing.  Cardiovascular:     Rate and Rhythm: Normal rate and regular rhythm.     Pulses: Normal pulses.     Heart sounds: Normal heart sounds. No murmur heard.   Pulmonary:     Effort: Pulmonary effort is normal. No respiratory distress.     Breath sounds: Normal breath sounds. No wheezing, rhonchi or rales.  Musculoskeletal:     Right lower leg: No edema.     Left lower leg: No edema.  Skin:    General: Skin is warm and dry.     Findings: No rash.  Neurological:     Mental Status: He is alert.  Psychiatric:        Mood and Affect: Mood normal.        Behavior: Behavior normal.       Results for orders placed or performed in visit on 10/25/20  TSH  Result Value Ref Range   TSH 4.34 0.35 - 4.50 uIU/mL  T4, free  Result Value Ref Range   Free T4 1.24 0.60 - 1.60 ng/dL  POCT glycosylated hemoglobin (Hb A1C)  Result Value Ref Range   Hemoglobin A1C 10.7 (A) 4.0 - 5.6 %   HbA1c POC (<> result, manual entry)     HbA1c, POC (prediabetic range)     HbA1c, POC (controlled diabetic range)     Assessment & Plan:  This visit occurred during the SARS-CoV-2 public health emergency.  Safety protocols were in place, including screening questions prior to the visit, additional usage of staff PPE, and extensive cleaning of exam room while observing appropriate contact time as indicated for disinfecting solutions.   Problem List Items Addressed This Visit    Type 2 diabetes mellitus, uncontrolled, with neuropathy ([place]) - Primary    Chronic, improving readings based on recall cbg's.  Notes some low 70s - and anticipate continued improvement so will decrease glimepiride to 66m daily.  RTC 3 mo DM f/u visit.  Check fructosamine today - anticipate much better control than latest A1c.       Relevant Medications   glimepiride (AMARYL) 2 MG tablet   Other Relevant Orders   Fructosamine   Acquired  hypothyroidism    Continue levothyroxine 2020m daily.           Meds ordered this encounter  Medications  . glimepiride (AMARYL) 2 MG tablet    Sig: Take 1 tablet (2 mg total) by mouth daily with breakfast.    Dispense:  90 tablet  Refill:  1    Note new dose, hold until next refill due   Orders Placed This Encounter  Procedures  . Fructosamine    Patient Instructions  Schedule eye exam.  Sugars are doing better! Drop amaryl (glimepiride) to 14m daily. Cut it in half, new dose will be 240m Labs today - fructosamine level.    Follow up plan: Return in about 3 months (around 03/05/2021), or if symptoms worsen or fail to improve, for follow up visit.  JaRia BushMD

## 2020-12-06 NOTE — Assessment & Plan Note (Signed)
Chronic, improving readings based on recall cbg's.  Notes some low 70s - and anticipate continued improvement so will decrease glimepiride to 2mg  daily.  RTC 3 mo DM f/u visit.  Check fructosamine today - anticipate much better control than latest A1c.

## 2020-12-06 NOTE — Assessment & Plan Note (Signed)
Continue levothyroxine 200mcg daily

## 2020-12-10 ENCOUNTER — Other Ambulatory Visit: Payer: Self-pay | Admitting: Family Medicine

## 2020-12-10 LAB — FRUCTOSAMINE: Fructosamine: 245 umol/L (ref 205–285)

## 2021-01-20 ENCOUNTER — Encounter: Payer: Self-pay | Admitting: Family Medicine

## 2021-01-22 MED ORDER — LEVOTHYROXINE SODIUM 200 MCG PO TABS: 200.0000 ug | ORAL_TABLET | Freq: Every day | ORAL | 2 refills | Status: DC

## 2021-01-22 NOTE — Telephone Encounter (Signed)
E-scribed refill to [facility]. 

## 2021-01-31 ENCOUNTER — Encounter: Payer: Self-pay | Admitting: Family Medicine

## 2021-01-31 NOTE — Telephone Encounter (Signed)
Plz triage pt.  °

## 2021-02-05 NOTE — Telephone Encounter (Signed)
Just saw message today. Spoke to patient and was advised that he is doing fine today. Patient stated that he has had a problem with swollen glands off and on for about 3 weeks especially on the right side of his neck. Patient stated sometimes he has a problem swallowing food that is not soft. Patient stated the problem with his glands come and go and not all the time. Patient denies a cough, sore throat, fever or any other symptoms. Patient stated that he is on vacation this week and would like to be seen this week if possible.  Advised patient that it looks like he went to the ER 02/03/21. Patient stated that he did not go but his brother went.

## 2021-02-05 NOTE — Telephone Encounter (Signed)
Seeing if pt can come in tomorrow at 8:30am

## 2021-02-06 ENCOUNTER — Telehealth: Payer: Self-pay

## 2021-02-06 ENCOUNTER — Encounter: Payer: Self-pay | Admitting: Family Medicine

## 2021-02-06 ENCOUNTER — Other Ambulatory Visit: Payer: Self-pay

## 2021-02-06 ENCOUNTER — Ambulatory Visit: Payer: BC Managed Care – PPO | Admitting: Family Medicine

## 2021-02-06 DIAGNOSIS — R591 Generalized enlarged lymph nodes: Secondary | ICD-10-CM | POA: Diagnosis not present

## 2021-02-06 MED ORDER — CYCLOBENZAPRINE HCL 10 MG PO TABS: 5.0000 mg | ORAL_TABLET | Freq: Two times a day (BID) | ORAL | 1 refills | Status: DC | PRN

## 2021-02-06 MED ORDER — AZITHROMYCIN 250 MG PO TABS: ORAL_TABLET | ORAL | 0 refills | Status: DC

## 2021-02-06 NOTE — Telephone Encounter (Signed)
[place] Night - Client Nonclinical Telephone Record AccessNurse Client [facility] Night - Client Client Site [place] Physician Ria Bush - MD Contact Type Call Who Is Calling Patient / Member / Family / Caregiver Caller Name Lonie Newsham Caller Phone Number [phone] Patient Name Jorge Boyle Patient DOB 11/03/1975 Call Type Message Only Information Provided Reason for Call Request to Schedule Office Appointment Initial Comment Caller rcvd a msg on MyChart from provider asking PT coming in today at 830 a.m. Caller rcvd msg late last night. Additional Comment Provided office hours. Caller declined triage. Disp. Time Disposition Final User 02/06/2021 8:02:52 AM General Information Provided Yes Lopez-Craine, Dahlia Call Closed By: Iver Nestle Transaction Date/Time: 02/06/2021 7:59:19 AM (ET)

## 2021-02-06 NOTE — Patient Instructions (Signed)
I think swelling is coming from swollen lymph nodes on right side, possibly from dental issues.  We were unable to do strep test today.  Take antibiotic sent to pharmacy, follow up with dentist.  Let us know if not improving with treatment.

## 2021-02-06 NOTE — Telephone Encounter (Signed)
Pt already has appt with Dr Darnell Level today at 8:30.

## 2021-02-06 NOTE — Assessment & Plan Note (Addendum)
R sided upper cervical and tonsillar LAD associated with choking sensation. Check RST r/o strep throat.  Anticipate LAD coming from poor dentition. Will Rx zpack course (PCN allergy), encourage dental f/u.  Unable to complete RST due to strong gag reflex.

## 2021-02-06 NOTE — Progress Notes (Signed)
Patient ID: Jorge Boyle, male    DOB: [birth year], 45 y.o.   MRN: [id number]  This visit was conducted in person.  BP 122/82   Pulse 88   Temp (!) 97.5 F (36.4 C) (Temporal)   Ht '5\' 10"'  (1.778 m)   Wt 299 lb 2 oz (135.7 kg)   SpO2 98%   BMI 42.92 kg/m    CC: swollen glands  Subjective:   HPI: Jorge Boyle is a 45 y.o. male presenting on 02/06/2021 for Edema (C/o gland swelling.  Started 2-3 wks ago.  Feels like something in his throat. )   R neck swelling off and on for 3 weeks. This is associated with choking feeling associated with gagging. Family has noted R neck swelling. Notes occasional dysphagia.   No fevers/chills, ST, PNDrainage, tongue or lip swelling, dyspnea, cough or congestion. No allergy symptoms.  No hoarse voice, no trismus.   Known poor dentition s/p multiple teeth removed. No recent dental pain.  Has dental insurance, has not seen dentist in over a year. Discussed dental hygiene  H/o R thyroid lobectomy 2005 H/o completion thyroidectomy 09/2020 Celine Ahr) due to L thyroid nodule compressive symptoms.   Non smoker. Rare alcohol.      Relevant past medical, surgical, family and social history reviewed and updated as indicated. Interim medical history since our last visit reviewed. Allergies and medications reviewed and updated. Outpatient Medications Prior to Visit  Medication Sig Dispense Refill  . atorvastatin (LIPITOR) 20 MG tablet TAKE 1 TABLET BY MOUTH  DAILY (Patient taking differently: Take 20 mg by mouth daily.) 90 tablet 3  . Blood Glucose Monitoring Suppl (CONTOUR NEXT MONITOR) w/Device KIT Use as instructed to check blood sugar once daily. 1 kit 0  . COLCRYS 0.6 MG tablet TAKE 1 TABLET (0.6 MG TOTAL) BY MOUTH DAILY. MAY TAKE 2 TABLETS ON THE FIRST DAY OF USE (Patient taking differently: Take 0.6 mg by mouth daily as needed (gout).) 90 tablet 0  . glucose blood (CONTOUR NEXT TEST) test strip Use as instructed to check blood sugar once  daily. 100 each 3  . ibuprofen (ADVIL) 600 MG tablet Take 1 tablet (600 mg total) by mouth every 6 (six) hours as needed (for mild pain not relieved by other medications.). 30 tablet 0  . levothyroxine (SYNTHROID) 200 MCG tablet Take 1 tablet (200 mcg total) by mouth daily at 6 (six) AM. 90 tablet 2  . losartan (COZAAR) 100 MG tablet TAKE 1 TABLET BY MOUTH  DAILY 90 tablet 3  . metFORMIN (GLUCOPHAGE) 1000 MG tablet TAKE 1 TABLET BY MOUTH  TWICE DAILY WITH A MEAL 180 tablet 3  . Microlet Lancets MISC Use as directed to check blood sugars once daily. 100 each 3  . Semaglutide, 1 MG/DOSE, (OZEMPIC, 1 MG/DOSE,) 4 MG/3ML SOPN Inject 1 mg into the skin once a week. 9 mL 3  . metaxalone (SKELAXIN) 800 MG tablet Take 800 mg by mouth daily as needed for muscle spasms.      No facility-administered medications prior to visit.     Per HPI unless specifically indicated in ROS section below Review of Systems Objective:  BP 122/82   Pulse 88   Temp (!) 97.5 F (36.4 C) (Temporal)   Ht '5\' 10"'  (1.778 m)   Wt 299 lb 2 oz (135.7 kg)   SpO2 98%   BMI 42.92 kg/m   Wt Readings from Last 3 Encounters:  02/06/21 299 lb 2 oz (135.7 kg)  12/06/20 292 lb 5 oz (132.6 kg)  10/25/20 297 lb (134.7 kg)      Physical Exam Vitals and nursing note reviewed.  Constitutional:      Appearance: Normal appearance. He is obese. He is not ill-appearing.  HENT:     Head: Normocephalic and atraumatic.     Right Ear: Tympanic membrane, ear canal and external ear normal. There is no impacted cerumen.     Left Ear: Tympanic membrane, ear canal and external ear normal. There is no impacted cerumen.     Nose: Nose normal. No congestion.     Mouth/Throat:     Mouth: Mucous membranes are moist.     Dentition: Abnormal dentition. Gingival swelling present. No dental tenderness or dental abscesses.     Tongue: No lesions.     Pharynx: Oropharynx is clear. No oropharyngeal exudate.     Comments:  Poor dentition, several  teeth have been removed, R upper teeth with possible caries Difficult to evaluate oropharynx due to strong gag reflex Eyes:     Extraocular Movements: Extraocular movements intact.     Pupils: Pupils are equal, round, and reactive to light.  Musculoskeletal:     Cervical back: Normal range of motion and neck supple. No rigidity.  Lymphadenopathy:     Head:     Right side of head: Submandibular and tonsillar adenopathy present. No submental, preauricular or posterior auricular adenopathy.     Left side of head: No submental, submandibular, tonsillar, preauricular or posterior auricular adenopathy.     Cervical: Cervical adenopathy present.     Right cervical: Superficial cervical adenopathy present. No posterior cervical adenopathy.    Left cervical: No superficial, deep or posterior cervical adenopathy.  Neurological:     Mental Status: He is alert.  Psychiatric:        Mood and Affect: Mood normal.        Behavior: Behavior normal.       Results for orders placed or performed in visit on 12/06/20  Fructosamine  Result Value Ref Range   Fructosamine 245 205 - 285 umol/L   Assessment & Plan:  This visit occurred during the SARS-CoV-2 public health emergency.  Safety protocols were in place, including screening questions prior to the visit, additional usage of staff PPE, and extensive cleaning of exam room while observing appropriate contact time as indicated for disinfecting solutions.   Problem List Items Addressed This Visit    Lymphadenopathy of head and neck    R sided upper cervical and tonsillar LAD associated with choking sensation. Check RST r/o strep throat.  Anticipate LAD coming from poor dentition. Will Rx zpack course (PCN allergy), encourage dental f/u.  Unable to complete RST due to strong gag reflex.           Meds ordered this encounter  Medications  . cyclobenzaprine (FLEXERIL) 10 MG tablet    Sig: Take 0.5-1 tablets (5-10 mg total) by mouth 2 (two) times  daily as needed for muscle spasms (sedation precautions).    Dispense:  30 tablet    Refill:  1  . azithromycin (ZITHROMAX) 250 MG tablet    Sig: Take two tablets on day one followed by one tablet on days 2-5    Dispense:  6 each    Refill:  0   No orders of the defined types were placed in this encounter.   Follow up plan: Return if symptoms worsen or fail to improve.  Ria Bush, MD

## 2021-02-06 NOTE — Telephone Encounter (Signed)
Pt has been added to schedule ?

## 2021-03-05 ENCOUNTER — Other Ambulatory Visit: Payer: Self-pay

## 2021-03-05 ENCOUNTER — Ambulatory Visit: Payer: BC Managed Care – PPO | Admitting: Family Medicine

## 2021-03-05 ENCOUNTER — Encounter: Payer: Self-pay | Admitting: Family Medicine

## 2021-03-05 VITALS — BP 120/78 | HR 100 | Temp 98.2°F | Ht 70.0 in | Wt 297.4 lb

## 2021-03-05 DIAGNOSIS — E1140 Type 2 diabetes mellitus with diabetic neuropathy, unspecified: Secondary | ICD-10-CM | POA: Diagnosis not present

## 2021-03-05 DIAGNOSIS — K760 Fatty (change of) liver, not elsewhere classified: Secondary | ICD-10-CM | POA: Diagnosis not present

## 2021-03-05 DIAGNOSIS — E1165 Type 2 diabetes mellitus with hyperglycemia: Secondary | ICD-10-CM | POA: Diagnosis not present

## 2021-03-05 DIAGNOSIS — K5790 Diverticulosis of intestine, part unspecified, without perforation or abscess without bleeding: Secondary | ICD-10-CM | POA: Insufficient documentation

## 2021-03-05 DIAGNOSIS — J069 Acute upper respiratory infection, unspecified: Secondary | ICD-10-CM

## 2021-03-05 DIAGNOSIS — IMO0002 Reserved for concepts with insufficient information to code with codable children: Secondary | ICD-10-CM

## 2021-03-05 LAB — POCT GLYCOSYLATED HEMOGLOBIN (HGB A1C): Hemoglobin A1C: 8 % — AB (ref 4.0–5.6)

## 2021-03-05 MED ORDER — PIOGLITAZONE HCL 15 MG PO TABS: 15.0000 mg | ORAL_TABLET | Freq: Every day | ORAL | 6 refills | Status: DC

## 2021-03-05 MED ORDER — IBUPROFEN 600 MG PO TABS: 600.0000 mg | ORAL_TABLET | Freq: Two times a day (BID) | ORAL | 0 refills | Status: DC | PRN

## 2021-03-05 MED ORDER — BENZONATATE 100 MG PO CAPS: 100.0000 mg | ORAL_CAPSULE | Freq: Three times a day (TID) | ORAL | 0 refills | Status: DC | PRN

## 2021-03-05 MED ORDER — CONTOUR NEXT TEST VI STRP: ORAL_STRIP | 3 refills | Status: DC

## 2021-03-05 NOTE — Assessment & Plan Note (Addendum)
Anticipate viral URI with cough - no signs of bacterial infection at this time. Reviewed supportive care to date, add tessalon perls. Update if not improving with treatment. Pt agrees with plan.

## 2021-03-05 NOTE — Patient Instructions (Addendum)
I think you have upper respiratory infection, likely viral Push fluids and rest May take tylenol, ibuprofen, tessalon perls for cough.  For diabetes - sugars are improved however remain too high - work on low sugar low carb diet and start 3rd diabetes medicine called actos 15mg  once daily.  Return in 3 months for diabetes follow up visit.

## 2021-03-05 NOTE — Progress Notes (Signed)
Patient ID: Jorge Boyle, male    DOB: 06-Jan-1976, 45 y.o.   MRN: [id number]  This visit was conducted in person.  BP 120/78   Pulse 100   Temp 98.2 F (36.8 C) (Temporal)   Ht 5' 10" (1.778 m)   Wt 297 lb 7 oz (134.9 kg)   SpO2 96%   BMI 42.68 kg/m    CC: 3 mo DM f/u visit  Subjective:   HPI: Jorge Boyle is a 45 y.o. male presenting on 03/05/2021 for Diabetes (Here for 3 mo f/u.) and Cough (C/o cough.  Feels pain in deep chest when he coughs.  Started about 2 days ago.  Brother recently had bronchitis. )   2 d h/o deep nonproductive cough. Feels cough in chest. Some nasal >chest congestion.  No fevers/chills, ear pain, ST, abd pain, nausea, diarrhea, body aches, wheezing or dyspnea. Hasn't tried anything for cough. Brother recently with bronchitis.  Non smoker No h/o asthma.   DM - does regularly check sugars 140-160 after meal. Compliant with antihyperglycemic regimen which includes: metformin 1067m bid, ozempic 152mweekly. Denies low sugars or hypoglycemic symptoms. Some low symptoms when he misses breakfast. Denies paresthesias. Last diabetic eye exam DUE - earliest available 08/2021. Glucometer brand: contour. DSME: declined. Lab Results  Component Value Date   HGBA1C 8.0 (A) 03/05/2021   Diabetic Foot Exam - Simple   No data filed    Lab Results  Component Value Date   MICROALBUR 0.9 03/04/2019        Relevant past medical, surgical, family and social history reviewed and updated as indicated. Interim medical history since our last visit reviewed. Allergies and medications reviewed and updated. Outpatient Medications Prior to Visit  Medication Sig Dispense Refill  . atorvastatin (LIPITOR) 20 MG tablet TAKE 1 TABLET BY MOUTH  DAILY (Patient taking differently: Take 20 mg by mouth daily.) 90 tablet 3  . Blood Glucose Monitoring Suppl (CONTOUR NEXT MONITOR) w/Device KIT Use as instructed to check blood sugar once daily. 1 kit 0  . clindamycin  (CLEOCIN) 150 MG capsule Take by mouth.    . COLCRYS 0.6 MG tablet TAKE 1 TABLET (0.6 MG TOTAL) BY MOUTH DAILY. MAY TAKE 2 TABLETS ON THE FIRST DAY OF USE (Patient taking differently: Take 0.6 mg by mouth daily as needed (gout).) 90 tablet 0  . cyclobenzaprine (FLEXERIL) 10 MG tablet Take 0.5-1 tablets (5-10 mg total) by mouth 2 (two) times daily as needed for muscle spasms (sedation precautions). 30 tablet 1  . glucose blood (CONTOUR NEXT TEST) test strip Use as instructed to check blood sugar once daily. 100 each 3  . levothyroxine (SYNTHROID) 200 MCG tablet Take 1 tablet (200 mcg total) by mouth daily at 6 (six) AM. 90 tablet 2  . losartan (COZAAR) 100 MG tablet TAKE 1 TABLET BY MOUTH  DAILY 90 tablet 3  . metFORMIN (GLUCOPHAGE) 1000 MG tablet TAKE 1 TABLET BY MOUTH  TWICE DAILY WITH A MEAL 180 tablet 3  . Microlet Lancets MISC Use as directed to check blood sugars once daily. 100 each 3  . Semaglutide, 1 MG/DOSE, (OZEMPIC, 1 MG/DOSE,) 4 MG/3ML SOPN Inject 1 mg into the skin once a week. 9 mL 3  . ibuprofen (ADVIL) 600 MG tablet Take 1 tablet (600 mg total) by mouth every 6 (six) hours as needed (for mild pain not relieved by other medications.). 30 tablet 0  . azithromycin (ZITHROMAX) 250 MG tablet Take two tablets on day  one followed by one tablet on days 2-5 6 each 0   No facility-administered medications prior to visit.     Per HPI unless specifically indicated in ROS section below Review of Systems Objective:  BP 120/78   Pulse 100   Temp 98.2 F (36.8 C) (Temporal)   Ht 5' 10" (1.778 m)   Wt 297 lb 7 oz (134.9 kg)   SpO2 96%   BMI 42.68 kg/m   Wt Readings from Last 3 Encounters:  03/05/21 297 lb 7 oz (134.9 kg)  02/06/21 299 lb 2 oz (135.7 kg)  12/06/20 292 lb 5 oz (132.6 kg)      Physical Exam Vitals and nursing note reviewed.  Constitutional:      Appearance: Normal appearance. He is not ill-appearing.  Cardiovascular:     Rate and Rhythm: Normal rate and regular  rhythm.     Pulses: Normal pulses.     Heart sounds: Normal heart sounds. No murmur heard.   Pulmonary:     Effort: Pulmonary effort is normal. No respiratory distress.     Breath sounds: Normal breath sounds. No wheezing, rhonchi or rales.  Musculoskeletal:     Right lower leg: No edema.     Left lower leg: No edema.  Skin:    General: Skin is warm and dry.     Findings: No rash.  Neurological:     Mental Status: He is alert.  Psychiatric:        Mood and Affect: Mood normal.        Behavior: Behavior normal.       Results for orders placed or performed in visit on 03/05/21  POCT glycosylated hemoglobin (Hb A1C)  Result Value Ref Range   Hemoglobin A1C 8.0 (A) 4.0 - 5.6 %   HbA1c POC (<> result, manual entry)     HbA1c, POC (prediabetic range)     HbA1c, POC (controlled diabetic range)     Assessment & Plan:  This visit occurred during the SARS-CoV-2 public health emergency.  Safety protocols were in place, including screening questions prior to the visit, additional usage of staff PPE, and extensive cleaning of exam room while observing appropriate contact time as indicated for disinfecting solutions.   Problem List Items Addressed This Visit    Type 2 diabetes mellitus, uncontrolled, with neuropathy ([place]) - Primary    Chronic, improving however remaining above goal - will add actos 6m daily. Continue ozempic 139mweekly and metformin 100070mid. He declines DSME referral given concern over time to schedule this.       Relevant Medications   pioglitazone (ACTOS) 15 MG tablet   Other Relevant Orders   POCT glycosylated hemoglobin (Hb A1C) (Completed)   Viral URI with cough    Anticipate viral URI with cough - no signs of bacterial infection at this time. Reviewed supportive care to date, add tessalon perls. Update if not improving with treatment. Pt agrees with plan.       Relevant Medications   clindamycin (CLEOCIN) 150 MG capsule   Hepatic steatosis    Will  benefit from actos.           Meds ordered this encounter  Medications  . ibuprofen (ADVIL) 600 MG tablet    Sig: Take 1 tablet (600 mg total) by mouth 2 (two) times daily as needed for moderate pain.    Dispense:  30 tablet    Refill:  0  . benzonatate (TESSALON) 100 MG capsule  Sig: Take 1 capsule (100 mg total) by mouth 3 (three) times daily as needed for cough (swallow, don't chew).    Dispense:  30 capsule    Refill:  0  . pioglitazone (ACTOS) 15 MG tablet    Sig: Take 1 tablet (15 mg total) by mouth daily.    Dispense:  30 tablet    Refill:  6   Orders Placed This Encounter  Procedures  . POCT glycosylated hemoglobin (Hb A1C)    Patient Instructions  I think you have upper respiratory infection, likely viral Push fluids and rest May take tylenol, ibuprofen, tessalon perls for cough.  For diabetes - sugars are improved however remain too high - work on low sugar low carb diet and start 3rd diabetes medicine called actos 27m once daily.  Return in 3 months for diabetes follow up visit.   Follow up plan: Return in about 3 months (around 06/05/2021), or if symptoms worsen or fail to improve, for follow up visit.  JRia Bush MD

## 2021-03-05 NOTE — Assessment & Plan Note (Signed)
Chronic, improving however remaining above goal - will add actos 15mg  daily. Continue ozempic 1mg  weekly and metformin 1000mg  bid. He declines DSME referral given concern over time to schedule this.

## 2021-03-05 NOTE — Assessment & Plan Note (Signed)
Will benefit from actos.

## 2021-03-23 DIAGNOSIS — J40 Bronchitis, not specified as acute or chronic: Secondary | ICD-10-CM | POA: Diagnosis not present

## 2021-03-23 DIAGNOSIS — Z03818 Encounter for observation for suspected exposure to other biological agents ruled out: Secondary | ICD-10-CM | POA: Diagnosis not present

## 2021-03-23 DIAGNOSIS — R059 Cough, unspecified: Secondary | ICD-10-CM | POA: Diagnosis not present

## 2021-03-23 DIAGNOSIS — J329 Chronic sinusitis, unspecified: Secondary | ICD-10-CM | POA: Diagnosis not present

## 2021-04-15 ENCOUNTER — Encounter: Payer: Self-pay | Admitting: Family Medicine

## 2021-04-16 MED ORDER — LEVOTHYROXINE SODIUM 200 MCG PO TABS: 200.0000 ug | ORAL_TABLET | Freq: Every day | ORAL | 1 refills | Status: DC

## 2021-04-16 NOTE — Telephone Encounter (Signed)
E-scribed refill 

## 2021-04-21 ENCOUNTER — Encounter: Payer: Self-pay | Admitting: Family Medicine

## 2021-04-21 DIAGNOSIS — I517 Cardiomegaly: Secondary | ICD-10-CM

## 2021-04-26 DIAGNOSIS — I517 Cardiomegaly: Secondary | ICD-10-CM | POA: Insufficient documentation

## 2021-04-27 ENCOUNTER — Encounter: Payer: Self-pay | Admitting: Family Medicine

## 2021-06-05 ENCOUNTER — Other Ambulatory Visit: Payer: Self-pay

## 2021-06-05 ENCOUNTER — Encounter: Payer: Self-pay | Admitting: Family Medicine

## 2021-06-05 ENCOUNTER — Ambulatory Visit: Payer: BC Managed Care – PPO | Admitting: Family Medicine

## 2021-06-05 VITALS — BP 126/78 | HR 89 | Temp 98.3°F | Ht 70.0 in | Wt 298.1 lb

## 2021-06-05 DIAGNOSIS — I10 Essential (primary) hypertension: Secondary | ICD-10-CM | POA: Diagnosis not present

## 2021-06-05 DIAGNOSIS — E1140 Type 2 diabetes mellitus with diabetic neuropathy, unspecified: Secondary | ICD-10-CM

## 2021-06-05 DIAGNOSIS — IMO0002 Reserved for concepts with insufficient information to code with codable children: Secondary | ICD-10-CM

## 2021-06-05 DIAGNOSIS — R400 Somnolence: Secondary | ICD-10-CM

## 2021-06-05 DIAGNOSIS — I517 Cardiomegaly: Secondary | ICD-10-CM | POA: Diagnosis not present

## 2021-06-05 DIAGNOSIS — E1165 Type 2 diabetes mellitus with hyperglycemia: Secondary | ICD-10-CM

## 2021-06-05 DIAGNOSIS — G4733 Obstructive sleep apnea (adult) (pediatric): Secondary | ICD-10-CM | POA: Insufficient documentation

## 2021-06-05 LAB — POCT GLYCOSYLATED HEMOGLOBIN (HGB A1C): Hemoglobin A1C: 7 % — AB (ref 4.0–5.6)

## 2021-06-05 NOTE — Assessment & Plan Note (Signed)
Chronic, well-controlled. 

## 2021-06-05 NOTE — Assessment & Plan Note (Signed)
Snoring, daytime sleepiness, obesity all suggestive of OSA. Also with mild cardiomegaly.  ESS = 8 Will refer to pulm for further evaluation.  He states he would prefer HST vs in-lab sleep study.

## 2021-06-05 NOTE — Assessment & Plan Note (Signed)
Reviewed latest CXR. BP well controlled. Anticipate may be related to obesity and possible untreated sleep apnea - see below.

## 2021-06-05 NOTE — Assessment & Plan Note (Signed)
Chronic, congratulated on improvement noted to date with actos addition. Continue current regimen. Pt motivated to continue weight loss efforts.

## 2021-06-05 NOTE — Patient Instructions (Addendum)
Update EKG today  Congratulations on great sugar control! Keep it up - continue current medicines.  I'd like to refer you to sleep doctor to evaluate for possible sleep apnea.  Return in 3 months for physical.

## 2021-06-05 NOTE — Progress Notes (Signed)
Patient ID: Jorge Boyle, male    DOB: Sep 06, 1976, 45 y.o.   MRN: [id number]  This visit was conducted in person.  BP 126/78   Pulse 89   Temp 98.3 F (36.8 C) (Temporal)   Ht '5\' 10"'  (1.778 m)   Wt 298 lb 2 oz (135.2 kg)   SpO2 97%   BMI 42.78 kg/m    CC: 590moDM f/u visit  Subjective:   HPI: Jorge ASTORGAis a 45y.o. male presenting on 06/05/2021 for Diabetes (Here for 3 mo f/u.)   [facility].   He rejoined gym.   Falls asleep quickly. Does snore. No witnessed apneas, no PNdyspnea. Stays tired, daytime somnolence. Has never had sleep study. Occasional am headaches   DM - does regularly check sugars: normally 100-110, peak 160. Compliant with antihyperglycemic regimen which includes: metformin 100290mbid, ozempic 90m43meekly. Last visti we added actos 78m43mily. Denies low sugars or hypoglycemic symptoms. Denies paresthesias, blurry vision. Last diabetic eye exam: due, scheduled 08/2021. Glucometer brand: contour. Last foot exam: 10/2020 - rpt today. DSME: declined.  Lab Results  Component Value Date   HGBA1C 7.0 (A) 06/05/2021   Diabetic Foot Exam - Simple   Simple Foot Form Diabetic Foot exam was performed with the following findings: Yes 06/05/2021  4:25 PM  Visual Inspection No deformities, no ulcerations, no other skin breakdown bilaterally: Yes Sensation Testing See comments: Yes Pulse Check Posterior Tibialis and Dorsalis pulse intact bilaterally: Yes Comments Mildly diminished sensation to monofilament testing L sole    Lab Results  Component Value Date   MICROALBUR 0.9 03/04/2019         Relevant past medical, surgical, family and social history reviewed and updated as indicated. Interim medical history since our last visit reviewed. Allergies and medications reviewed and updated. Outpatient Medications Prior to Visit  Medication Sig Dispense Refill   atorvastatin (LIPITOR) 20 MG  tablet TAKE 1 TABLET BY MOUTH  DAILY (Patient taking differently: Take 20 mg by mouth daily.) 90 tablet 3   Blood Glucose Monitoring Suppl (CONTOUR NEXT MONITOR) w/Device KIT Use as instructed to check blood sugar once daily. 1 kit 0   COLCRYS 0.6 MG tablet TAKE 1 TABLET (0.6 MG TOTAL) BY MOUTH DAILY. MAY TAKE 2 TABLETS ON THE FIRST DAY OF USE (Patient taking differently: Take 0.6 mg by mouth daily as needed (gout).) 90 tablet 0   cyclobenzaprine (FLEXERIL) 10 MG tablet Take 0.5-1 tablets (5-10 mg total) by mouth 2 (two) times daily as needed for muscle spasms (sedation precautions). 30 tablet 1   glucose blood (CONTOUR NEXT TEST) test strip Use as instructed to check blood sugar once daily. 100 each 3   ibuprofen (ADVIL) 600 MG tablet Take 1 tablet (600 mg total) by mouth 2 (two) times daily as needed for moderate pain. 30 tablet 0   levothyroxine (SYNTHROID) 200 MCG tablet Take 1 tablet (200 mcg total) by mouth daily at 6 (six) AM. 90 tablet 1   losartan (COZAAR) 100 MG tablet TAKE 1 TABLET BY MOUTH  DAILY 90 tablet 3   metFORMIN (GLUCOPHAGE) 1000 MG tablet TAKE 1 TABLET BY MOUTH  TWICE DAILY WITH A MEAL 180 tablet 3   Microlet Lancets MISC Use as directed to check blood sugars once daily. 100 each 3   pioglitazone (ACTOS) 15 MG tablet Take 1 tablet (15 mg total) by mouth daily. 30 tablet 6  Semaglutide, 1 MG/DOSE, (OZEMPIC, 1 MG/DOSE,) 4 MG/3ML SOPN Inject 1 mg into the skin once a week. 9 mL 3   fluticasone (FLONASE) 50 MCG/ACT nasal spray Place 2 sprays into both nostrils daily.     benzonatate (TESSALON) 100 MG capsule Take 1 capsule (100 mg total) by mouth 3 (three) times daily as needed for cough (swallow, don't chew). 30 capsule 0   clindamycin (CLEOCIN) 150 MG capsule Take by mouth.     No facility-administered medications prior to visit.     Per HPI unless specifically indicated in ROS section below Review of Systems  Objective:  BP 126/78   Pulse 89   Temp 98.3 F (36.8 C)  (Temporal)   Ht '5\' 10"'  (1.778 m)   Wt 298 lb 2 oz (135.2 kg)   SpO2 97%   BMI 42.78 kg/m   Wt Readings from Last 3 Encounters:  06/05/21 298 lb 2 oz (135.2 kg)  03/05/21 297 lb 7 oz (134.9 kg)  02/06/21 299 lb 2 oz (135.7 kg)      Physical Exam Vitals and nursing note reviewed.  Constitutional:      Appearance: Normal appearance. He is not ill-appearing.  Eyes:     Extraocular Movements: Extraocular movements intact.     Conjunctiva/sclera: Conjunctivae normal.     Pupils: Pupils are equal, round, and reactive to light.  Cardiovascular:     Rate and Rhythm: Normal rate and regular rhythm.     Pulses: Normal pulses.     Heart sounds: Normal heart sounds. No murmur heard. Pulmonary:     Effort: Pulmonary effort is normal. No respiratory distress.     Breath sounds: Normal breath sounds. No wheezing, rhonchi or rales.  Musculoskeletal:     Right lower leg: No edema.     Left lower leg: No edema.     Comments: See HPI for foot exam if done  Skin:    General: Skin is warm and dry.     Findings: No rash.  Neurological:     Mental Status: He is alert.  Psychiatric:        Mood and Affect: Mood normal.        Behavior: Behavior normal.      Results for orders placed or performed in visit on 06/05/21  POCT glycosylated hemoglobin (Hb A1C)  Result Value Ref Range   Hemoglobin A1C 7.0 (A) 4.0 - 5.6 %   HbA1c POC (<> result, manual entry)     HbA1c, POC (prediabetic range)     HbA1c, POC (controlled diabetic range)     Lab Results  Component Value Date   TSH 4.34 10/25/2020    EKG - NSR rate 80s, normal axis, intervals, no hypertrophy or acute ST/T changes  Assessment & Plan:  This visit occurred during the SARS-CoV-2 public health emergency.  Safety protocols were in place, including screening questions prior to the visit, additional usage of staff PPE, and extensive cleaning of exam room while observing appropriate contact time as indicated for disinfecting solutions.    Problem List Items Addressed This Visit     Obesity, morbid, BMI 40.0-49.9 ([place])    Motivated to continue working towards weight loss.  He just rejoined the gym.       Relevant Orders   Ambulatory referral to Pulmonology   Type 2 diabetes mellitus, uncontrolled, with neuropathy ([place]) - Primary    Chronic, congratulated on improvement noted to date with actos addition. Continue current regimen. Pt motivated to  continue weight loss efforts.       Relevant Orders   POCT glycosylated hemoglobin (Hb A1C) (Completed)   EKG 12-Lead (Completed)   Essential hypertension    Chronic, well controlled.      Cardiomegaly    Reviewed latest CXR. BP well controlled. Anticipate may be related to obesity and possible untreated sleep apnea - see below.       Relevant Orders   Ambulatory referral to Pulmonology   Daytime somnolence    Snoring, daytime sleepiness, obesity all suggestive of OSA. Also with mild cardiomegaly.  ESS = 8 Will refer to pulm for further evaluation.  He states he would prefer HST vs in-lab sleep study.       Relevant Orders   Ambulatory referral to Pulmonology     No orders of the defined types were placed in this encounter.  Orders Placed This Encounter  Procedures   Ambulatory referral to Pulmonology    Referral Priority:   Routine    Referral Type:   Consultation    Referral Reason:   Specialty Services Required    Requested Specialty:   Pulmonary Disease    Number of Visits Requested:   1   POCT glycosylated hemoglobin (Hb A1C)   EKG 12-Lead     Patient Instructions  Update EKG today  Congratulations on great sugar control! Keep it up - continue current medicines.  I'd like to refer you to sleep doctor to evaluate for possible sleep apnea.  Return in 3 months for physical.   Follow up plan: Return in about 3 months (around 09/05/2021), or if symptoms worsen or fail to improve, for annual exam, prior fasting for blood work.  Ria Bush, MD

## 2021-06-05 NOTE — Assessment & Plan Note (Signed)
Motivated to continue working towards weight loss.  He just rejoined the gym.

## 2021-07-20 ENCOUNTER — Encounter: Payer: Self-pay | Admitting: General Surgery

## 2021-08-09 ENCOUNTER — Other Ambulatory Visit: Payer: Self-pay | Admitting: Family Medicine

## 2021-08-17 ENCOUNTER — Other Ambulatory Visit: Payer: Self-pay | Admitting: Family Medicine

## 2021-08-20 ENCOUNTER — Other Ambulatory Visit: Payer: Self-pay | Admitting: Family Medicine

## 2021-08-24 ENCOUNTER — Other Ambulatory Visit: Payer: Self-pay | Admitting: Family Medicine

## 2021-08-29 ENCOUNTER — Other Ambulatory Visit: Payer: Self-pay | Admitting: Family Medicine

## 2021-09-16 ENCOUNTER — Other Ambulatory Visit: Payer: Self-pay | Admitting: Family Medicine

## 2021-09-16 DIAGNOSIS — E785 Hyperlipidemia, unspecified: Secondary | ICD-10-CM

## 2021-09-16 DIAGNOSIS — M1A00X0 Idiopathic chronic gout, unspecified site, without tophus (tophi): Secondary | ICD-10-CM

## 2021-09-16 DIAGNOSIS — Z1159 Encounter for screening for other viral diseases: Secondary | ICD-10-CM

## 2021-09-16 DIAGNOSIS — E1140 Type 2 diabetes mellitus with diabetic neuropathy, unspecified: Secondary | ICD-10-CM

## 2021-09-16 DIAGNOSIS — E039 Hypothyroidism, unspecified: Secondary | ICD-10-CM

## 2021-09-17 ENCOUNTER — Other Ambulatory Visit: Payer: BC Managed Care – PPO

## 2021-09-21 ENCOUNTER — Encounter: Payer: Self-pay | Admitting: Family Medicine

## 2021-09-21 ENCOUNTER — Ambulatory Visit (INDEPENDENT_AMBULATORY_CARE_PROVIDER_SITE_OTHER): Payer: BC Managed Care – PPO | Admitting: Family Medicine

## 2021-09-21 ENCOUNTER — Other Ambulatory Visit: Payer: Self-pay

## 2021-09-21 VITALS — BP 126/64 | HR 89 | Temp 98.2°F | Ht 70.0 in | Wt 300.4 lb

## 2021-09-21 DIAGNOSIS — E1140 Type 2 diabetes mellitus with diabetic neuropathy, unspecified: Secondary | ICD-10-CM

## 2021-09-21 DIAGNOSIS — E785 Hyperlipidemia, unspecified: Secondary | ICD-10-CM

## 2021-09-21 DIAGNOSIS — Z0001 Encounter for general adult medical examination with abnormal findings: Secondary | ICD-10-CM | POA: Diagnosis not present

## 2021-09-21 DIAGNOSIS — I10 Essential (primary) hypertension: Secondary | ICD-10-CM

## 2021-09-21 DIAGNOSIS — K760 Fatty (change of) liver, not elsewhere classified: Secondary | ICD-10-CM

## 2021-09-21 DIAGNOSIS — R400 Somnolence: Secondary | ICD-10-CM

## 2021-09-21 DIAGNOSIS — Z1159 Encounter for screening for other viral diseases: Secondary | ICD-10-CM | POA: Diagnosis not present

## 2021-09-21 DIAGNOSIS — M1A00X0 Idiopathic chronic gout, unspecified site, without tophus (tophi): Secondary | ICD-10-CM

## 2021-09-21 DIAGNOSIS — Z1211 Encounter for screening for malignant neoplasm of colon: Secondary | ICD-10-CM

## 2021-09-21 DIAGNOSIS — E890 Postprocedural hypothyroidism: Secondary | ICD-10-CM

## 2021-09-21 DIAGNOSIS — Z23 Encounter for immunization: Secondary | ICD-10-CM | POA: Diagnosis not present

## 2021-09-21 DIAGNOSIS — E1169 Type 2 diabetes mellitus with other specified complication: Secondary | ICD-10-CM

## 2021-09-21 DIAGNOSIS — E039 Hypothyroidism, unspecified: Secondary | ICD-10-CM

## 2021-09-21 DIAGNOSIS — M542 Cervicalgia: Secondary | ICD-10-CM

## 2021-09-21 MED ORDER — NAPROXEN 500 MG PO TABS: ORAL_TABLET | ORAL | 0 refills | Status: DC

## 2021-09-21 MED ORDER — CYCLOBENZAPRINE HCL 10 MG PO TABS: 5.0000 mg | ORAL_TABLET | Freq: Two times a day (BID) | ORAL | 1 refills | Status: AC | PRN

## 2021-09-21 MED ORDER — FLUTICASONE PROPIONATE 50 MCG/ACT NA SUSP: 2.0000 | Freq: Every day | NASAL | 3 refills | Status: DC

## 2021-09-21 NOTE — Assessment & Plan Note (Signed)
Anticipate cervical strain/spasms - discussed supportive measures and will Rx naprosyn and flexeril. Update if not improving with treatment.

## 2021-09-21 NOTE — Assessment & Plan Note (Signed)
Preventative protocols reviewed and updated unless pt declined. Discussed healthy diet and lifestyle.  

## 2021-09-21 NOTE — Assessment & Plan Note (Addendum)
#   provided to call and schedule appointment with pulmonology

## 2021-09-21 NOTE — Assessment & Plan Note (Signed)
Continue to encourage healthy diet and lifestyle choices to affect sustainable weight loss.  °

## 2021-09-21 NOTE — Assessment & Plan Note (Signed)
Chronic, well controlled on current regimen. Continue losartan

## 2021-09-21 NOTE — Assessment & Plan Note (Signed)
Update uric acid levels only on PRN colcrys.

## 2021-09-21 NOTE — Patient Instructions (Addendum)
Labs today  Flu shot today  Call [facility] in [place] at [phone]  to schedule sleep apnea evaluation.  For shoulder and neck - very tight muscles. May try muscle relaxant sent to pharmacy. Also use topical treatments like icy hot, heating pad (covered in a towel). Take naprosyn course for pain as needed.  We will refer you for colonoscopy in [place] - check with insurance to make sure this is covered at age 45. Schedule eye exam.   Health Maintenance, Male Adopting a healthy lifestyle and getting preventive care are important in promoting health and wellness. Ask your health care provider about: The right schedule for you to have regular tests and exams. Things you can do on your own to prevent diseases and keep yourself healthy. What should I know about diet, weight, and exercise? Eat a healthy diet  Eat a diet that includes plenty of vegetables, fruits, low-fat dairy products, and lean protein. Do not eat a lot of foods that are high in solid fats, added sugars, or sodium. Maintain a healthy weight Body mass index (BMI) is a measurement that can be used to identify possible weight problems. It estimates body fat based on height and weight. Your health care provider can help determine your BMI and help you achieve or maintain a healthy weight. Get regular exercise Get regular exercise. This is one of the most important things you can do for your health. Most adults should: Exercise for at least 150 minutes each week. The exercise should increase your heart rate and make you sweat (moderate-intensity exercise). Do strengthening exercises at least twice a week. This is in addition to the moderate-intensity exercise. Spend less time sitting. Even light physical activity can be beneficial. Watch cholesterol and blood lipids Have your blood tested for lipids and cholesterol at 46 years of age, then have this test every 5 years. You may need to have your cholesterol levels  checked more often if: Your lipid or cholesterol levels are high. You are older than 46 years of age. You are at high risk for heart disease. What should I know about cancer screening? Many types of cancers can be detected early and may often be prevented. Depending on your health history and family history, you may need to have cancer screening at various ages. This may include screening for: Colorectal cancer. Prostate cancer. Skin cancer. Lung cancer. What should I know about heart disease, diabetes, and high blood pressure? Blood pressure and heart disease High blood pressure causes heart disease and increases the risk of stroke. This is more likely to develop in people who have high blood pressure readings or are overweight. Talk with your health care provider about your target blood pressure readings. Have your blood pressure checked: Every 3-5 years if you are 20-23 years of age. Every year if you are 69 years old or older. If you are between the ages of 67 and 36 and are a current or former smoker, ask your health care provider if you should have a one-time screening for abdominal aortic aneurysm (AAA). Diabetes Have regular diabetes screenings. This checks your fasting blood sugar level. Have the screening done: Once every three years after age 40 if you are at a normal weight and have a low risk for diabetes. More often and at a younger age if you are overweight or have a high risk for diabetes. What should I know about preventing infection? Hepatitis B If you have a higher risk for hepatitis B, you  should be screened for this virus. Talk with your health care provider to find out if you are at risk for hepatitis B infection. Hepatitis C Blood testing is recommended for: Everyone born from [birth year] through 1966. Anyone with known risk factors for hepatitis C. Sexually transmitted infections (STIs) You should be screened each year for STIs, including gonorrhea and chlamydia,  if: You are sexually active and are younger than 46 years of age. You are older than 46 years of age and your health care provider tells you that you are at risk for this type of infection. Your sexual activity has changed since you were last screened, and you are at increased risk for chlamydia or gonorrhea. Ask your health care provider if you are at risk. Ask your health care provider about whether you are at high risk for HIV. Your health care provider may recommend a prescription medicine to help prevent HIV infection. If you choose to take medicine to prevent HIV, you should first get tested for HIV. You should then be tested every 3 months for as long as you are taking the medicine. Follow these instructions at home: Alcohol use Do not drink alcohol if your health care provider tells you not to drink. If you drink alcohol: Limit how much you have to 0-2 drinks a day. Know how much alcohol is in your drink. In the [place] one drink equals one 12 oz bottle of beer (355 mL), one 5 oz glass of wine (148 mL), or one 1 oz glass of hard liquor (44 mL). Lifestyle Do not use any products that contain nicotine or tobacco. These products include cigarettes, chewing tobacco, and vaping devices, such as e-cigarettes. If you need help quitting, ask your health care provider. Do not use street drugs. Do not share needles. Ask your health care provider for help if you need support or information about quitting drugs. General instructions Schedule regular health, dental, and eye exams. Stay current with your vaccines. Tell your health care provider if: You often feel depressed. You have ever been abused or do not feel safe at home. Summary Adopting a healthy lifestyle and getting preventive care are important in promoting health and wellness. Follow your health care provider's instructions about healthy diet, exercising, and getting tested or screened for diseases. Follow your health care provider's  instructions on monitoring your cholesterol and blood pressure. This information is not intended to replace advice given to you by your health care provider. Make sure you discuss any questions you have with your health care provider. Document Revised: 02/26/2021 Document Reviewed: 02/26/2021 Elsevier Patient Education  [employer].

## 2021-09-21 NOTE — Assessment & Plan Note (Addendum)
Update A1c on ozempic and actos. RTC 6 mo DM f/u visit.

## 2021-09-21 NOTE — Assessment & Plan Note (Signed)
Chronic, stable on atorvastatin - update FLP. The ASCVD Risk score (Arnett DK, et al., 2019) failed to calculate for the following reasons:   The valid total cholesterol range is 130 to 320 mg/dL  

## 2021-09-21 NOTE — Addendum Note (Signed)
Addended by: Ria Bush on: 09/21/2021 03:18 PM   Modules accepted: Orders

## 2021-09-21 NOTE — Progress Notes (Signed)
Patient ID: [place], male    DOB: 1976-09-07, 45 y.o.   MRN: [id number]  This visit was conducted in person.  BP 126/64   Pulse 89   Temp 98.2 F (36.8 C) (Temporal)   Ht 5' 10" (1.778 m)   Wt (!) 300 lb 7 oz (136.3 kg)   SpO2 96%   BMI 43.11 kg/m    CC: CPE Subjective:   HPI: [place] is a 45 y.o. male presenting on 09/21/2021 for Annual Exam (C/o right side neck pain and right side back pain for about 1 mo.  Tried OTC topical pain meds. Has started working out at gym. 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   Eval for OSA - didn't return calls to schedule eval with pulm.   R neck and shoulder pain over the past month. Denies inciting trauma/injury or falls. Has previously used NSAID and muscle relaxant, icy hot.   Preventative: Colon cancer screening - discussed - agrees to colonoscopy  Flu - yearly [place] 01/2020, 02/2020, booster 09/2020, bivalent boost 07/2021 Tdap 12/2011  Pneumovax 2016  Seat belt use discussed.  Sunscreen use discussed. Denies changing moles on skin.  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 Interim medical history since our last visit reviewed. Allergies and medications reviewed and updated. Outpatient Medications Prior to Visit  Medication Sig Dispense Refill   atorvastatin (LIPITOR) 20 MG tablet TAKE 1 TABLET BY MOUTH  DAILY 90 tablet 0   Blood Glucose Monitoring Suppl (CONTOUR  NEXT MONITOR) w/Device KIT Use as instructed to check blood sugar once daily. 1 kit 0   COLCRYS 0.6 MG tablet TAKE 1 TABLET (0.6 MG TOTAL) BY MOUTH DAILY. MAY TAKE 2 TABLETS ON THE FIRST DAY OF USE (Patient taking differently: Take 0.6 mg by mouth daily as needed (gout).) 90 tablet 0   glucose blood (CONTOUR NEXT TEST) test strip Use as instructed to check blood sugar once daily. 100 each 3   levothyroxine (SYNTHROID) 200 MCG tablet TAKE 1 TABLET BY MOUTH  DAILY AT 6AM 90 tablet 0   losartan (COZAAR) 100 MG tablet TAKE 1 TABLET BY MOUTH  DAILY 90 tablet 0   metFORMIN (GLUCOPHAGE) 1000 MG tablet TAKE 1 TABLET BY MOUTH  TWICE DAILY WITH A MEAL 180 tablet 0   Microlet Lancets MISC Use as directed to check blood sugars once daily. 100 each 3   pioglitazone (ACTOS) 15 MG tablet TAKE 1 TABLET (15 MG TOTAL) BY MOUTH DAILY. 90 tablet 0   Semaglutide, 1 MG/DOSE, (OZEMPIC, 1 MG/DOSE,) 4 MG/3ML SOPN Inject 1 mg into the skin once a week. 9 mL 3   cyclobenzaprine (FLEXERIL) 10 MG tablet Take 0.5-1 tablets (5-10 mg total) by mouth 2 (two) times daily as needed for muscle spasms (sedation precautions). 30 tablet 1   fluticasone (FLONASE) 50 MCG/ACT nasal spray Place 2 sprays into both nostrils daily.     ibuprofen (  ADVIL) 600 MG tablet Take 1 tablet (600 mg total) by mouth 2 (two) times daily as needed for moderate pain. 30 tablet 0   No facility-administered medications prior to visit.     Per HPI unless specifically indicated in ROS section below Review of Systems  Constitutional:  Negative for activity change, appetite change, chills, fatigue, fever and unexpected weight change.  HENT:  Negative for hearing loss.   Eyes:  Negative for visual disturbance.  Respiratory:  Negative for cough, chest tightness, shortness of breath and wheezing.   Cardiovascular:  Negative for chest pain, palpitations and leg swelling.  Gastrointestinal:  Negative for abdominal distention, abdominal pain, blood in stool,  constipation, diarrhea, nausea and vomiting.  Genitourinary:  Negative for difficulty urinating and hematuria.  Musculoskeletal:  Negative for arthralgias, myalgias and neck pain.  Skin:  Negative for rash.  Neurological:  Positive for headaches. Negative for dizziness, seizures and syncope.  Hematological:  Negative for adenopathy. Does not bruise/bleed easily.  Psychiatric/Behavioral:  Negative for dysphoric mood. The patient is not nervous/anxious.    Objective:  BP 126/64   Pulse 89   Temp 98.2 F (36.8 C) (Temporal)   Ht 5' 10" (1.778 m)   Wt (!) 300 lb 7 oz (136.3 kg)   SpO2 96%   BMI 43.11 kg/m   Wt Readings from Last 3 Encounters:  09/21/21 (!) 300 lb 7 oz (136.3 kg)  06/05/21 298 lb 2 oz (135.2 kg)  03/05/21 297 lb 7 oz (134.9 kg)      Physical Exam Vitals and nursing note reviewed.  Constitutional:      General: He is not in acute distress.    Appearance: Normal appearance. He is well-developed. He is not ill-appearing.  HENT:     Head: Normocephalic and atraumatic.     Right Ear: Hearing, tympanic membrane, ear canal and external ear normal.     Left Ear: Hearing, tympanic membrane, ear canal and external ear normal.  Eyes:     General: No scleral icterus.    Extraocular Movements: Extraocular movements intact.     Conjunctiva/sclera: Conjunctivae normal.     Pupils: Pupils are equal, round, and reactive to light.  Neck:     Thyroid: No thyroid mass or thyromegaly.     Comments:  ROM preserved at cervical neck, limited ROM noted to R lateral rotation and flexion due to discomfort/tightness No pain midline cervical spine  Discomfort and tightness to R trapezius mm  Cardiovascular:     Rate and Rhythm: Normal rate and regular rhythm.     Pulses: Normal pulses.          Radial pulses are 2+ on the right side and 2+ on the left side.     Heart sounds: Normal heart sounds. No murmur heard. Pulmonary:     Effort: Pulmonary effort is normal. No respiratory  distress.     Breath sounds: Normal breath sounds. No wheezing, rhonchi or rales.  Abdominal:     General: Bowel sounds are normal. There is no distension.     Palpations: Abdomen is soft. There is no mass.     Tenderness: There is no abdominal tenderness. There is no guarding or rebound.     Hernia: No hernia is present.  Musculoskeletal:        General: Tenderness present. Normal range of motion.     Cervical back: Normal range of motion and neck supple.     Right lower leg: No edema.       Left lower leg: No edema.  Lymphadenopathy:     Cervical: No cervical adenopathy.  Skin:    General: Skin is warm and dry.     Findings: No rash.  Neurological:     General: No focal deficit present.     Mental Status: He is alert and oriented to person, place, and time.  Psychiatric:        Mood and Affect: Mood normal.        Behavior: Behavior normal.        Thought Content: Thought content normal.        Judgment: Judgment normal.      Results for orders placed or performed in visit on 06/05/21  POCT glycosylated hemoglobin (Hb A1C)  Result Value Ref Range   Hemoglobin A1C 7.0 (A) 4.0 - 5.6 %   HbA1c POC (<> result, manual entry)     HbA1c, POC (prediabetic range)     HbA1c, POC (controlled diabetic range)      Assessment & Plan:  This visit occurred during the SARS-CoV-2 public health emergency.  Safety protocols were in place, including screening questions prior to the visit, additional usage of staff PPE, and extensive cleaning of exam room while observing appropriate contact time as indicated for disinfecting solutions.   Problem List Items Addressed This Visit     Encounter for general adult medical examination with abnormal findings - Primary (Chronic)    Preventative protocols reviewed and updated unless pt declined. Discussed healthy diet and lifestyle.       Obesity, morbid, BMI 40.0-49.9 ([place])    Continue to encourage healthy diet and lifestyle choices to affect  sustainable weight loss.       Type 2 diabetes mellitus with diabetic neuropathy, unspecified (HCC)    Update A1c on ozempic and actos. RTC 6 mo DM f/u visit.       Gout    Update uric acid levels only on PRN colcrys.       Dyslipidemia associated with type 2 diabetes mellitus (HCC)    Chronic, stable on atorvastatin - update FLP. The ASCVD Risk score (Arnett DK, et al., 2019) failed to calculate for the following reasons:   The valid total cholesterol range is 130 to 320 mg/dL       Essential hypertension    Chronic, well controlled on current regimen. Continue losartan       Neck pain on right side    Anticipate cervical strain/spasms - discussed supportive measures and will Rx naprosyn and flexeril. Update if not improving with treatment.       S/P total thyroidectomy   Acquired hypothyroidism    Update thyroid function on levothyroxine.      Hepatic steatosis   Daytime somnolence    # provided to call and schedule appointment with pulmonology      Other Visit Diagnoses     Special screening for malignant neoplasms, colon       Relevant Orders   Ambulatory referral to Gastroenterology   Need for hepatitis C screening test            Meds ordered this encounter  Medications   fluticasone (FLONASE) 50 MCG/ACT nasal spray    Sig: Place 2 sprays into both nostrils daily.    Dispense:  48 g    Refill:  3   naproxen (NAPROSYN) 500 MG tablet    Sig: Take one po bid x 1 week then prn pain, take with food    Dispense:  50 tablet    Refill:  0   cyclobenzaprine (FLEXERIL) 10 MG tablet    Sig: Take 0.5-1 tablets (5-10 mg total) by mouth 2 (two) times daily as needed for muscle spasms (sedation precautions).    Dispense:  30 tablet    Refill:  1    Orders Placed This Encounter  Procedures   Ambulatory referral to Gastroenterology    Referral Priority:   Routine    Referral Type:   Consultation    Referral Reason:   Specialty Services Required    Number of  Visits Requested:   1     Patient instructions: Labs today  Flu shot today  Call [facility] in [place] at [phone]  to schedule sleep apnea evaluation.  For shoulder and neck - very tight muscles. May try muscle relaxant sent to pharmacy. Also use topical treatments like icy hot, heating pad (covered in a towel). Take naprosyn course for pain as needed.  We will refer you for colonoscopy in [place] - check with insurance to make sure this is covered at age 44. Schedule eye exam.   Follow up plan: Return in about 6 months (around 03/22/2022) for follow up visit.   , MD   

## 2021-09-21 NOTE — Assessment & Plan Note (Signed)
Update thyroid function on levothyroxine.

## 2021-09-21 NOTE — Addendum Note (Signed)
Addended by: Brenton Grills on: 28/06/7914 04:13 PM   Modules accepted: Orders

## 2021-09-22 ENCOUNTER — Other Ambulatory Visit: Payer: Self-pay | Admitting: Family Medicine

## 2021-09-22 MED ORDER — PIOGLITAZONE HCL 15 MG PO TABS: 15.0000 mg | ORAL_TABLET | Freq: Every day | ORAL | 3 refills | Status: DC

## 2021-09-22 MED ORDER — METFORMIN HCL 1000 MG PO TABS: 1000.0000 mg | ORAL_TABLET | Freq: Two times a day (BID) | ORAL | 3 refills | Status: DC

## 2021-09-22 MED ORDER — LOSARTAN POTASSIUM 100 MG PO TABS: 100.0000 mg | ORAL_TABLET | Freq: Every day | ORAL | 3 refills | Status: DC

## 2021-09-22 MED ORDER — COLCHICINE 0.6 MG PO TABS: 0.6000 mg | ORAL_TABLET | Freq: Every day | ORAL | 3 refills | Status: DC | PRN

## 2021-09-22 MED ORDER — ATORVASTATIN CALCIUM 20 MG PO TABS: 20.0000 mg | ORAL_TABLET | Freq: Every day | ORAL | 3 refills | Status: DC

## 2021-09-22 MED ORDER — OZEMPIC (1 MG/DOSE) 4 MG/3ML ~~LOC~~ SOPN: 1.0000 mg | PEN_INJECTOR | SUBCUTANEOUS | 3 refills | Status: DC

## 2021-09-22 MED ORDER — LEVOTHYROXINE SODIUM 200 MCG PO TABS
200.0000 ug | ORAL_TABLET | Freq: Every day | ORAL | 3 refills | Status: DC
Start: 2021-09-22 — End: 2022-05-13

## 2021-09-24 LAB — CBC WITH DIFFERENTIAL/PLATELET
Absolute Monocytes: 941 cells/uL (ref 200–950)
Basophils Absolute: 49 cells/uL (ref 0–200)
Basophils Relative: 0.5 %
Eosinophils Absolute: 320 cells/uL (ref 15–500)
Eosinophils Relative: 3.3 %
HCT: 42.8 % (ref 38.5–50.0)
Hemoglobin: 14.5 g/dL (ref 13.2–17.1)
Lymphs Abs: 2415 cells/uL (ref 850–3900)
MCH: 29.8 pg (ref 27.0–33.0)
MCHC: 33.9 g/dL (ref 32.0–36.0)
MCV: 88.1 fL (ref 80.0–100.0)
MPV: 10.5 fL (ref 7.5–12.5)
Monocytes Relative: 9.7 %
Neutro Abs: 5975 cells/uL (ref 1500–7800)
Neutrophils Relative %: 61.6 %
Platelets: 288 10*3/uL (ref 140–400)
RBC: 4.86 10*6/uL (ref 4.20–5.80)
RDW: 13.1 % (ref 11.0–15.0)
Total Lymphocyte: 24.9 %
WBC: 9.7 10*3/uL (ref 3.8–10.8)

## 2021-09-24 LAB — HEMOGLOBIN A1C
Hgb A1c MFr Bld: 7.5 % of total Hgb — ABNORMAL HIGH (ref <5.7)
Mean Plasma Glucose: 169 mg/dL
eAG (mmol/L): 9.3 mmol/L

## 2021-09-24 LAB — LIPID PANEL
Cholesterol: 109 mg/dL (ref <200)
HDL: 22 mg/dL — ABNORMAL LOW (ref >=40)
LDL Cholesterol (Calc): 72 mg/dL (calc)
Non-HDL Cholesterol (Calc): 87 mg/dL (calc) (ref <130)
Total CHOL/HDL Ratio: 5 (calc) — ABNORMAL HIGH (ref <5.0)
Triglycerides: 70 mg/dL (ref <150)

## 2021-09-24 LAB — COMPREHENSIVE METABOLIC PANEL
AG Ratio: 1.3 (calc) (ref 1.0–2.5)
ALT: 34 U/L (ref 9–46)
AST: 19 U/L (ref 10–40)
Albumin: 4 g/dL (ref 3.6–5.1)
Alkaline phosphatase (APISO): 64 U/L (ref 36–130)
BUN: 11 mg/dL (ref 7–25)
CO2: 26 mmol/L (ref 20–32)
Calcium: 8.5 mg/dL — ABNORMAL LOW (ref 8.6–10.3)
Chloride: 102 mmol/L (ref 98–110)
Creat: 0.87 mg/dL (ref 0.60–1.29)
Globulin: 3 g/dL (calc) (ref 1.9–3.7)
Glucose, Bld: 139 mg/dL — ABNORMAL HIGH (ref 65–99)
Potassium: 3.9 mmol/L (ref 3.5–5.3)
Sodium: 139 mmol/L (ref 135–146)
Total Bilirubin: 0.4 mg/dL (ref 0.2–1.2)
Total Protein: 7 g/dL (ref 6.1–8.1)

## 2021-09-24 LAB — HEPATITIS C ANTIBODY
Hepatitis C Ab: NONREACTIVE
SIGNAL TO CUT-OFF: 0.04 (ref <1.00)

## 2021-09-24 LAB — URIC ACID: Uric Acid, Serum: 8.1 mg/dL — ABNORMAL HIGH (ref 4.0–8.0)

## 2021-09-24 LAB — TSH: TSH: 0.23 mIU/L — ABNORMAL LOW (ref 0.40–4.50)

## 2021-10-01 ENCOUNTER — Other Ambulatory Visit: Payer: Self-pay | Admitting: Family Medicine

## 2021-10-16 ENCOUNTER — Other Ambulatory Visit: Payer: Self-pay | Admitting: Family Medicine

## 2021-10-16 NOTE — Telephone Encounter (Signed)
Naproxen Last filled:  09/21/21, #50 Last OV:  09/21/21, CPE Next OV:  none

## 2021-10-21 ENCOUNTER — Other Ambulatory Visit: Payer: Self-pay | Admitting: Family Medicine

## 2021-11-09 LAB — HM DIABETES EYE EXAM

## 2021-11-14 ENCOUNTER — Other Ambulatory Visit: Payer: Self-pay | Admitting: Family Medicine

## 2021-12-13 ENCOUNTER — Encounter: Payer: Self-pay | Admitting: Family Medicine

## 2021-12-18 ENCOUNTER — Other Ambulatory Visit: Payer: Self-pay | Admitting: Family Medicine

## 2021-12-18 NOTE — Telephone Encounter (Signed)
Last refill Naprosyn Last refill 10/17/21 #50 Last office visit 09/21/21

## 2021-12-24 DIAGNOSIS — M545 Low back pain, unspecified: Secondary | ICD-10-CM | POA: Diagnosis not present

## 2021-12-24 DIAGNOSIS — M5416 Radiculopathy, lumbar region: Secondary | ICD-10-CM | POA: Diagnosis not present

## 2022-01-25 DIAGNOSIS — M5416 Radiculopathy, lumbar region: Secondary | ICD-10-CM | POA: Diagnosis not present

## 2022-01-28 ENCOUNTER — Other Ambulatory Visit: Payer: Self-pay | Admitting: Family Medicine

## 2022-01-30 NOTE — Telephone Encounter (Signed)
ERx 

## 2022-01-30 NOTE — Telephone Encounter (Signed)
Refill request Naproxen ?Last office visit 09/21/21 ?Last refill 12/19/21 #50 ?

## 2022-02-11 DIAGNOSIS — M5416 Radiculopathy, lumbar region: Secondary | ICD-10-CM | POA: Diagnosis not present

## 2022-02-12 ENCOUNTER — Encounter: Payer: Self-pay | Admitting: Family Medicine

## 2022-03-01 ENCOUNTER — Telehealth: Payer: Self-pay

## 2022-03-01 ENCOUNTER — Other Ambulatory Visit: Payer: Self-pay

## 2022-03-01 DIAGNOSIS — Z1211 Encounter for screening for malignant neoplasm of colon: Secondary | ICD-10-CM

## 2022-03-01 MED ORDER — NA SULFATE-K SULFATE-MG SULF 17.5-3.13-1.6 GM/177ML PO SOLN: 1.0000 | Freq: Once | ORAL | 0 refills | Status: AC

## 2022-03-01 NOTE — Telephone Encounter (Signed)
Gastroenterology Pre-Procedure Review ? ?Request Date: 04/02/22 ?Requesting Physician: Dr. Vicente Males ? ?PATIENT REVIEW QUESTIONS: The patient responded to the following health history questions as indicated:   ? ?1. Are you having any GI issues? no ?2. Do you have a personal history of Polyps? no ?3. Do you have a family history of Colon Cancer or Polyps? yes (brother colon polyps) ?4. Diabetes Mellitus? yes (type 2) ?5. Joint replacements in the past 12 months?no ?6. Major health problems in the past 3 months?no ?7. Any artificial heart valves, MVP, or defibrillator? Enlarged heart however not followed by a cardiologist ?   ?MEDICATIONS & ALLERGIES:    ?Patient reports the following regarding taking any anticoagulation/antiplatelet therapy:   ?Plavix, Coumadin, Eliquis, Xarelto, Lovenox, Pradaxa, Brilinta, or Effient? no ?Aspirin? no ? ?Patient confirms/reports the following medications:  ?Current Outpatient Medications  ?Medication Sig Dispense Refill  ? atorvastatin (LIPITOR) 20 MG tablet Take 1 tablet (20 mg total) by mouth daily. 90 tablet 3  ? Blood Glucose Monitoring Suppl (CONTOUR NEXT MONITOR) w/Device KIT Use as instructed to check blood sugar once daily. 1 kit 0  ? colchicine 0.6 MG tablet TAKE 1 TABLET (0.6 MG TOTAL) BY MOUTH DAILY AS NEEDED (GOUT FLARE). MAY TAKE 2 TABLETS ON FIRST DAY OF USE 30 tablet 3  ? cyclobenzaprine (FLEXERIL) 10 MG tablet Take 0.5-1 tablets (5-10 mg total) by mouth 2 (two) times daily as needed for muscle spasms (sedation precautions). 30 tablet 1  ? fluticasone (FLONASE) 50 MCG/ACT nasal spray Place 2 sprays into both nostrils daily. 48 g 3  ? glucose blood (CONTOUR NEXT TEST) test strip Use as instructed to check blood sugar once daily. 100 each 3  ? levothyroxine (SYNTHROID) 200 MCG tablet Take 1 tablet (200 mcg total) by mouth daily before breakfast. One day a week take 1/2 tab (176mg) 90 tablet 3  ? losartan (COZAAR) 100 MG tablet Take 1 tablet (100 mg total) by mouth daily. 90  tablet 3  ? metFORMIN (GLUCOPHAGE) 1000 MG tablet Take 1 tablet (1,000 mg total) by mouth 2 (two) times daily with a meal. 180 tablet 3  ? Microlet Lancets MISC Use as directed to check blood sugars once daily. 100 each 3  ? naproxen (NAPROSYN) 500 MG tablet TAKE 1 TABLET BY MOUTH TWICE A DAY FOR 1 WEEK THEN AS NEEDED FOR PAIN TAKE WITH FOOD 50 tablet 0  ? pioglitazone (ACTOS) 15 MG tablet Take 1 tablet (15 mg total) by mouth daily. 90 tablet 3  ? Semaglutide, 1 MG/DOSE, (OZEMPIC, 1 MG/DOSE,) 4 MG/3ML SOPN Inject 1 mg into the skin once a week. 9 mL 3  ? ?No current facility-administered medications for this visit.  ? ? ?Patient confirms/reports the following allergies:  ?Allergies  ?Allergen Reactions  ? Penicillins Rash  ?  Has patient had a PCN reaction causing immediate rash, facial/tongue/throat swelling, SOB or lightheadedness with hypotension: Yes ?Has patient had a PCN reaction causing severe rash involving mucus membranes or skin necrosis: No ?Has patient had a PCN reaction that required hospitalization: Unknown ?Has patient had a PCN reaction occurring within the last 10 years: No ?If all of the above answers are "NO", then may proceed with Cephalosporin use. ?  ? Lisinopril   ?  Throat irritation, dry cough  ? ? ?No orders of the defined types were placed in this encounter. ? ? ?AUTHORIZATION INFORMATION ?Primary Insurance: ?1D#: ?Group #: ? ?Secondary Insurance: ?1D#: ?Group #: ? ?SCHEDULE INFORMATION: ?Date: 04/02/22 ?Time: ?Location: [facility] ?

## 2022-03-08 DIAGNOSIS — M5416 Radiculopathy, lumbar region: Secondary | ICD-10-CM | POA: Diagnosis not present

## 2022-03-13 IMAGING — US US FNA BIOPSY THYROID 1ST LESION
1 series · 12 of 12 positions shown · non-contrast
Comparison: Ultrasound thyroid from July 15, 2019

MEDICATIONS:
Lidocaine 1% 5 mL

COMPLICATIONS:
None immediate.

INDICATION: Indeterminate thyroid nodule

EXAM:
ULTRASOUND GUIDED FINE NEEDLE ASPIRATION OF INDETERMINATE THYROID
NODULE
TECHNIQUE: Informed written consent was obtained from the patient after a
discussion of the risks, benefits and alternatives to treatment.
Questions regarding the procedure were encouraged and answered. A
timeout was performed prior to the initiation of the procedure.

[Series 1: us thyroid biopsy · 12 acquisitions, 12 frames shown]
[im 1/12]
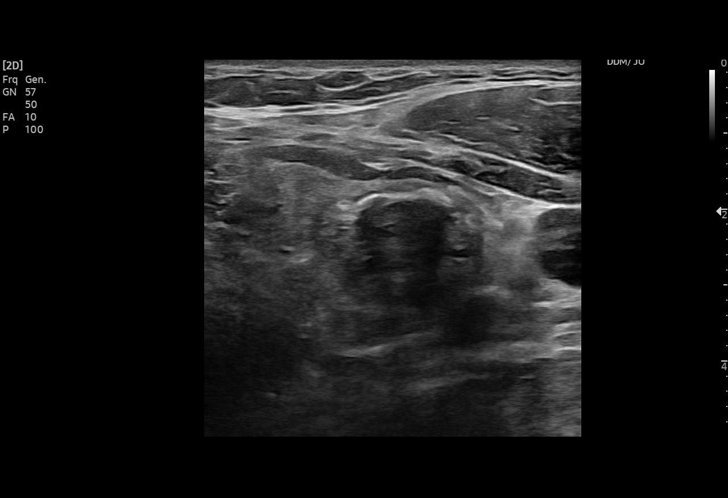
[im 2/12]
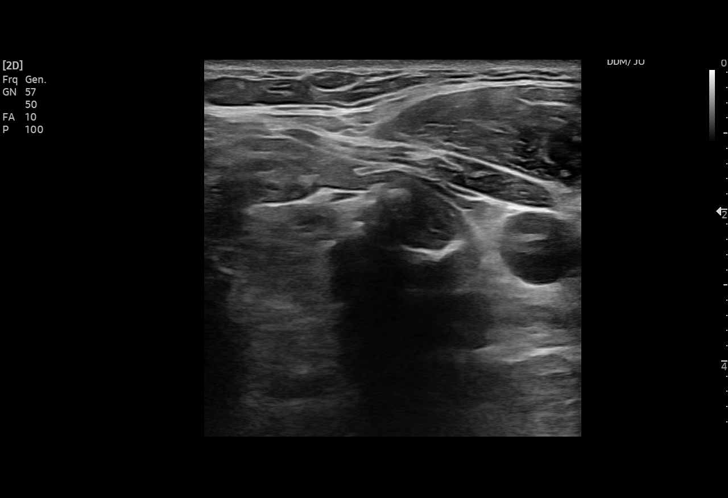
[im 3/12]
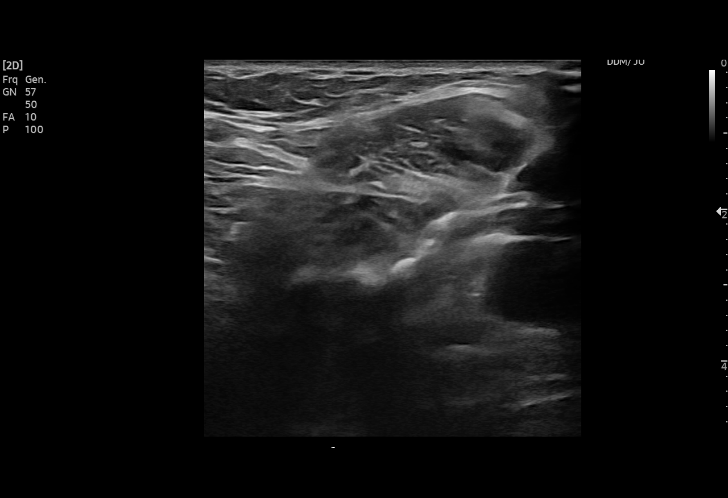
[im 4/12]
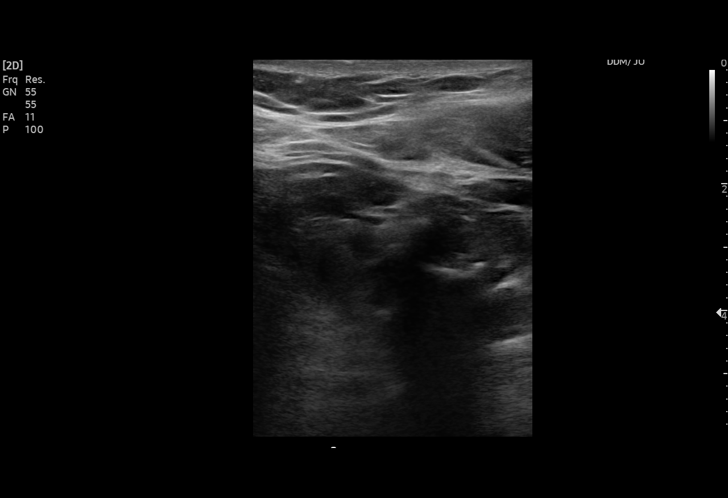
[im 5/12]
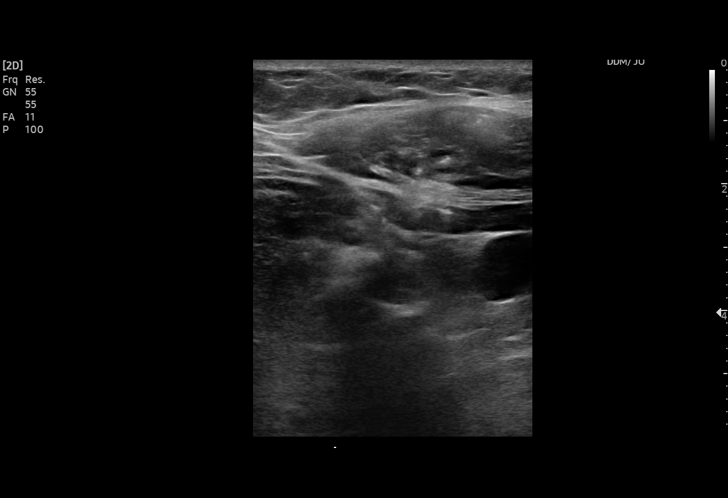
[im 6/12]
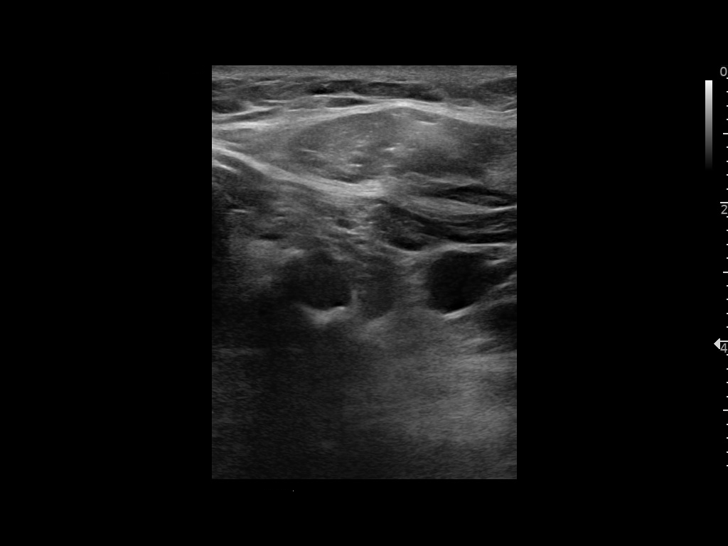
[im 7/12]
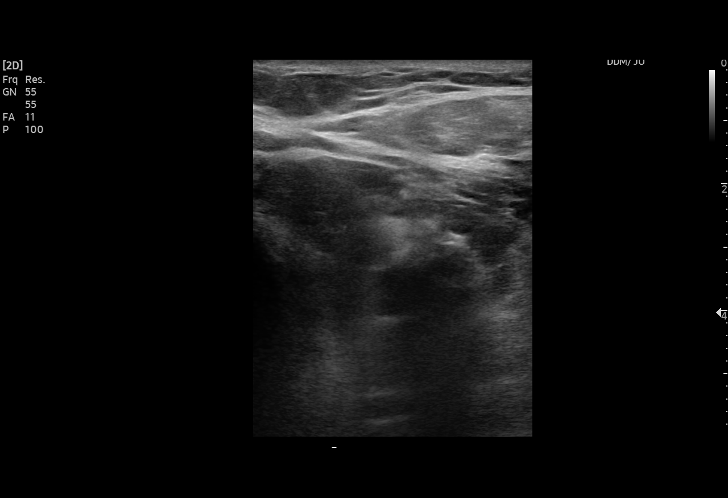
[im 8/12]
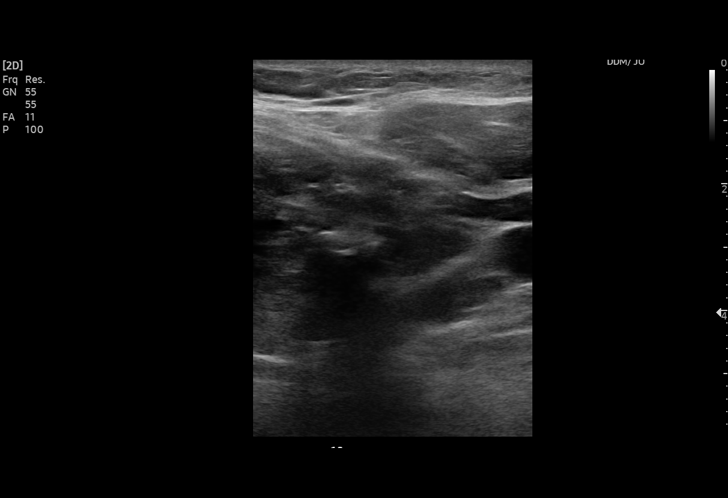
[im 9/12]
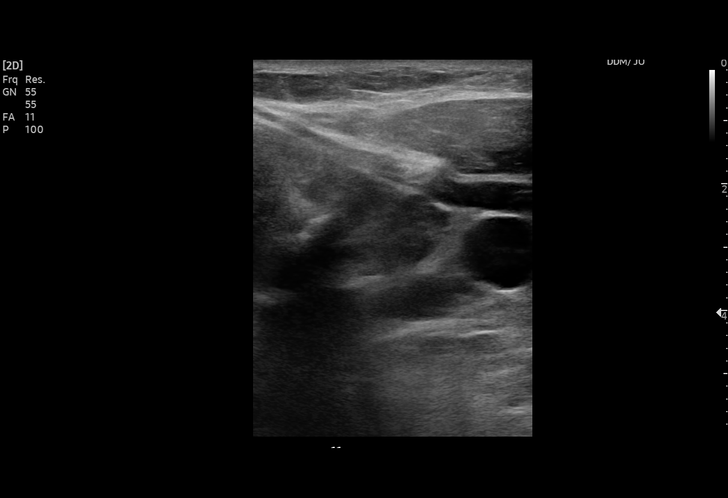
[im 10/12]
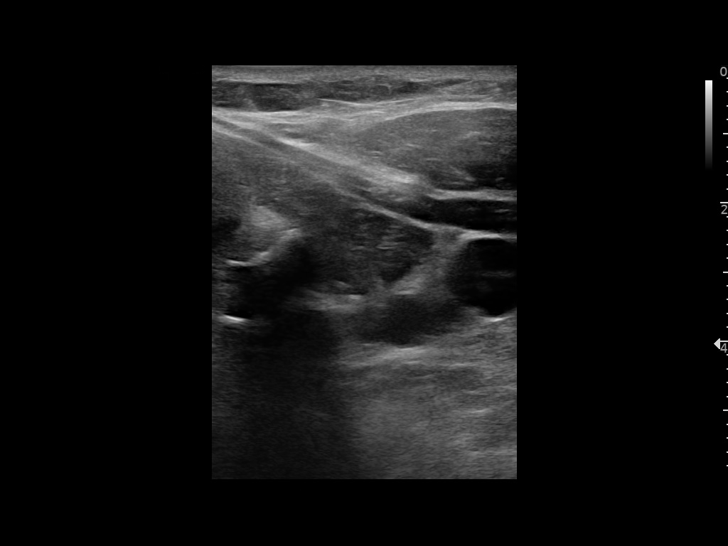
[im 11/12]
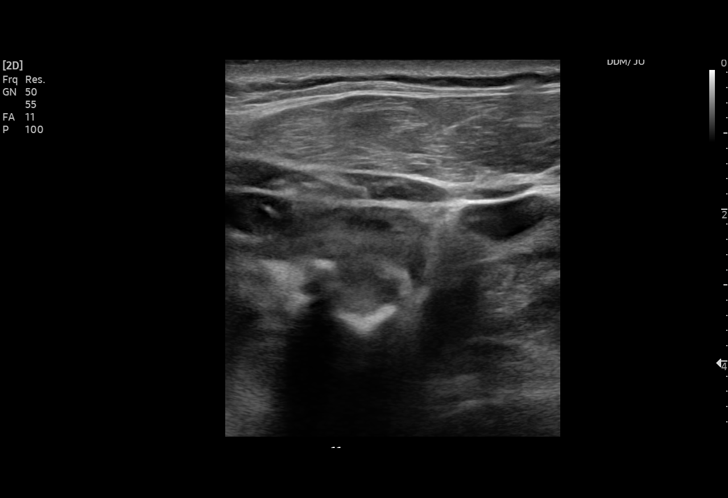
[im 12/12]
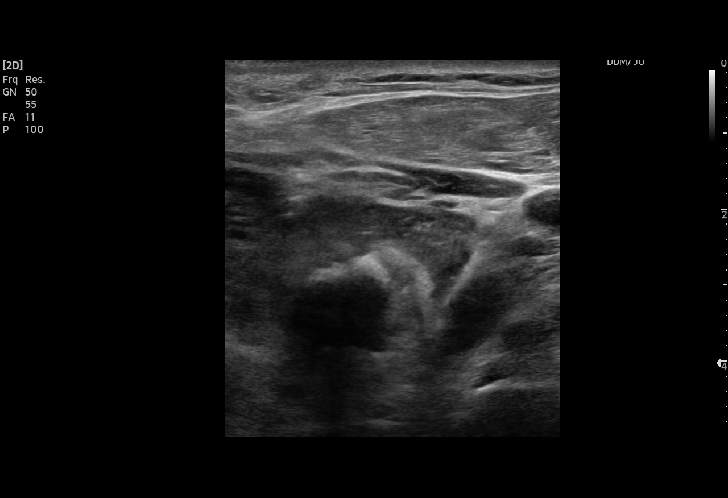

[12 of 12 positions shown; findings below may reference images not displayed]

Pre-procedural ultrasound scanning demonstrated unchanged size and
appearance of the indeterminate nodule within the left inferior lobe

The procedure was planned. The neck was prepped in the usual sterile
fashion, and a sterile drape was applied covering the operative
field. A timeout was performed prior to the initiation of the
procedure. Local anesthesia was provided with 1% lidocaine.

Under direct ultrasound guidance, 9 FNA biopsies were performed of
the left inferior nodule with a 25 gauge needle.

Two samples were sent to [facility] per ordering TANISE.

Multiple ultrasound images were saved for procedural documentation
purposes. The samples were prepared and submitted to pathology.

Limited post procedural scanning was negative for hematoma or
additional complication. Dressings were placed. The patient
tolerated the above procedures procedure well without immediate
postprocedural complication.
FINDINGS: Nodule reference number based on prior diagnostic ultrasound: 1

Maximum size: 2.7 cm

Location: Left; Inferior

ACR TI-RADS risk category: TR5 (>/= 7 points)

Reason for biopsy: meets ACR TI-RADS criteria

Ultrasound imaging confirms appropriate placement of the needles
within the thyroid nodule.
IMPRESSION: Technically successful ultrasound guided fine needle aspiration of
left inferior thyroid nodule

Read by: Anna-Mi Kiruk, NP

## 2022-03-15 DIAGNOSIS — M722 Plantar fascial fibromatosis: Secondary | ICD-10-CM | POA: Diagnosis not present

## 2022-04-02 ENCOUNTER — Ambulatory Visit: Payer: BC Managed Care – PPO | Admitting: Certified Registered Nurse Anesthetist

## 2022-04-02 ENCOUNTER — Encounter
Admission: RE | Disposition: A | Discharge status: Home / Self Care | Payer: Self-pay | Source: Home / Self Care | Attending: Gastroenterology

## 2022-04-02 ENCOUNTER — Ambulatory Visit
Admission: RE | Admit: 2022-04-02 | Discharge: 2022-04-02 | Disposition: A | Discharge status: Home / Self Care | Payer: BC Managed Care – PPO | Attending: Gastroenterology | Admitting: Gastroenterology

## 2022-04-02 ENCOUNTER — Encounter: Payer: Self-pay | Admitting: Gastroenterology

## 2022-04-02 DIAGNOSIS — Z1211 Encounter for screening for malignant neoplasm of colon: Secondary | ICD-10-CM | POA: Insufficient documentation

## 2022-04-02 DIAGNOSIS — E890 Postprocedural hypothyroidism: Secondary | ICD-10-CM | POA: Insufficient documentation

## 2022-04-02 DIAGNOSIS — Z7984 Long term (current) use of oral hypoglycemic drugs: Secondary | ICD-10-CM | POA: Insufficient documentation

## 2022-04-02 DIAGNOSIS — K529 Noninfective gastroenteritis and colitis, unspecified: Secondary | ICD-10-CM | POA: Insufficient documentation

## 2022-04-02 DIAGNOSIS — K635 Polyp of colon: Secondary | ICD-10-CM | POA: Diagnosis not present

## 2022-04-02 DIAGNOSIS — I10 Essential (primary) hypertension: Secondary | ICD-10-CM | POA: Diagnosis not present

## 2022-04-02 DIAGNOSIS — Z6841 Body Mass Index (BMI) 40.0 and over, adult: Secondary | ICD-10-CM | POA: Insufficient documentation

## 2022-04-02 DIAGNOSIS — K5730 Diverticulosis of large intestine without perforation or abscess without bleeding: Secondary | ICD-10-CM | POA: Insufficient documentation

## 2022-04-02 DIAGNOSIS — E039 Hypothyroidism, unspecified: Secondary | ICD-10-CM | POA: Diagnosis not present

## 2022-04-02 DIAGNOSIS — Z8371 Family history of colonic polyps: Secondary | ICD-10-CM | POA: Diagnosis not present

## 2022-04-02 DIAGNOSIS — E119 Type 2 diabetes mellitus without complications: Secondary | ICD-10-CM | POA: Insufficient documentation

## 2022-04-02 HISTORY — PX: COLONOSCOPY WITH PROPOFOL: SHX5780

## 2022-04-02 SURGERY — COLONOSCOPY WITH PROPOFOL
Anesthesia: General

## 2022-04-02 MED ORDER — PROPOFOL 500 MG/50ML IV EMUL
INTRAVENOUS | Status: DC | PRN
  Administered 2022-04-02: 150 ug/kg/min via INTRAVENOUS

## 2022-04-02 MED ORDER — SODIUM CHLORIDE 0.9 % IV SOLN
INTRAVENOUS | Status: DC
  Administered 2022-04-02: 1000 mL via INTRAVENOUS

## 2022-04-02 MED ORDER — LIDOCAINE HCL (CARDIAC) PF 100 MG/5ML IV SOSY
PREFILLED_SYRINGE | INTRAVENOUS | Status: DC | PRN
  Administered 2022-04-02: 50 mg via INTRAVENOUS

## 2022-04-02 MED ORDER — PROPOFOL 10 MG/ML IV BOLUS
INTRAVENOUS | Status: DC | PRN
  Administered 2022-04-02: 30 mg via INTRAVENOUS
  Administered 2022-04-02: 70 mg via INTRAVENOUS

## 2022-04-02 NOTE — Op Note (Signed)
[facility] Patient Name: Jorge Boyle Procedure Date: 04/02/2022 8:13 AM MRN: [id number] Account #: [id number] Date of Birth: [birth year] Admit Type: Outpatient Age: 46 Room: [facility] ENDO ROOM 2 Gender: Male Note Status: Finalized Instrument Name: Jasper Riling [id number] Procedure:             Colonoscopy Indications:           Colon cancer screening in patient at increased risk:                         Family history of 1st-degree relative with colon                         polyps before age 48 years Providers:             Jonathon Bellows MD, MD Referring MD:          Ria Bush (Referring MD) Medicines:             Monitored Anesthesia Care Complications:         No immediate complications. Procedure:             Pre-Anesthesia Assessment:                        - Prior to the procedure, a History and Physical was                         performed, and patient medications, allergies and                         sensitivities were reviewed. The patient's tolerance                         of previous anesthesia was reviewed.                        - The risks and benefits of the procedure and the                         sedation options and risks were discussed with the                         patient. All questions were answered and informed                         consent was obtained.                        - ASA Grade Assessment: II - A patient with mild                         systemic disease.                        After obtaining informed consent, the colonoscope was                         passed under direct vision. Throughout the procedure,                         the  patient's blood pressure, pulse, and oxygen                         saturations were monitored continuously. The                         Colonoscope was introduced through the anus and                         advanced to the the cecum, identified by the                          appendiceal orifice. The colonoscopy was performed                         with ease. The patient tolerated the procedure well.                         The quality of the bowel preparation was poor. Findings:      The perianal and digital rectal examinations were normal.      Multiple small and large-mouthed diverticula were found in the sigmoid       colon.      Localized moderate mucosal changes characterized by congestion (edema)       and erythema were found in the sigmoid colon.      A medium-sized polypoid lesion was found in the sigmoid colon. The       lesion was semi-pedunculated. No bleeding was present. not resected due       to inflammation and poor prep Impression:            - Preparation of the colon was poor.                        - Diverticulosis in the sigmoid colon.                        - Localized moderate mucosal changes were found in the                         sigmoid colon secondary to colitis.                        - Polypoid lesion in the sigmoid colon.                        - No specimens collected. Recommendation:        - Discharge patient to home (with escort).                        - Resume previous diet.                        - Continue present medications.                        - Repeat colonoscopy in 3 weeks because the bowel                         preparation was suboptimal. Procedure Code(s):     ---  Professional ---                        [phone], Colonoscopy, flexible; diagnostic, including                         collection of specimen(s) by brushing or washing, when                         performed (separate procedure) Diagnosis Code(s):     --- Professional ---                        Z83.71, Family history of colonic polyps                        K52.9, Noninfective gastroenteritis and colitis,                         unspecified                        D49.0, Neoplasm of unspecified behavior of digestive                          system                        K57.30, Diverticulosis of large intestine without                         perforation or abscess without bleeding CPT copyright 2019 American [employer] Association. All rights reserved. The codes documented in this report are preliminary and upon coder review may  be revised to meet current compliance requirements. Jonathon Bellows, MD Jonathon Bellows MD, MD 04/02/2022 8:33:12 AM This report has been signed electronically. Number of Addenda: 0 Note Initiated On: 04/02/2022 8:13 AM Scope Withdrawal Time: 0 hours 7 minutes 46 seconds  Total Procedure Duration: 0 hours 10 minutes 28 seconds  Estimated Blood Loss:  Estimated blood loss: none.      [facility]

## 2022-04-02 NOTE — Anesthesia Preprocedure Evaluation (Signed)
Anesthesia Evaluation  Patient identified by MRN, date of birth, ID band Patient awake    Reviewed: Allergy & Precautions, NPO status , Patient's Chart, lab work & pertinent test results  Airway Mallampati: III  TM Distance: >3 FB Neck ROM: Full    Dental  (+) Missing, Poor Dentition, Dental Advisory Given   Pulmonary neg pulmonary ROS,    Pulmonary exam normal  + decreased breath sounds      Cardiovascular Exercise Tolerance: Good hypertension, Pt. on medications negative cardio ROS Normal cardiovascular exam Rhythm:Regular     Neuro/Psych  Headaches, negative neurological ROS  negative psych ROS   GI/Hepatic negative GI ROS, Neg liver ROS,   Endo/Other  negative endocrine ROSdiabetes, Well Controlled, Type 2, Oral Hypoglycemic AgentsHypothyroidism Morbid obesity  Renal/GU negative Renal ROS  negative genitourinary   Musculoskeletal negative musculoskeletal ROS (+)   Abdominal (+) + obese,   Peds negative pediatric ROS (+)  Hematology negative hematology ROS (+)   Anesthesia Other Findings Past Medical History: No date: Allergic rhinitis No date: Diabetes mellitus without complication (HCC) No date: Difficult intubation     Comment:  PT DOES NOT REMEMBER THIS No date: Headache No date: History of chicken pox No date: History of kidney stones No date: Hypertension 01/2012: Multiple thyroid nodules     Comment:  left, biopsy x2 benign, rpt [place] stable (01/2013, 07/2015)               rec rpt 1 yr No date: Nontoxic nodular goiter No date: Obesity No date: Prediabetes 2005: S/P excision of thyroid adenoma     Comment:  R thyroidectomy  Past Surgical History: 02/02/2018: CYSTOSCOPY/URETEROSCOPY/HOLMIUM LASER/STENT PLACEMENT;  Left     Comment:  Procedure: CYSTOSCOPY/URETEROSCOPY/HOLMIUM LASER/STENT               PLACEMENT;  Surgeon: Hollice Espy, MD;  Location: [facility]              ORS;  Service: Urology;   Laterality: Left; No date: HERNIA REPAIR 2005: THYROIDECTOMY, PARTIAL     Comment:  Right and isthmus. Dr Nadeen Landau 1985: [place] 09/2020: TOTAL THYROIDECTOMY; Left     Comment:  path: FOLLICULAR ADENOMA WITH CYSTIC DEGENERATION               Celine Ahr) 62/95/2841: UMBILICAL HERNIA REPAIR     Comment:  [place] 02/02/2018: VENTRAL HERNIA REPAIR; N/A     Comment:  Procedure: LAPAROSCOPIC VENTRAL HERNIA;  Surgeon:               Robert Bellow, MD;  Location: [facility] ORS;  Service:               General;  Laterality: N/A;  BMI    Body Mass Index: 41.93 kg/m      Reproductive/Obstetrics negative OB ROS                             Anesthesia Physical Anesthesia Plan  ASA: 3  Anesthesia Plan: General   Post-op Pain Management:    Induction: Intravenous  PONV Risk Score and Plan: Propofol infusion and TIVA  Airway Management Planned: Natural Airway  Additional Equipment:   Intra-op Plan:   Post-operative Plan:   Informed Consent: I have reviewed the patients History and Physical, chart, labs and discussed the procedure including the risks, benefits and alternatives for the proposed anesthesia with the patient or authorized representative who has indicated his/her  understanding and acceptance.     Dental Advisory Given  Plan Discussed with: CRNA and Surgeon  Anesthesia Plan Comments:         Anesthesia Quick Evaluation

## 2022-04-02 NOTE — Transfer of Care (Signed)
Immediate Anesthesia Transfer of Care Note  Patient: Jorge Boyle  Procedure(s) Performed: COLONOSCOPY WITH PROPOFOL  Patient Location: Endoscopy Unit  Anesthesia Type:General  Level of Consciousness: drowsy  Airway & Oxygen Therapy: Patient Spontanous Breathing  Post-op Assessment: Report given to RN and Post -op Vital signs reviewed and stable  Post vital signs: Reviewed and stable  Last Vitals:  Vitals Value Taken Time  BP 127/84 04/02/22 0833  Temp 36.1 C 04/02/22 0833  Pulse 84 04/02/22 0833  Resp 17 04/02/22 0833  SpO2 95 % 04/02/22 0833  Vitals shown include unvalidated device data.  Last Pain:  Vitals:   04/02/22 0833  TempSrc: Temporal  PainSc: Asleep         Complications: No notable events documented.

## 2022-04-02 NOTE — H&P (Signed)
Jonathon Bellows, MD [address] [place], [place], [place], [address] [address], [place], [place], [place], [address] Phone: [phone]  Fax: [phone]  Primary Care Physician:  Ria Bush, MD   Pre-Procedure History & Physical: HPI:  Jorge Boyle is a 46 y.o. male is here for an colonoscopy.   Past Medical History:  Diagnosis Date   Allergic rhinitis    Diabetes mellitus without complication ([place])    Difficult intubation    PT DOES NOT REMEMBER THIS   Headache    History of chicken pox    History of kidney stones    Hypertension    Multiple thyroid nodules 01/2012   left, biopsy x2 benign, rpt [place] stable (01/2013, 07/2015) rec rpt 1 yr   Nontoxic nodular goiter    Obesity    Prediabetes    S/P excision of thyroid adenoma 2005   R thyroidectomy    Past Surgical History:  Procedure Laterality Date   CYSTOSCOPY/URETEROSCOPY/HOLMIUM LASER/STENT PLACEMENT Left 02/02/2018   Procedure: CYSTOSCOPY/URETEROSCOPY/HOLMIUM LASER/STENT PLACEMENT;  Surgeon: Hollice Espy, MD;  Location: [facility] ORS;  Service: Urology;  Laterality: Left;   HERNIA REPAIR     THYROIDECTOMY, PARTIAL  2005   Right and isthmus. Dr Nadeen Landau   TONSILLECTOMY AND ADENOIDECTOMY  1985   TOTAL THYROIDECTOMY Left 57/8469   path: [place]   UMBILICAL HERNIA REPAIR  12/30/2013   [facility]   VENTRAL HERNIA REPAIR N/A 02/02/2018   Procedure: LAPAROSCOPIC VENTRAL HERNIA;  Surgeon: Robert Bellow, MD;  Location: [facility] ORS;  Service: General;  Laterality: N/A;    Prior to Admission medications   Medication Sig Start Date End Date Taking? Authorizing Provider  atorvastatin (LIPITOR) 20 MG tablet Take 1 tablet (20 mg total) by mouth daily. 09/22/21  Yes Ria Bush, MD  Blood Glucose Monitoring Suppl (CONTOUR NEXT MONITOR) w/Device KIT Use as instructed to check blood sugar once daily. 07/07/20  Yes Ria Bush, MD  colchicine 0.6 MG  tablet TAKE 1 TABLET (0.6 MG TOTAL) BY MOUTH DAILY AS NEEDED (GOUT FLARE). MAY TAKE 2 TABLETS ON FIRST DAY OF USE 10/01/21  Yes Ria Bush, MD  glucose blood (CONTOUR NEXT TEST) test strip Use as instructed to check blood sugar once daily. 03/05/21  Yes Ria Bush, MD  levothyroxine (SYNTHROID) 200 MCG tablet Take 1 tablet (200 mcg total) by mouth daily before breakfast. One day a week take 1/2 tab (159mg) 09/22/21  Yes GRia Bush MD  losartan (COZAAR) 100 MG tablet Take 1 tablet (100 mg total) by mouth daily. 09/22/21  Yes GRia Bush MD  metFORMIN (GLUCOPHAGE) 1000 MG tablet Take 1 tablet (1,000 mg total) by mouth 2 (two) times daily with a meal. 09/22/21  Yes GRia Bush MD  Microlet Lancets MISC Use as directed to check blood sugars once daily. 07/05/20  Yes GRia Bush MD  pioglitazone (ACTOS) 15 MG tablet Take 1 tablet (15 mg total) by mouth daily. 09/22/21  Yes GRia Bush MD  Semaglutide, 1 MG/DOSE, (OZEMPIC, 1 MG/DOSE,) 4 MG/3ML SOPN Inject 1 mg into the skin once a week. 09/22/21  Yes GRia Bush MD  cyclobenzaprine (FLEXERIL) 10 MG tablet Take 0.5-1 tablets (5-10 mg total) by mouth 2 (two) times daily as needed for muscle spasms (sedation precautions). 09/21/21   GRia Bush MD  fluticasone ([facility] 50 MCG/ACT nasal spray Place 2 sprays into both nostrils daily. 09/21/21   GRia Bush MD  naproxen (NAPROSYN) 500 MG tablet  TAKE 1 TABLET BY MOUTH TWICE A DAY FOR 1 WEEK THEN AS NEEDED FOR PAIN TAKE WITH FOOD Patient not taking: Reported on 04/02/2022 01/30/22   Ria Bush, MD    Allergies as of 03/01/2022 - Review Complete 09/21/2021  Allergen Reaction Noted   Penicillins Rash 01/01/2012   Lisinopril  06/29/2018    Family History  Problem Relation Age of Onset   Diabetes Paternal Grandmother    Cancer Paternal Grandmother 47       breast   Diabetes Mother    Thyroid disease Mother    Diabetes Father    Thyroid  disease Father    Stroke Maternal Grandmother    Diabetes Maternal Grandfather    Cancer Other        stomach, lung (smoker)   Coronary artery disease Neg Hx     Social History   Socioeconomic History   Marital status: Legally Separated    Spouse name: Not on file   Number of children: Not on file   Years of education: Not on file   Highest education level: Not on file  Occupational History   Not on file  Tobacco Use   Smoking status: Never   Smokeless tobacco: Never  Vaping Use   Vaping Use: Never used  Substance and Sexual Activity   Alcohol use: Yes    Alcohol/week: 0.0 standard drinks of alcohol    Comment: rare   Drug use: No   Sexual activity: Not on file  Other Topics Concern   Not on file  Social History Narrative   Caffeine: 1-2 cups /day   Lives with twin brother, 2 dogs   Occupation: Journalist, 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)   Edu: HS   Activity: goes to gym 3x/wk   Diet: good water, fruits/vegetables some    Social Determinants of Radio broadcast assistant Strain: Not on file  Food Insecurity: Not on file  Transportation Needs: Not on file  Physical Activity: Not on file  Stress: Not on file  Social Connections: Not on file  Intimate Partner Violence: Not on file    Review of Systems: See HPI, otherwise negative ROS  Physical Exam: BP 129/81   Pulse 91   Temp (!) 96.9 F (36.1 C) (Temporal)   Resp 17   Ht 5' 11" (1.803 m)   Wt (!) 136.4 kg   SpO2 98%   BMI 41.93 kg/m  General:   Alert,  pleasant and cooperative in NAD Head:  Normocephalic and atraumatic. Neck:  Supple; no masses or thyromegaly. Lungs:  Clear throughout to auscultation, normal respiratory effort.    Heart:  +S1, +S2, Regular rate and rhythm, No edema. Abdomen:  Soft, nontender and nondistended. Normal bowel sounds, without guarding, and without rebound.   Neurologic:  Alert and  oriented x4;  grossly normal  neurologically.  Impression/Plan: Jorge Boyle is here for an colonoscopy to be performed for Screening colonoscopy average risk   Risks, benefits, limitations, and alternatives regarding  colonoscopy have been reviewed with the patient.  Questions have been answered.  All parties agreeable.   Jonathon Bellows, MD  04/02/2022, 8:14 AM

## 2022-04-02 NOTE — Anesthesia Procedure Notes (Signed)
Procedure Name: MAC Date/Time: 04/02/2022 8:10 AM  Performed by: Tollie Eth, CRNAPre-anesthesia Checklist: Patient identified, Emergency Drugs available, Suction available and Patient being monitored Patient Re-evaluated:Patient Re-evaluated prior to induction Oxygen Delivery Method: Simple face mask Induction Type: IV induction Placement Confirmation: positive ETCO2

## 2022-04-02 NOTE — Anesthesia Postprocedure Evaluation (Signed)
Anesthesia Post Note  Patient: Jorge Boyle  Procedure(s) Performed: COLONOSCOPY WITH PROPOFOL  Patient location during evaluation: PACU Anesthesia Type: General Level of consciousness: awake, oriented and awake and alert Pain management: pain level controlled Vital Signs Assessment: post-procedure vital signs reviewed and stable Cardiovascular status: stable Anesthetic complications: no   No notable events documented.   Last Vitals:  Vitals:   04/02/22 0749 04/02/22 0833  BP: 129/81 127/84  Pulse: 91 83  Resp: 17   Temp: (!) 36.1 C (!) 36.1 C  SpO2: 98% 95%    Last Pain:  Vitals:   04/02/22 0903  TempSrc:   PainSc: 0-No pain                 VAN STAVEREN,Daine Gunther

## 2022-04-03 ENCOUNTER — Encounter: Payer: Self-pay | Admitting: Gastroenterology

## 2022-04-03 DIAGNOSIS — E119 Type 2 diabetes mellitus without complications: Secondary | ICD-10-CM | POA: Diagnosis not present

## 2022-04-03 DIAGNOSIS — M5416 Radiculopathy, lumbar region: Secondary | ICD-10-CM | POA: Diagnosis not present

## 2022-04-05 ENCOUNTER — Encounter: Payer: Self-pay | Admitting: Family Medicine

## 2022-04-15 ENCOUNTER — Other Ambulatory Visit: Payer: Self-pay

## 2022-04-15 DIAGNOSIS — Z86010 Personal history of colonic polyps: Secondary | ICD-10-CM

## 2022-04-15 MED ORDER — PEG 3350-KCL-NA BICARB-NACL 420 G PO SOLR: 4000.0000 mL | Freq: Once | ORAL | 0 refills | Status: AC

## 2022-04-15 NOTE — Progress Notes (Signed)
Rescheduled

## 2022-04-22 ENCOUNTER — Telehealth: Payer: Self-pay | Admitting: Family Medicine

## 2022-04-22 NOTE — Telephone Encounter (Signed)
error 

## 2022-04-25 ENCOUNTER — Encounter: Payer: Self-pay | Admitting: Gastroenterology

## 2022-04-25 ENCOUNTER — Ambulatory Visit: Payer: BC Managed Care – PPO | Admitting: Anesthesiology

## 2022-04-25 ENCOUNTER — Encounter
Admission: RE | Disposition: A | Discharge status: Home / Self Care | Payer: Self-pay | Source: Home / Self Care | Attending: Gastroenterology

## 2022-04-25 ENCOUNTER — Other Ambulatory Visit: Payer: Self-pay

## 2022-04-25 ENCOUNTER — Ambulatory Visit
Admission: RE | Admit: 2022-04-25 | Discharge: 2022-04-25 | Disposition: A | Discharge status: Home / Self Care | Payer: BC Managed Care – PPO | Attending: Gastroenterology | Admitting: Gastroenterology

## 2022-04-25 DIAGNOSIS — Z1211 Encounter for screening for malignant neoplasm of colon: Secondary | ICD-10-CM | POA: Insufficient documentation

## 2022-04-25 DIAGNOSIS — Z539 Procedure and treatment not carried out, unspecified reason: Secondary | ICD-10-CM | POA: Diagnosis not present

## 2022-04-25 DIAGNOSIS — Z86010 Personal history of colonic polyps: Secondary | ICD-10-CM

## 2022-04-25 HISTORY — PX: COLONOSCOPY WITH PROPOFOL: SHX5780

## 2022-04-25 SURGERY — COLONOSCOPY WITH PROPOFOL
Anesthesia: General

## 2022-04-25 MED ORDER — SODIUM CHLORIDE 0.9 % IV SOLN: INTRAVENOUS | Status: DC

## 2022-04-25 NOTE — Progress Notes (Signed)
Patient here today for Colonoscopy with Dr Vicente Males.  After he was brought back to ENDO for preop, his mother, while waiting in the Surgery Waiting Area, began experiencing shortness of breath that was not relived by her rescue inhaler after 2 administrations. She was then taken to the Emergency Room for evaluation.  Being that the patient did not have any other person to provide his transportation home after his scheduled Colonoscopy, the patient himself decided to reschedule the procedure.  Dr Vicente Males did speak with the patient prior to his discharge.  I escorted the patient to the Emergency Room 2 where his mother is being evaluated.  Office to call with reschedule date.

## 2022-04-25 NOTE — Anesthesia Preprocedure Evaluation (Deleted)
Anesthesia Evaluation  Patient identified by MRN, date of birth, ID band Patient awake    Reviewed: Allergy & Precautions, NPO status , Patient's Chart, lab work & pertinent test results  History of Anesthesia Complications (+) DIFFICULT AIRWAY and history of anesthetic complications  Airway Mallampati: III  TM Distance: >3 FB Neck ROM: Full    Dental  (+) Missing, Poor Dentition, Dental Advisory Given   Pulmonary neg pulmonary ROS,    Pulmonary exam normal  + decreased breath sounds      Cardiovascular Exercise Tolerance: Good hypertension, Pt. on medications negative cardio ROS Normal cardiovascular exam Rhythm:Regular     Neuro/Psych  Headaches, negative neurological ROS  negative psych ROS   GI/Hepatic negative GI ROS, Neg liver ROS,   Endo/Other  negative endocrine ROSdiabetes, Well Controlled, Type 2, Oral Hypoglycemic AgentsHypothyroidism Morbid obesity  Renal/GU negative Renal ROS  negative genitourinary   Musculoskeletal negative musculoskeletal ROS (+)   Abdominal (+) + obese,   Peds negative pediatric ROS (+)  Hematology negative hematology ROS (+)   Anesthesia Other Findings Past Medical History: No date: Allergic rhinitis No date: Diabetes mellitus without complication (HCC) No date: Difficult intubation     Comment:  PT DOES NOT REMEMBER THIS No date: Headache No date: History of chicken pox No date: History of kidney stones No date: Hypertension 01/2012: Multiple thyroid nodules     Comment:  left, biopsy x2 benign, rpt [place] stable (01/2013, 07/2015)               rec rpt 1 yr No date: Nontoxic nodular goiter No date: Obesity No date: Prediabetes 2005: S/P excision of thyroid adenoma     Comment:  R thyroidectomy  Past Surgical History: 02/02/2018: CYSTOSCOPY/URETEROSCOPY/HOLMIUM LASER/STENT PLACEMENT;  Left     Comment:  Procedure: CYSTOSCOPY/URETEROSCOPY/HOLMIUM LASER/STENT                PLACEMENT;  Surgeon: Hollice Espy, MD;  Location: [facility]              ORS;  Service: Urology;  Laterality: Left; No date: HERNIA REPAIR 2005: THYROIDECTOMY, PARTIAL     Comment:  Right and isthmus. Dr Nadeen Landau 1985: [place] 09/2020: TOTAL THYROIDECTOMY; Left     Comment:  path: FOLLICULAR ADENOMA WITH CYSTIC DEGENERATION               Celine Ahr) 24/23/5361: UMBILICAL HERNIA REPAIR     Comment:  [place] 02/02/2018: VENTRAL HERNIA REPAIR; N/A     Comment:  Procedure: LAPAROSCOPIC VENTRAL HERNIA;  Surgeon:               Robert Bellow, MD;  Location: [facility] ORS;  Service:               General;  Laterality: N/A;  BMI    Body Mass Index: 41.93 kg/m      Reproductive/Obstetrics negative OB ROS                             Anesthesia Physical  Anesthesia Plan  ASA: 3  Anesthesia Plan: General   Post-op Pain Management:    Induction: Intravenous  PONV Risk Score and Plan: Propofol infusion and TIVA  Airway Management Planned: Natural Airway  Additional Equipment:   Intra-op Plan:   Post-operative Plan:   Informed Consent: I have reviewed the patients History and Physical, chart, labs and discussed the procedure including the risks, benefits and alternatives  for the proposed anesthesia with the patient or authorized representative who has indicated his/her understanding and acceptance.     Dental Advisory Given  Plan Discussed with: CRNA and Surgeon  Anesthesia Plan Comments:         Anesthesia Quick Evaluation

## 2022-04-26 ENCOUNTER — Encounter: Payer: Self-pay | Admitting: Gastroenterology

## 2022-05-06 ENCOUNTER — Encounter: Payer: Self-pay | Admitting: Family Medicine

## 2022-05-07 ENCOUNTER — Other Ambulatory Visit: Payer: Self-pay | Admitting: Nephrology

## 2022-05-07 DIAGNOSIS — R809 Proteinuria, unspecified: Secondary | ICD-10-CM

## 2022-05-07 DIAGNOSIS — K760 Fatty (change of) liver, not elsewhere classified: Secondary | ICD-10-CM

## 2022-05-07 DIAGNOSIS — E785 Hyperlipidemia, unspecified: Secondary | ICD-10-CM

## 2022-05-07 DIAGNOSIS — E119 Type 2 diabetes mellitus without complications: Secondary | ICD-10-CM

## 2022-05-07 NOTE — Telephone Encounter (Signed)
I spoke with Diane (DPR signed) Diane said panic attacks started last wk or the wk before. No reason for panic attack per Diane. Diane said all the family has some form of anxiety. No SI/HI. Scheduled appt with Dr Darnell Level on 05/08/22 at 4 PM to discuss and eval panic attacks. UC & ED precautions given and Diane voiced understanding. Sending note to Dr Darnell Level and Lattie Haw CMA.

## 2022-05-08 ENCOUNTER — Ambulatory Visit: Payer: BC Managed Care – PPO | Admitting: Family Medicine

## 2022-05-08 ENCOUNTER — Encounter: Payer: Self-pay | Admitting: Family Medicine

## 2022-05-08 VITALS — BP 138/86 | HR 95 | Temp 97.9°F | Ht 71.0 in | Wt 307.2 lb

## 2022-05-08 DIAGNOSIS — R0602 Shortness of breath: Secondary | ICD-10-CM | POA: Diagnosis not present

## 2022-05-08 DIAGNOSIS — E785 Hyperlipidemia, unspecified: Secondary | ICD-10-CM

## 2022-05-08 DIAGNOSIS — I10 Essential (primary) hypertension: Secondary | ICD-10-CM

## 2022-05-08 DIAGNOSIS — E1169 Type 2 diabetes mellitus with other specified complication: Secondary | ICD-10-CM

## 2022-05-08 DIAGNOSIS — E1140 Type 2 diabetes mellitus with diabetic neuropathy, unspecified: Secondary | ICD-10-CM

## 2022-05-08 DIAGNOSIS — R0789 Other chest pain: Secondary | ICD-10-CM

## 2022-05-08 DIAGNOSIS — E039 Hypothyroidism, unspecified: Secondary | ICD-10-CM

## 2022-05-08 DIAGNOSIS — I517 Cardiomegaly: Secondary | ICD-10-CM | POA: Diagnosis not present

## 2022-05-08 DIAGNOSIS — F410 Panic disorder [episodic paroxysmal anxiety] without agoraphobia: Secondary | ICD-10-CM

## 2022-05-08 DIAGNOSIS — R400 Somnolence: Secondary | ICD-10-CM

## 2022-05-08 MED ORDER — CONTOUR NEXT TEST VI STRP: ORAL_STRIP | 3 refills | Status: AC

## 2022-05-08 MED ORDER — HYDROXYZINE HCL 25 MG PO TABS: 12.5000 mg | ORAL_TABLET | Freq: Two times a day (BID) | ORAL | 0 refills | Status: DC | PRN

## 2022-05-08 NOTE — Assessment & Plan Note (Addendum)
Likely does have OSA which could be exacerbating anxiety at night - # again provided to call and schedule sleep evaluation.

## 2022-05-08 NOTE — Assessment & Plan Note (Addendum)
Anticipate anxiety attack, possibly triggered by orthopnea as it seems to come on when he tries to lay down to go to sleep - see below.  Will Rx hydroxyzine to have PRN. Update me with effect, consider benzo if ongoing.  Recommend call and schedule pulm appt for sleep apnea evaluation.

## 2022-05-08 NOTE — Assessment & Plan Note (Signed)
Continues statin 

## 2022-05-08 NOTE — Progress Notes (Signed)
Patient ID: Jorge Boyle, male    DOB: 05/14/1976, 46 y.o.   MRN: [id number]  This visit was conducted in person.  BP 138/86   Pulse 95   Temp 97.9 F (36.6 C) (Temporal)   Ht '5\' 11"'  (1.803 m)   Wt (!) 307 lb 4 oz (139.4 kg)   SpO2 98%   BMI 42.85 kg/m    CC: possible panic attack  Subjective:   HPI: Jorge Boyle is a 46 y.o. male presenting on 05/08/2022 for Panic Attack (C/o 3-4 panic attacks. Started 1-2 wks ago.  Fmhx of anxiety. )   Sunday night 9pm at bedtime had episode of chest tightness, shortness of breath "couldn't breath", "flipping out"  BP was 175/110.  Laid in couch, put ice packs to neck. Took brother's anxiety medicine with benefit.   Had another episode of anxiety attack when he went to visit a friend overnight.  Also had episode of panic when medical bracelet placed for colonoscopy.   Episodes seem to happen when he lays down. Notes some stress/anxiety while at work - but he is able to handle/manage this.   Trouble getting time to schedule appointments.   DM - continues metformin 1072m bid, actos 167m and oempic 72m372meekly.      Relevant past medical, surgical, family and social history reviewed and updated as indicated. Interim medical history since our last visit reviewed. Allergies and medications reviewed and updated. Outpatient Medications Prior to Visit  Medication Sig Dispense Refill   atorvastatin (LIPITOR) 20 MG tablet Take 1 tablet (20 mg total) by mouth daily. 90 tablet 3   Blood Glucose Monitoring Suppl (CONTOUR NEXT MONITOR) w/Device KIT Use as instructed to check blood sugar once daily. 1 kit 0   colchicine 0.6 MG tablet TAKE 1 TABLET (0.6 MG TOTAL) BY MOUTH DAILY AS NEEDED (GOUT FLARE). MAY TAKE 2 TABLETS ON FIRST DAY OF USE 30 tablet 3   cyclobenzaprine (FLEXERIL) 10 MG tablet Take 0.5-1 tablets (5-10 mg total) by mouth 2 (two) times daily as needed for muscle spasms (sedation precautions). 30 tablet 1   fluticasone (FLONASE)  50 MCG/ACT nasal spray Place 2 sprays into both nostrils daily. 48 g 3   levothyroxine (SYNTHROID) 200 MCG tablet Take 1 tablet (200 mcg total) by mouth daily before breakfast. One day a week take 1/2 tab (100m33m90 tablet 3   losartan (COZAAR) 100 MG tablet Take 1 tablet (100 mg total) by mouth daily. 90 tablet 3   metFORMIN (GLUCOPHAGE) 1000 MG tablet Take 1 tablet (1,000 mg total) by mouth 2 (two) times daily with a meal. 180 tablet 3   Microlet Lancets MISC Use as directed to check blood sugars once daily. 100 each 3   naproxen (NAPROSYN) 500 MG tablet TAKE 1 TABLET BY MOUTH TWICE A DAY FOR 1 WEEK THEN AS NEEDED FOR PAIN TAKE WITH FOOD 50 tablet 0   pioglitazone (ACTOS) 15 MG tablet Take 1 tablet (15 mg total) by mouth daily. 90 tablet 3   Semaglutide, 1 MG/DOSE, (OZEMPIC, 1 MG/DOSE,) 4 MG/3ML SOPN Inject 1 mg into the skin once a week. 9 mL 3   glucose blood (CONTOUR NEXT TEST) test strip Use as instructed to check blood sugar once daily. 100 each 3   No facility-administered medications prior to visit.     Per HPI unless specifically indicated in ROS section below Review of Systems  Objective:  BP 138/86   Pulse 95   Temp 97.9  F (36.6 C) (Temporal)   Ht '5\' 11"'  (1.803 m)   Wt (!) 307 lb 4 oz (139.4 kg)   SpO2 98%   BMI 42.85 kg/m   Wt Readings from Last 3 Encounters:  05/08/22 (!) 307 lb 4 oz (139.4 kg)  04/25/22 300 lb (136.1 kg)  04/02/22 (!) 300 lb 9.9 oz (136.4 kg)      Physical Exam Vitals and nursing note reviewed.  Constitutional:      Appearance: Normal appearance. He is obese. He is not ill-appearing.  HENT:     Head: Normocephalic and atraumatic.     Mouth/Throat:     Mouth: Mucous membranes are moist.     Pharynx: Oropharynx is clear. No oropharyngeal exudate or posterior oropharyngeal erythema.  Eyes:     Extraocular Movements: Extraocular movements intact.     Pupils: Pupils are equal, round, and reactive to light.  Neck:     Thyroid: No thyroid mass  or thyromegaly.  Cardiovascular:     Rate and Rhythm: Normal rate and regular rhythm.     Pulses: Normal pulses.     Heart sounds: Normal heart sounds. No murmur heard. Pulmonary:     Effort: Pulmonary effort is normal. No respiratory distress.     Breath sounds: Normal breath sounds. No wheezing, rhonchi or rales.  Musculoskeletal:     Right lower leg: No edema.     Left lower leg: No edema.  Skin:    General: Skin is warm and dry.     Findings: No rash.  Neurological:     Mental Status: He is alert.  Psychiatric:        Mood and Affect: Mood normal.        Behavior: Behavior normal.        Thought Content: Thought content normal.       Results for orders placed or performed in visit on 12/13/21  HM DIABETES EYE EXAM  Result Value Ref Range   HM Diabetic Eye Exam No Retinopathy No Retinopathy   Lab Results  Component Value Date   CHOL 109 09/21/2021   HDL 22 (L) 09/21/2021   LDLCALC 72 09/21/2021   LDLDIRECT 126 05/21/2013   TRIG 70 09/21/2021   CHOLHDL 5.0 (H) 09/21/2021   EKG - NSR rate 80s, normal axis and intervals, no hypertrophy or acute ST/T changes.    Assessment & Plan:   Problem List Items Addressed This Visit     Obesity, morbid, BMI 40.0-49.9 ([place])   Type 2 diabetes mellitus with diabetic neuropathy, unspecified ([place])    Update A1c on actos, ozempic 43m, metformin. Pending results, consider titrating ozempic and stopping actos.       Relevant Medications   glucose blood (CONTOUR NEXT TEST) test strip   Other Relevant Orders   Comprehensive metabolic panel   Hemoglobin A1c   Dyslipidemia associated with type 2 diabetes mellitus (HCC)    Continues statin.      Essential hypertension    Stable period on losartan.       Acquired hypothyroidism    Latest TSH low - levothyroxine dose dropped to 2080m daily, once weekly 10072m Update TSH, fT4. overtreated thyroid could contribute to today's symptoms.      Relevant Orders   TSH   CBC with  Differential/Platelet   T4, free   Cardiomegaly    H/o this on CXR. No significant pedal edema on exam today, but ?orthopnea contributing to anxiety attack - will add BNP to blood  work.       Relevant Orders   Brain natriuretic peptide   Daytime somnolence    Likely does have OSA which could be exacerbating anxiety at night - # again provided to call and schedule sleep evaluation.       Anxiety attack - Primary    Anticipate anxiety attack, possibly triggered by orthopnea as it seems to come on when he tries to lay down to go to sleep - see below.  Will Rx hydroxyzine to have PRN. Update me with effect, consider benzo if ongoing.  Recommend call and schedule pulm appt for sleep apnea evaluation.       Relevant Medications   hydrOXYzine (ATARAX) 25 MG tablet   Other Relevant Orders   EKG 12-Lead (Completed)   Other Visit Diagnoses     Chest tightness       Relevant Orders   EKG 12-Lead (Completed)   Brain natriuretic peptide   Shortness of breath       Relevant Orders   Brain natriuretic peptide        Meds ordered this encounter  Medications   glucose blood (CONTOUR NEXT TEST) test strip    Sig: Use as instructed to check blood sugar once daily.    Dispense:  100 each    Refill:  3   hydrOXYzine (ATARAX) 25 MG tablet    Sig: Take 0.5-1 tablets (12.5-25 mg total) by mouth 2 (two) times daily as needed for anxiety (sedation precautions).    Dispense:  30 tablet    Refill:  0   Orders Placed This Encounter  Procedures   Comprehensive metabolic panel   TSH   Hemoglobin A1c   CBC with Differential/Platelet   T4, free   Brain natriuretic peptide   EKG 12-Lead     Patient Instructions  EKG today Labs today  Possible anxiety attack, want to rule out other issue like sleep apnea.  Call [facility] in [place] at [phone]  to schedule sleep apnea evaluation.  Try hydroxyzine 37m as needed for anxiety - let [place] how you do with this   Follow  up plan: Return if symptoms worsen or fail to improve.  JRia Bush MD

## 2022-05-08 NOTE — Patient Instructions (Addendum)
EKG today Labs today  Possible anxiety attack, want to rule out other issue like sleep apnea.  Call [facility] in [place] at [phone]  to schedule sleep apnea evaluation.  Try hydroxyzine '25mg'$  as needed for anxiety - let us know how you do with this

## 2022-05-08 NOTE — Assessment & Plan Note (Addendum)
H/o this on CXR. No significant pedal edema on exam today, but ?orthopnea contributing to anxiety attack - will add BNP to blood work.

## 2022-05-08 NOTE — Assessment & Plan Note (Addendum)
Latest TSH low - levothyroxine dose dropped to 210mg daily, once weekly 1028m. Update TSH, fT4. overtreated thyroid could contribute to today's symptoms.

## 2022-05-08 NOTE — Assessment & Plan Note (Signed)
Update A1c on actos, ozempic '1mg'$ , metformin. Pending results, consider titrating ozempic and stopping actos.

## 2022-05-08 NOTE — Assessment & Plan Note (Signed)
Stable period on losartan.

## 2022-05-09 ENCOUNTER — Telehealth: Payer: Self-pay

## 2022-05-09 ENCOUNTER — Other Ambulatory Visit: Payer: Self-pay

## 2022-05-09 ENCOUNTER — Telehealth: Payer: Self-pay | Admitting: Gastroenterology

## 2022-05-09 ENCOUNTER — Encounter: Payer: Self-pay | Admitting: Family Medicine

## 2022-05-09 DIAGNOSIS — Z86010 Personal history of colonic polyps: Secondary | ICD-10-CM

## 2022-05-09 LAB — CBC WITH DIFFERENTIAL/PLATELET
Basophils Absolute: 0.1 10*3/uL (ref 0.0–0.1)
Basophils Relative: 0.7 % (ref 0.0–3.0)
Eosinophils Absolute: 0.3 10*3/uL (ref 0.0–0.7)
Eosinophils Relative: 2.2 % (ref 0.0–5.0)
HCT: 40.1 % (ref 39.0–52.0)
Hemoglobin: 13.4 g/dL (ref 13.0–17.0)
Lymphocytes Relative: 18.1 % (ref 12.0–46.0)
Lymphs Abs: 2.1 10*3/uL (ref 0.7–4.0)
MCHC: 33.3 g/dL (ref 30.0–36.0)
MCV: 91.7 fl (ref 78.0–100.0)
Monocytes Absolute: 0.6 10*3/uL (ref 0.1–1.0)
Monocytes Relative: 5.1 % (ref 3.0–12.0)
Neutro Abs: 8.8 10*3/uL — ABNORMAL HIGH (ref 1.4–7.7)
Neutrophils Relative %: 73.9 % (ref 43.0–77.0)
Platelets: 286 10*3/uL (ref 150.0–400.0)
RBC: 4.38 Mil/uL (ref 4.22–5.81)
RDW: 14.2 % (ref 11.5–15.5)
WBC: 11.9 10*3/uL — ABNORMAL HIGH (ref 4.0–10.5)

## 2022-05-09 LAB — COMPREHENSIVE METABOLIC PANEL
ALT: 31 U/L (ref 0–53)
AST: 19 U/L (ref 0–37)
Albumin: 4.2 g/dL (ref 3.5–5.2)
Alkaline Phosphatase: 56 U/L (ref 39–117)
BUN: 14 mg/dL (ref 6–23)
CO2: 26 mEq/L (ref 19–32)
Calcium: 9.1 mg/dL (ref 8.4–10.5)
Chloride: 105 mEq/L (ref 96–112)
Creatinine, Ser: 0.94 mg/dL (ref 0.40–1.50)
GFR: 97.15 mL/min (ref >60.00)
Glucose, Bld: 158 mg/dL — ABNORMAL HIGH (ref 70–99)
Potassium: 4 mEq/L (ref 3.5–5.1)
Sodium: 143 mEq/L (ref 135–145)
Total Bilirubin: 0.3 mg/dL (ref 0.2–1.2)
Total Protein: 6.9 g/dL (ref 6.0–8.3)

## 2022-05-09 LAB — HEMOGLOBIN A1C: Hgb A1c MFr Bld: 8.1 % — ABNORMAL HIGH (ref 4.6–6.5)

## 2022-05-09 LAB — BRAIN NATRIURETIC PEPTIDE: Pro B Natriuretic peptide (BNP): 10 pg/mL (ref 0.0–100.0)

## 2022-05-09 LAB — T4, FREE: Free T4: 1.2 ng/dL (ref 0.60–1.60)

## 2022-05-09 LAB — TSH: TSH: 0.22 u[IU]/mL — ABNORMAL LOW (ref 0.35–5.50)

## 2022-05-09 MED ORDER — PEG 3350-KCL-NA BICARB-NACL 420 G PO SOLR: ORAL | 0 refills | Status: DC

## 2022-05-09 NOTE — Telephone Encounter (Signed)
Returned patients call to schedule repeat colonoscopy due to sub-optimal bowel prep with 04/2022 colonoscopy.  LVM for patient to call office back to schedule.  I've sent Nulytely bowel preps to pharmacy [facility].  Will await call back from patient to schedule colonoscopy with 2 day bowel prep.  Thanks, [place], [place]

## 2022-05-09 NOTE — Telephone Encounter (Signed)
Patient left vm to reschedule colonoscopy.

## 2022-05-13 ENCOUNTER — Other Ambulatory Visit: Payer: Self-pay | Admitting: Family Medicine

## 2022-05-13 ENCOUNTER — Encounter: Payer: Self-pay | Admitting: Family Medicine

## 2022-05-13 DIAGNOSIS — R400 Somnolence: Secondary | ICD-10-CM

## 2022-05-13 DIAGNOSIS — I517 Cardiomegaly: Secondary | ICD-10-CM

## 2022-05-13 MED ORDER — LEVOTHYROXINE SODIUM 175 MCG PO TABS: 175.0000 ug | ORAL_TABLET | Freq: Every day | ORAL | 1 refills | Status: DC

## 2022-05-14 ENCOUNTER — Encounter: Payer: Self-pay | Admitting: Family Medicine

## 2022-05-15 ENCOUNTER — Other Ambulatory Visit: Payer: Self-pay | Admitting: Family Medicine

## 2022-05-15 NOTE — Telephone Encounter (Signed)
Ok to send 90-day rx, per pharmacy request?

## 2022-06-16 ENCOUNTER — Encounter: Payer: Self-pay | Admitting: Family Medicine

## 2022-06-17 ENCOUNTER — Ambulatory Visit: Payer: BC Managed Care – PPO | Admitting: Registered Nurse

## 2022-06-17 ENCOUNTER — Encounter: Payer: Self-pay | Admitting: Gastroenterology

## 2022-06-17 ENCOUNTER — Ambulatory Visit
Admission: RE | Admit: 2022-06-17 | Discharge: 2022-06-17 | Disposition: A | Discharge status: Home / Self Care | Payer: BC Managed Care – PPO | Attending: Gastroenterology | Admitting: Gastroenterology

## 2022-06-17 ENCOUNTER — Encounter
Admission: RE | Disposition: A | Discharge status: Home / Self Care | Payer: Self-pay | Source: Home / Self Care | Attending: Gastroenterology

## 2022-06-17 DIAGNOSIS — Z6841 Body Mass Index (BMI) 40.0 and over, adult: Secondary | ICD-10-CM | POA: Diagnosis not present

## 2022-06-17 DIAGNOSIS — Z86010 Personal history of colonic polyps: Secondary | ICD-10-CM | POA: Insufficient documentation

## 2022-06-17 DIAGNOSIS — K5730 Diverticulosis of large intestine without perforation or abscess without bleeding: Secondary | ICD-10-CM | POA: Diagnosis not present

## 2022-06-17 DIAGNOSIS — K5140 Inflammatory polyps of colon without complications: Secondary | ICD-10-CM | POA: Diagnosis not present

## 2022-06-17 DIAGNOSIS — K635 Polyp of colon: Secondary | ICD-10-CM | POA: Diagnosis not present

## 2022-06-17 DIAGNOSIS — Z1211 Encounter for screening for malignant neoplasm of colon: Secondary | ICD-10-CM | POA: Insufficient documentation

## 2022-06-17 DIAGNOSIS — E119 Type 2 diabetes mellitus without complications: Secondary | ICD-10-CM | POA: Diagnosis not present

## 2022-06-17 DIAGNOSIS — E669 Obesity, unspecified: Secondary | ICD-10-CM | POA: Insufficient documentation

## 2022-06-17 DIAGNOSIS — I10 Essential (primary) hypertension: Secondary | ICD-10-CM | POA: Insufficient documentation

## 2022-06-17 DIAGNOSIS — K56699 Other intestinal obstruction unspecified as to partial versus complete obstruction: Secondary | ICD-10-CM | POA: Diagnosis not present

## 2022-06-17 DIAGNOSIS — D126 Benign neoplasm of colon, unspecified: Secondary | ICD-10-CM | POA: Diagnosis not present

## 2022-06-17 DIAGNOSIS — E039 Hypothyroidism, unspecified: Secondary | ICD-10-CM | POA: Insufficient documentation

## 2022-06-17 HISTORY — PX: COLONOSCOPY WITH PROPOFOL: SHX5780

## 2022-06-17 LAB — GLUCOSE, CAPILLARY: Glucose-Capillary: 116 mg/dL — ABNORMAL HIGH (ref 70–99)

## 2022-06-17 SURGERY — COLONOSCOPY WITH PROPOFOL
Anesthesia: General

## 2022-06-17 MED ORDER — DEXAMETHASONE SODIUM PHOSPHATE 10 MG/ML IJ SOLN
INTRAMUSCULAR | Status: DC | PRN
  Administered 2022-06-17: 4 mg via INTRAVENOUS

## 2022-06-17 MED ORDER — SUGAMMADEX SODIUM 500 MG/5ML IV SOLN
INTRAVENOUS | Status: DC | PRN
  Administered 2022-06-17: 500 mg via INTRAVENOUS

## 2022-06-17 MED ORDER — PROPOFOL 500 MG/50ML IV EMUL
INTRAVENOUS | Status: DC | PRN
  Administered 2022-06-17: 75 ug/kg/min via INTRAVENOUS

## 2022-06-17 MED ORDER — FENTANYL CITRATE (PF) 100 MCG/2ML IJ SOLN
INTRAMUSCULAR | Status: AC
  Filled 2022-06-17: qty 2

## 2022-06-17 MED ORDER — PROPOFOL 1000 MG/100ML IV EMUL
INTRAVENOUS | Status: AC
  Filled 2022-06-17: qty 100

## 2022-06-17 MED ORDER — PROPOFOL 10 MG/ML IV BOLUS
INTRAVENOUS | Status: DC | PRN
  Administered 2022-06-17: 200 mg via INTRAVENOUS
  Administered 2022-06-17: 30 mg via INTRAVENOUS

## 2022-06-17 MED ORDER — SODIUM CHLORIDE 0.9 % IV SOLN: INTRAVENOUS | Status: DC

## 2022-06-17 MED ORDER — PROPOFOL 10 MG/ML IV BOLUS
INTRAVENOUS | Status: AC
  Filled 2022-06-17: qty 20

## 2022-06-17 MED ORDER — ONDANSETRON HCL 4 MG/2ML IJ SOLN
INTRAMUSCULAR | Status: AC
  Filled 2022-06-17: qty 2

## 2022-06-17 MED ORDER — ROCURONIUM BROMIDE 100 MG/10ML IV SOLN
INTRAVENOUS | Status: DC | PRN
  Administered 2022-06-17: 50 mg via INTRAVENOUS

## 2022-06-17 MED ORDER — DEXAMETHASONE SODIUM PHOSPHATE 10 MG/ML IJ SOLN
INTRAMUSCULAR | Status: AC
  Filled 2022-06-17: qty 1

## 2022-06-17 MED ORDER — ROCURONIUM BROMIDE 10 MG/ML (PF) SYRINGE
PREFILLED_SYRINGE | INTRAVENOUS | Status: AC
  Filled 2022-06-17: qty 10

## 2022-06-17 MED ORDER — ONDANSETRON HCL 4 MG/2ML IJ SOLN
INTRAMUSCULAR | Status: DC | PRN
  Administered 2022-06-17: 4 mg via INTRAVENOUS

## 2022-06-17 MED ORDER — FENTANYL CITRATE (PF) 100 MCG/2ML IJ SOLN
INTRAMUSCULAR | Status: DC | PRN
  Administered 2022-06-17 (×2): 50 ug via INTRAVENOUS

## 2022-06-17 MED ORDER — LIDOCAINE HCL (CARDIAC) PF 100 MG/5ML IV SOSY
PREFILLED_SYRINGE | INTRAVENOUS | Status: DC | PRN
  Administered 2022-06-17: 100 mg via INTRAVENOUS

## 2022-06-17 MED ORDER — SUGAMMADEX SODIUM 500 MG/5ML IV SOLN
INTRAVENOUS | Status: AC
  Filled 2022-06-17: qty 5

## 2022-06-17 NOTE — H&P (Signed)
Jonathon Bellows, MD [address] [place], [place], [place], [address] [address], [place], [place], [place], [address] Phone: [phone]  Fax: [phone]  Primary Care Physician:  Ria Bush, MD   Pre-Procedure History & Physical: HPI:  Jorge Boyle is a 46 y.o. male is here for an colonoscopy.   Past Medical History:  Diagnosis Date   Allergic rhinitis    Diabetes mellitus without complication ([place])    Difficult intubation    PT DOES NOT REMEMBER THIS   Headache    History of chicken pox    History of kidney stones    Hypertension    Multiple thyroid nodules 01/2012   left, biopsy x2 benign, rpt [place] stable (01/2013, 07/2015) rec rpt 1 yr   Nontoxic nodular goiter    Obesity    Prediabetes    S/P excision of thyroid adenoma 2005   R thyroidectomy    Past Surgical History:  Procedure Laterality Date   COLONOSCOPY WITH PROPOFOL N/A 04/02/2022   Procedure: COLONOSCOPY WITH PROPOFOL;  Surgeon: Jonathon Bellows, MD;  Location: [facility] ENDOSCOPY;  Service: Gastroenterology;  Laterality: N/A;   COLONOSCOPY WITH PROPOFOL N/A 04/25/2022   Procedure: COLONOSCOPY WITH PROPOFOL;  Surgeon: Jonathon Bellows, MD;  Location: [facility] ENDOSCOPY;  Service: Gastroenterology;  Laterality: N/A;   CYSTOSCOPY/URETEROSCOPY/HOLMIUM LASER/STENT PLACEMENT Left 02/02/2018   Procedure: CYSTOSCOPY/URETEROSCOPY/HOLMIUM LASER/STENT PLACEMENT;  Surgeon: Hollice Espy, MD;  Location: [facility] ORS;  Service: Urology;  Laterality: Left;   HERNIA REPAIR     THYROIDECTOMY, PARTIAL  2005   Right and isthmus. Dr Nadeen Landau   TONSILLECTOMY AND ADENOIDECTOMY  1985   TOTAL THYROIDECTOMY Left 28/4132   path: [place] ([place])   UMBILICAL HERNIA REPAIR  12/30/2013   [place]   VENTRAL HERNIA REPAIR N/A 02/02/2018   Procedure: LAPAROSCOPIC VENTRAL HERNIA;  Surgeon: Robert Bellow, MD;  Location: [facility] ORS;  Service: General;  Laterality: N/A;    Prior to Admission  medications   Medication Sig Start Date End Date Taking? Authorizing Provider  losartan (COZAAR) 100 MG tablet Take 1 tablet (100 mg total) by mouth daily. 09/22/21  Yes Ria Bush, MD  metFORMIN (GLUCOPHAGE) 1000 MG tablet Take 1 tablet (1,000 mg total) by mouth 2 (two) times daily with a meal. 09/22/21  Yes Ria Bush, MD  atorvastatin (LIPITOR) 20 MG tablet Take 1 tablet (20 mg total) by mouth daily. 09/22/21   Ria Bush, MD  Blood Glucose Monitoring Suppl (CONTOUR NEXT MONITOR) w/Device KIT Use as instructed to check blood sugar once daily. 07/07/20   Ria Bush, MD  colchicine 0.6 MG tablet TAKE 1 TABLET (0.6 MG TOTAL) BY MOUTH DAILY AS NEEDED (GOUT FLARE). MAY TAKE 2 TABLETS ON FIRST DAY OF USE 10/01/21   Ria Bush, MD  cyclobenzaprine (FLEXERIL) 10 MG tablet Take 0.5-1 tablets (5-10 mg total) by mouth 2 (two) times daily as needed for muscle spasms (sedation precautions). 09/21/21   Ria Bush, MD  fluticasone [facility]) 50 MCG/ACT nasal spray Place 2 sprays into both nostrils daily. 09/21/21   Ria Bush, MD  glucose blood (CONTOUR NEXT TEST) test strip Use as instructed to check blood sugar once daily. 05/08/22   Ria Bush, MD  hydrOXYzine (ATARAX) 25 MG tablet TAKE 0.5-1 TABLETS BY MOUTH 2 TIMES DAILY AS NEEDED FOR ANXIETY (SEDATION PRECAUTIONS). 05/17/22   Ria Bush, MD  levothyroxine (SYNTHROID) 175 MCG tablet Take 1 tablet (175 mcg total) by mouth daily before breakfast. 05/13/22  Ria Bush, MD  Microlet Lancets MISC Use as directed to check blood sugars once daily. 07/05/20   Ria Bush, MD  naproxen (NAPROSYN) 500 MG tablet TAKE 1 TABLET BY MOUTH TWICE A DAY FOR 1 WEEK THEN AS NEEDED FOR PAIN TAKE WITH FOOD 01/30/22   Ria Bush, MD  pioglitazone (ACTOS) 15 MG tablet Take 1 tablet (15 mg total) by mouth daily. 09/22/21   Ria Bush, MD  polyethylene glycol-electrolytes (NULYTELY) 420 g solution 2 days  before colonoscopy at 5pm Mix bowel prep with luke warm water.  Drink 8 oz every 15-20 mins until half has been completed.  10pm drink remaining half drinking 8 oz every 15-20 mins until entire contents have been completed. 1 day before colonoscopy drink 8 oz every 15-20 mins until half has been completed.  5 hours prior to colonoscopy  drink remaining half drinking 8 oz every 15-20 mins until entire contents have been completed. Finish 2 hours prior to colonoscopy time. 05/09/22   Jonathon Bellows, MD  Semaglutide, 1 MG/DOSE, (OZEMPIC, 1 MG/DOSE,) 4 MG/3ML SOPN Inject 1 mg into the skin once a week. 09/22/21   Ria Bush, MD    Allergies as of 05/09/2022 - Review Complete 05/08/2022  Allergen Reaction Noted   Penicillins Rash 01/01/2012   Lisinopril  06/29/2018    Family History  Problem Relation Age of Onset   Diabetes Paternal Grandmother    Cancer Paternal Grandmother 62       breast   Diabetes Mother    Thyroid disease Mother    Diabetes Father    Thyroid disease Father    Stroke Maternal Grandmother    Diabetes Maternal Grandfather    Cancer Other        stomach, lung (smoker)   Coronary artery disease Neg Hx     Social History   Socioeconomic History   Marital status: Legally Separated    Spouse name: Not on file   Number of children: Not on file   Years of education: Not on file   Highest education level: Not on file  Occupational History   Not on file  Tobacco Use   Smoking status: Never   Smokeless tobacco: Never  Vaping Use   Vaping Use: Never used  Substance and Sexual Activity   Alcohol use: Not Currently    Comment: rare   Drug use: No   Sexual activity: Not on file  Other Topics Concern   Not on file  Social History Narrative   Caffeine: 1-2 cups /day   Lives with twin brother, 2 dogs   Occupation: Journalist, 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)   Edu: HS   Activity: goes to gym 3x/wk   Diet: good water, fruits/vegetables some     Social Determinants of Radio broadcast assistant Strain: Not on file  Food Insecurity: Not on file  Transportation Needs: Not on file  Physical Activity: Not on file  Stress: Not on file  Social Connections: Not on file  Intimate Partner Violence: Not on file    Review of Systems: See HPI, otherwise negative ROS  Physical Exam: BP (!) 160/90   Pulse 81   Temp (!) 97 F (36.1 C) (Temporal)   Resp 17   Ht 5' 11" (1.803 m)   Wt 136.1 kg   SpO2 98%   BMI 41.84 kg/m  General:   Alert,  pleasant and cooperative in NAD Head:  Normocephalic and atraumatic. Neck:  Supple; no masses or  thyromegaly. Lungs:  Clear throughout to auscultation, normal respiratory effort.    Heart:  +S1, +S2, Regular rate and rhythm, No edema. Abdomen:  Soft, nontender and nondistended. Normal bowel sounds, without guarding, and without rebound.   Neurologic:  Alert and  oriented x4;  grossly normal neurologically.  Impression/Plan: Jorge Boyle is here for an colonoscopy to be performed for Screening colonoscopy average risk   Risks, benefits, limitations, and alternatives regarding  colonoscopy have been reviewed with the patient.  Questions have been answered.  All parties agreeable.    , MD  06/17/2022, 10:36 AM  

## 2022-06-17 NOTE — Transfer of Care (Signed)
Immediate Anesthesia Transfer of Care Note  Patient: Jorge Boyle  Procedure(s) Performed: COLONOSCOPY WITH PROPOFOL  Patient Location: Endoscopy Unit  Anesthesia Type:General  Level of Consciousness: drowsy  Airway & Oxygen Therapy: Patient Spontanous Breathing and Patient connected to face mask oxygen  Post-op Assessment: Report given to RN and Post -op Vital signs reviewed and stable  Post vital signs: Reviewed and stable  Last Vitals:  Vitals Value Taken Time  BP 120/68 06/17/22 1132  Temp 36.1 C 06/17/22 1132  Pulse 97 06/17/22 1134  Resp 21 06/17/22 1134  SpO2 99 % 06/17/22 1134  Vitals shown include unvalidated device data.  Last Pain:  Vitals:   06/17/22 1132  TempSrc: Tympanic  PainSc: Asleep         Complications: No notable events documented.

## 2022-06-17 NOTE — Anesthesia Postprocedure Evaluation (Signed)
Anesthesia Post Note  Patient: Jorge Boyle  Procedure(s) Performed: COLONOSCOPY WITH PROPOFOL  Patient location during evaluation: Endoscopy Anesthesia Type: General Level of consciousness: awake and alert Pain management: pain level controlled Vital Signs Assessment: post-procedure vital signs reviewed and stable Respiratory status: spontaneous breathing, nonlabored ventilation, respiratory function stable and patient connected to nasal cannula oxygen Cardiovascular status: blood pressure returned to baseline and stable Postop Assessment: no apparent nausea or vomiting Anesthetic complications: no   No notable events documented.   Last Vitals:  Vitals:   06/17/22 1142 06/17/22 1152  BP: 113/76 113/76  Pulse: 93 91  Resp: 19 18  Temp:    SpO2: 97% 96%    Last Pain:  Vitals:   06/17/22 1152  TempSrc:   PainSc: 0-No pain                 [name]

## 2022-06-17 NOTE — Op Note (Signed)
[facility] Gastroenterology Patient Name: Jorge Boyle Procedure Date: 06/17/2022 10:44 AM MRN: [id number] Account #: [id number] Date of Birth: Dec 01, 1975 Admit Type: Outpatient Age: 46 Room: [facility] ENDO ROOM 1 Gender: Male Note Status: Finalized Instrument Name: Park Meo [id number] Procedure:             Colonoscopy Indications:           Screening for colorectal malignant neoplasm Providers:             Jonathon Bellows MD, MD Referring MD:          Ria Bush (Referring MD) Medicines:             Monitored Anesthesia Care Complications:         No immediate complications. Procedure:             Pre-Anesthesia Assessment:                        - Prior to the procedure, a History and Physical was                         performed, and patient medications, allergies and                         sensitivities were reviewed. The patient's tolerance                         of previous anesthesia was reviewed.                        - The risks and benefits of the procedure and the                         sedation options and risks were discussed with the                         patient. All questions were answered and informed                         consent was obtained.                        - ASA Grade Assessment: II - A patient with mild                         systemic disease.                        After obtaining informed consent, the colonoscope was                         passed under direct vision. Throughout the procedure,                         the patient's blood pressure, pulse, and oxygen                         saturations were monitored continuously. The                         Colonoscope was introduced through  the anus and                         advanced to the the cecum, identified by the                         appendiceal orifice. The colonoscopy was performed                         with ease. The patient tolerated the procedure well.                          The quality of the bowel preparation was adequate. Findings:      The perianal and digital rectal examinations were normal.      Multiple small and large-mouthed diverticula were found in the sigmoid       colon.      A benign-appearing, intrinsic moderate stenosis was found in the sigmoid       colon.      A 20 mm polyp was found in the sigmoid colon. The polyp was       pedunculated. The polyp was removed with a hot snare. Resection and       retrieval were complete. To prevent bleeding after the polypectomy, two       hemostatic clips were successfully placed. There was no bleeding during,       or at the end, of the procedure.      A 15 mm polyp was found in the sigmoid colon. The polyp was sessile.       Preparations were made for mucosal resection. Saline was injected to       raise the lesion. Snare mucosal resection was performed. Resection and       retrieval were complete. To prevent bleeding after mucosal resection,       one hemostatic clip was successfully placed. There was no bleeding       during, or at the end, of the procedure. borders of polyp marked using       blue or nbi light      The exam was otherwise without abnormality on direct and retroflexion       views. Impression:            - Diverticulosis in the sigmoid colon.                        - Stricture in the sigmoid colon.                        - One 20 mm polyp in the sigmoid colon, removed with a                         hot snare. Resected and retrieved. Clips were placed.                        - One 15 mm polyp in the sigmoid colon, removed with                         mucosal resection. Resected and retrieved. Clip was  placed.                        - The examination was otherwise normal on direct and                         retroflexion views.                        - Mucosal resection was performed. Resection and                         retrieval were  complete. Recommendation:        - Discharge patient to home (with escort).                        - Resume previous diet.                        - Continue present medications.                        - Await pathology results.                        - Perform a flexible sigmoidoscopy to review                         polypectomy site in 6 months. Procedure Code(s):     --- Professional ---                        [phone], Colonoscopy, flexible; with endoscopic mucosal                         resection                        45385, 25, Colonoscopy, flexible; with removal of                         tumor(s), polyp(s), or other lesion(s) by snare                         technique Diagnosis Code(s):     --- Professional ---                        Z12.11, Encounter for screening for malignant neoplasm                         of colon                        K56.699, Other intestinal obstruction unspecified as                         to partial versus complete obstruction                        K63.5, Polyp of colon                        K57.30, Diverticulosis of large intestine without  perforation or abscess without bleeding CPT copyright 2019 American Medical Association. All rights reserved. The codes documented in this report are preliminary and upon coder review may  be revised to meet current compliance requirements. Jonathon Bellows, MD Jonathon Bellows MD, MD 06/17/2022 11:25:54 AM This report has been signed electronically. Number of Addenda: 0 Note Initiated On: 06/17/2022 10:44 AM Total Procedure Duration: 0 hours 25 minutes 14 seconds  Estimated Blood Loss:  Estimated blood loss: none.      [facility]

## 2022-06-17 NOTE — Anesthesia Preprocedure Evaluation (Signed)
Anesthesia Evaluation  Patient identified by MRN, date of birth, ID band Patient awake    Reviewed: Allergy & Precautions, NPO status , Patient's Chart, lab work & pertinent test results  History of Anesthesia Complications (+) DIFFICULT AIRWAY and history of anesthetic complications  Airway Mallampati: III  TM Distance: <3 FB Neck ROM: full    Dental  (+) Chipped   Pulmonary neg shortness of breath,    Pulmonary exam normal        Cardiovascular Exercise Tolerance: Good hypertension, (-) anginaNormal cardiovascular exam     Neuro/Psych  Headaches, negative psych ROS   GI/Hepatic negative GI ROS, Neg liver ROS,   Endo/Other  diabetes, Type 2Hypothyroidism   Renal/GU Renal disease     Musculoskeletal   Abdominal   Peds  Hematology negative hematology ROS (+)   Anesthesia Other Findings Past Medical History: No date: Allergic rhinitis No date: Diabetes mellitus without complication (HCC) No date: Difficult intubation     Comment:  PT DOES NOT REMEMBER THIS No date: Headache No date: History of chicken pox No date: History of kidney stones No date: Hypertension 01/2012: Multiple thyroid nodules     Comment:  left, biopsy x2 benign, rpt [place] stable (01/2013, 07/2015)               rec rpt 1 yr No date: Nontoxic nodular goiter No date: Obesity No date: Prediabetes 2005: S/P excision of thyroid adenoma     Comment:  R thyroidectomy  Past Surgical History: 04/02/2022: COLONOSCOPY WITH PROPOFOL; N/A     Comment:  Procedure: COLONOSCOPY WITH PROPOFOL;  Surgeon: Jonathon Bellows, MD;  Location: [facility] ENDOSCOPY;  Service:               Gastroenterology;  Laterality: N/A; 04/25/2022: COLONOSCOPY WITH PROPOFOL; N/A     Comment:  Procedure: COLONOSCOPY WITH PROPOFOL;  Surgeon: Jonathon Bellows, MD;  Location: [facility] ENDOSCOPY;  Service:               Gastroenterology;  Laterality: N/A; 02/02/2018:  CYSTOSCOPY/URETEROSCOPY/HOLMIUM LASER/STENT PLACEMENT;  Left     Comment:  Procedure: CYSTOSCOPY/URETEROSCOPY/HOLMIUM LASER/STENT               PLACEMENT;  Surgeon: Hollice Espy, MD;  Location: [facility]              ORS;  Service: Urology;  Laterality: Left; No date: HERNIA REPAIR 2005: THYROIDECTOMY, PARTIAL     Comment:  Right and isthmus. Dr Nadeen Landau 1985: [place] 09/2020: TOTAL THYROIDECTOMY; Left     Comment:  path: FOLLICULAR ADENOMA WITH CYSTIC DEGENERATION               Celine Ahr) [birth year]: UMBILICAL HERNIA REPAIR     Comment:  [place] 02/02/2018: VENTRAL HERNIA REPAIR; N/A     Comment:  Procedure: LAPAROSCOPIC VENTRAL HERNIA;  Surgeon:               Robert Bellow, MD;  Location: [facility] ORS;  Service:               General;  Laterality: N/A;  BMI    Body Mass Index: 41.84 kg/m      Reproductive/Obstetrics negative OB ROS  Anesthesia Physical Anesthesia Plan  ASA: 3  Anesthesia Plan: General ETT   Post-op Pain Management:    Induction: Intravenous  PONV Risk Score and Plan: Ondansetron, Dexamethasone, Midazolam and Treatment may vary due to age or medical condition  Airway Management Planned: Oral ETT and Video Laryngoscope Planned  Additional Equipment:   Intra-op Plan:   Post-operative Plan: Extubation in OR  Informed Consent: I have reviewed the patients History and Physical, chart, labs and discussed the procedure including the risks, benefits and alternatives for the proposed anesthesia with the patient or authorized representative who has indicated his/her understanding and acceptance.     Dental Advisory Given  Plan Discussed with: Anesthesiologist, CRNA and Surgeon  Anesthesia Plan Comments: (Patient reports active GLP-1 agonist use.  After discussing (with both the patient and the surgeon) the risks of proceeding (aspiration, lung damage and death) and given the  option to reschedule the patient declines to reschedule and assents to proceed with rapid sequence intubation for the procedure per ASA guidance.    Patient consented for risks of anesthesia including but not limited to:  - adverse reactions to medications - damage to eyes, teeth, lips or other oral mucosa - nerve damage due to positioning  - sore throat or hoarseness - Damage to heart, brain, nerves, lungs, other parts of body or loss of life  Patient voiced understanding.)        Anesthesia Quick Evaluation

## 2022-06-17 NOTE — Anesthesia Procedure Notes (Signed)
Procedure Name: Intubation Date/Time: 06/17/2022 10:52 AM  Performed by: Lia Foyer, CRNAPre-anesthesia Checklist: Patient identified, Emergency Drugs available, Suction available and Patient being monitored Patient Re-evaluated:Patient Re-evaluated prior to induction Oxygen Delivery Method: Circle system utilized Preoxygenation: Pre-oxygenation with 100% oxygen Induction Type: IV induction Ventilation: Mask ventilation without difficulty Laryngoscope Size: McGraph and 4 Grade View: Grade II Tube type: Oral Number of attempts: 1 Airway Equipment and Method: Stylet and Video-laryngoscopy Placement Confirmation: ETT inserted through vocal cords under direct vision, positive ETCO2 and breath sounds checked- equal and bilateral Secured at: 20 cm Tube secured with: Tape Dental Injury: Teeth and Oropharynx as per pre-operative assessment  Comments: Anterior airway, easy intubation with McGraph #4

## 2022-06-18 ENCOUNTER — Encounter: Payer: Self-pay | Admitting: Gastroenterology

## 2022-06-18 LAB — SURGICAL PATHOLOGY

## 2022-06-19 ENCOUNTER — Encounter: Payer: Self-pay | Admitting: Family Medicine

## 2022-06-19 MED ORDER — SEMAGLUTIDE (2 MG/DOSE) 8 MG/3ML ~~LOC~~ SOPN: 2.0000 mg | PEN_INJECTOR | SUBCUTANEOUS | 3 refills | Status: DC

## 2022-06-19 NOTE — Progress Notes (Signed)
Polyps were benign - flex sig in 6 months as the polyps were large

## 2022-06-27 ENCOUNTER — Telehealth: Payer: Self-pay

## 2022-06-27 NOTE — Telephone Encounter (Signed)
Called patient to let him know that his polyps were benign. However, Dr. Vicente Males wanted to do a flexible sigmoidoscopy in 6 months to take a look since he had large polyps. Patient agreed that we would put him in our 6 months recall to schedule it. Patient had no further questions.

## 2022-06-27 NOTE — Telephone Encounter (Signed)
-----   Message from Jonathon Bellows, MD sent at 06/19/2022  8:38 AM EDT ----- Polyps were benign - flex sig in 6 months as the polyps were large

## 2022-07-24 ENCOUNTER — Encounter: Payer: Self-pay | Admitting: Family Medicine

## 2022-07-24 DIAGNOSIS — L918 Other hypertrophic disorders of the skin: Secondary | ICD-10-CM

## 2022-08-01 DIAGNOSIS — M722 Plantar fascial fibromatosis: Secondary | ICD-10-CM | POA: Diagnosis not present

## 2022-08-20 ENCOUNTER — Ambulatory Visit (INDEPENDENT_AMBULATORY_CARE_PROVIDER_SITE_OTHER): Payer: BC Managed Care – PPO | Admitting: Primary Care

## 2022-08-20 ENCOUNTER — Ambulatory Visit (INDEPENDENT_AMBULATORY_CARE_PROVIDER_SITE_OTHER): Payer: BC Managed Care – PPO

## 2022-08-20 ENCOUNTER — Encounter: Payer: Self-pay | Admitting: Primary Care

## 2022-08-20 VITALS — BP 124/80 | HR 96 | Temp 97.7°F | Ht 71.0 in | Wt 303.0 lb

## 2022-08-20 DIAGNOSIS — Z23 Encounter for immunization: Secondary | ICD-10-CM | POA: Diagnosis not present

## 2022-08-20 DIAGNOSIS — R0683 Snoring: Secondary | ICD-10-CM | POA: Diagnosis not present

## 2022-08-20 NOTE — Assessment & Plan Note (Addendum)
-   Patient has symptoms of loud snoring, restless sleep and daytime sleepiness.  Epworth score 7. BMI 42.  His twin brother has sleep apnea. Strong suspicion that patient has underlying OSA.  Patient needs home sleep study to evaluate.  We reviewed risks of untreated sleep apnea including cardiac arrhythmias, pulmonary hypertension, diabetes and stroke.  We also discussed treatment options including weight loss, oral appliance, CPAP therapy or referral to ENT for possible surgical options.  Encourage side sleeping position.  Advised against driving if experiencing excessive daytime sleepiness fatigue.  Patient to follow-up 1 to 2 weeks after sleep study to review results and treatment options if needed.

## 2022-08-20 NOTE — Progress Notes (Signed)
_0  ID: Jorge Boyle, male    DOB: [birth year], 46 y.o.   MRN: [id number]  Chief Complaint  Patient presents with   sleep consult    No prior sleep study. C/o loud snoring, restless sleep, wakes gasping for air and daytime sleepiness.     Referring provider: Ria Bush, MD  HPI: 46 year old male, never smoked.  Past medical history significant for hypertension, cardiomegaly, hepatic steatosis, type 2 diabetes, thyroid nodule s/p thyroidectomy, gout, anxiety, obesity.  08/20/2022 Patient presents today for sleep consult. He has symptoms of restless sleep, snoring and daytime somnolence. He does not feel that he sleeps well at night. His brother has told him that he can hear him snoring on the other side of the house. He gets between 5-7 hours of sleep a night. Typical bedtime is 9:30-10pm.  He is a side sleeper. It does not take him long to fall asleep. He wakes up 2-3 times a night. His dogs sleep in his room with him. He starts his day anytime between 3-5am. He is tired when he wakes up in the morning.  He doses off easily when in active. Denies narcolepsy, cataplexy or sleepwalking.  Sleep questionnaire Symptoms- fragmented sleep, snoring Previous sleep study- none Typical bedtime- 9:30pm Time to fall asleep - not long Nocturnal awakenings- -2 to 3 times Out of bed/start of day- 3-5am Weight changes- 30 lbs  Do you operate heavy machinery- yes Do you wear CPAP- no Do you wear oxygen- no Epworth score- 7 Sleep medication or SSRIs- Atarax 12.5-25mg as needed prn anxiety    Allergies  Allergen Reactions   Penicillins Rash    Has patient had a PCN reaction causing immediate rash, facial/tongue/throat swelling, SOB or lightheadedness with hypotension: Yes Has patient had a PCN reaction causing severe rash involving mucus membranes or skin necrosis: No Has patient had a PCN reaction that required hospitalization: Unknown Has patient had a PCN reaction occurring  within the last 10 years: No If all of the above answers are "NO", then may proceed with Cephalosporin use.    Lisinopril     Throat irritation, dry cough    Immunization History  Administered Date(s) Administered   Influenza,inj,Quad PF,6+ Mos 07/06/2013, 07/26/2015, 07/26/2016, 06/29/2018, 07/09/2019, 08/25/2020, 09/21/2021, 08/20/2022   PFIZER(Purple Top)SARS-COV-2 Vaccination 01/31/2020, 02/21/2020, 10/11/2020   Pfizer Covid-19 Vaccine Bivalent Booster 48yr & up 08/01/2021   Pneumococcal Polysaccharide-23 07/26/2015   Tdap 01/01/2012    Past Medical History:  Diagnosis Date   Allergic rhinitis    Diabetes mellitus without complication ([place]    Difficult intubation    PT DOES NOT REMEMBER THIS   Headache    History of chicken pox    History of kidney stones    Hypertension    Multiple thyroid nodules 01/2012   left, biopsy x2 benign, rpt [place] (01/2013, 07/2015) rec rpt 1 yr   Nontoxic nodular goiter    Obesity    Prediabetes    S/P excision of thyroid adenoma 2005   R thyroidectomy    Tobacco History: Social History   Tobacco Use  Smoking Status Never  Smokeless Tobacco Never   Counseling given: Not Answered   Outpatient Medications Prior to Visit  Medication Sig Dispense Refill   atorvastatin (LIPITOR) 20 MG tablet Take 1 tablet (20 mg total) by mouth daily. 90 tablet 3   Blood Glucose Monitoring Suppl (CONTOUR NEXT MONITOR) w/Device KIT Use as instructed to check blood sugar once daily. 1 kit 0  colchicine 0.6 MG tablet TAKE 1 TABLET (0.6 MG TOTAL) BY MOUTH DAILY AS NEEDED (GOUT FLARE). MAY TAKE 2 TABLETS ON FIRST DAY OF USE 30 tablet 3   cyclobenzaprine (FLEXERIL) 10 MG tablet Take 0.5-1 tablets (5-10 mg total) by mouth 2 (two) times daily as needed for muscle spasms (sedation precautions). 30 tablet 1   fluticasone (FLONASE) 50 MCG/ACT nasal spray Place 2 sprays into both nostrils daily. 48 g 3   glucose blood (CONTOUR NEXT TEST) test strip Use as  instructed to check blood sugar once daily. 100 each 3   hydrOXYzine (ATARAX) 25 MG tablet TAKE 0.5-1 TABLETS BY MOUTH 2 TIMES DAILY AS NEEDED FOR ANXIETY (SEDATION PRECAUTIONS). 90 tablet 1   levothyroxine (SYNTHROID) 175 MCG tablet Take 1 tablet (175 mcg total) by mouth daily before breakfast. 90 tablet 1   losartan (COZAAR) 100 MG tablet Take 1 tablet (100 mg total) by mouth daily. 90 tablet 3   metFORMIN (GLUCOPHAGE) 1000 MG tablet Take 1 tablet (1,000 mg total) by mouth 2 (two) times daily with a meal. 180 tablet 3   Microlet Lancets MISC Use as directed to check blood sugars once daily. 100 each 3   naproxen (NAPROSYN) 500 MG tablet TAKE 1 TABLET BY MOUTH TWICE A DAY FOR 1 WEEK THEN AS NEEDED FOR PAIN TAKE WITH FOOD 50 tablet 0   pioglitazone (ACTOS) 15 MG tablet Take 1 tablet (15 mg total) by mouth daily. 90 tablet 3   polyethylene glycol-electrolytes (NULYTELY) 420 g solution 2 days before colonoscopy at 5pm Mix bowel prep with luke warm water.  Drink 8 oz every 15-20 mins until half has been completed.  10pm drink remaining half drinking 8 oz every 15-20 mins until entire contents have been completed. 1 day before colonoscopy drink 8 oz every 15-20 mins until half has been completed.  5 hours prior to colonoscopy  drink remaining half drinking 8 oz every 15-20 mins until entire contents have been completed. Finish 2 hours prior to colonoscopy time. 8000 mL 0   Semaglutide, 2 MG/DOSE, 8 MG/3ML SOPN Inject 2 mg as directed once a week. 3 mL 3   No facility-administered medications prior to visit.   Review of Systems  Review of Systems  Constitutional:  Positive for fatigue.  HENT: Negative.    Respiratory: Negative.    Cardiovascular: Negative.   Psychiatric/Behavioral:  Positive for sleep disturbance.    Physical Exam  BP 124/80 (BP Location: Left Arm, Cuff Size: Large)   Pulse 96   Temp 97.7 F (36.5 C) (Temporal)   Ht _0  (1.803 m)   Wt (!) 303 lb (137.4 kg)   SpO2 98%    BMI 42.26 kg/m  Physical Exam Constitutional:      Appearance: Normal appearance.  HENT:     Head: Normocephalic and atraumatic.     Mouth/Throat:     Mouth: Mucous membranes are moist.     Pharynx: Oropharynx is clear.     Comments: Mallampati class III Cardiovascular:     Rate and Rhythm: Normal rate and regular rhythm.  Pulmonary:     Effort: Pulmonary effort is normal.     Breath sounds: Normal breath sounds. No wheezing, rhonchi or rales.  Musculoskeletal:        General: Normal range of motion.  Skin:    General: Skin is warm and dry.  Neurological:     General: No focal deficit present.     Mental Status: He is alert and  oriented to person, place, and time. Mental status is at baseline.  Psychiatric:        Mood and Affect: Mood normal.        Behavior: Behavior normal.        Thought Content: Thought content normal.        Judgment: Judgment normal.      Lab Results:  CBC    Component Value Date/Time   WBC 11.9 (H) 05/08/2022 1627   RBC 4.38 05/08/2022 1627   HGB 13.4 05/08/2022 1627   HGB 14.5 12/23/2013 0852   HCT 40.1 05/08/2022 1627   HCT 42.6 12/23/2013 0852   PLT 286.0 05/08/2022 1627   PLT 277 12/23/2013 0852   MCV 91.7 05/08/2022 1627   MCV 91 12/23/2013 0852   MCH 29.8 09/21/2021 1551   MCHC 33.3 05/08/2022 1627   RDW 14.2 05/08/2022 1627   RDW 13.6 12/23/2013 0852   LYMPHSABS 2.1 05/08/2022 1627   MONOABS 0.6 05/08/2022 1627   EOSABS 0.3 05/08/2022 1627   BASOSABS 0.1 05/08/2022 1627    BMET    Component Value Date/Time   NA 143 05/08/2022 1627   NA 138 12/23/2013 0852   K 4.0 05/08/2022 1627   K 3.7 12/23/2013 0852   K 4.0 02/17/2006 0000   CL 105 05/08/2022 1627   CL 105 12/23/2013 0852   CL 106 02/17/2006 0000   CO2 26 05/08/2022 1627   CO2 28 12/23/2013 0852   GLUCOSE 158 (H) 05/08/2022 1627   GLUCOSE 102 (H) 12/23/2013 0852   BUN 14 05/08/2022 1627   BUN 8 12/23/2013 0852   CREATININE 0.94 05/08/2022 1627   CREATININE  0.87 09/21/2021 1551   CALCIUM 9.1 05/08/2022 1627   CALCIUM 8.6 12/23/2013 0852   CALCIUM 9.5 02/17/2006 0000   GFRNONAA >60 01/27/2018 1353   GFRNONAA >60 12/23/2013 0852   GFRAA >60 01/27/2018 1353   GFRAA >60 12/23/2013 0852    BNP No results found for: "BNP"  ProBNP    Component Value Date/Time   PROBNP 10.0 05/08/2022 1712    Imaging: No results found.   Assessment & Plan:   Loud snoring - Patient has symptoms of loud snoring, restless sleep and daytime sleepiness.  Epworth score 7. BMI 42.  His twin brother has sleep apnea. Strong suspicion that patient has underlying OSA.  Patient needs home sleep study to evaluate.  We reviewed risks of untreated sleep apnea including cardiac arrhythmias, pulmonary hypertension, diabetes and stroke.  We also discussed treatment options including weight loss, oral appliance, CPAP therapy or referral to ENT for possible surgical options.  Encourage side sleeping position.  Advised against driving if experiencing excessive daytime sleepiness fatigue.  Patient to follow-up 1 to 2 weeks after sleep study to review results and treatment options if needed.   Martyn Ehrich, NP 08/20/2022

## 2022-08-20 NOTE — Patient Instructions (Addendum)
Sleep apnea is defined as period of 10 seconds or longer when you stop breathing at night. This can happen multiple times a night. Dx sleep apnea is when this occurs more than 5 times an hour.    Mild OSA 5-15 apneic events an hour Moderate OSA 15-30 apneic events an hour Severe OSA > 30 apneic events an hour   Untreated sleep apnea puts you at higher risk for cardiac arrhythmias, pulmonary HTN, stroke and diabetes  Treatment options include weight loss, side sleeping position, oral appliance, CPAP therapy or referral to ENT for possible surgical options    Recommendations: Focus on side sleeping position or elevate head with wedge pillow 30 degrees Work on weight loss efforts if able  Do not drive if experiencing excessive daytime sleepiness of fatigue    Orders: Home sleep study re: loud snoring (ordered)   Follow-up: Please call to schedule follow-up 1-2 weeks after completing home sleep study to review results and treatment if needed (can be virtual)   Sleep Apnea Sleep apnea affects breathing during sleep. It causes breathing to stop for 10 seconds or more, or to become shallow. People with sleep apnea usually snore loudly. It can also increase the risk of: Heart attack. Stroke. Being very overweight (obese). Diabetes. Heart failure. Irregular heartbeat. High blood pressure. The goal of treatment is to help you breathe normally again. What are the causes?  The most common cause of this condition is a collapsed or blocked airway. There are three kinds of sleep apnea: Obstructive sleep apnea. This is caused by a blocked or collapsed airway. Central sleep apnea. This happens when the brain does not send the right signals to the muscles that control breathing. Mixed sleep apnea. This is a combination of obstructive and central sleep apnea. What increases the risk? Being overweight. Smoking. Having a small airway. Being older. Being male. Drinking alcohol. Taking  medicines to calm yourself (sedatives or tranquilizers). Having family members with the condition. Having a tongue or tonsils that are larger than normal. What are the signs or symptoms? Trouble staying asleep. Loud snoring. Headaches in the morning. Waking up gasping. Dry mouth or sore throat in the morning. Being sleepy or tired during the day. If you are sleepy or tired during the day, you may also: Not be able to focus your mind (concentrate). Forget things. Get angry a lot and have mood swings. Feel sad (depressed). Have changes in your personality. Have less interest in sex, if you are male. Be unable to have an erection, if you are male. How is this treated?  Sleeping on your side. Using a medicine to get rid of mucus in your nose (decongestant). Avoiding the use of alcohol, medicines to help you relax, or certain pain medicines (narcotics). Losing weight, if needed. Changing your diet. Quitting smoking. Using a machine to open your airway while you sleep, such as: An oral appliance. This is a mouthpiece that shifts your lower jaw forward. A CPAP device. This device blows air through a mask when you breathe out (exhale). An EPAP device. This has valves that you put in each nostril. A BIPAP device. This device blows air through a mask when you breathe in (inhale) and breathe out. Having surgery if other treatments do not work. Follow these instructions at home: Lifestyle Make changes that your doctor recommends. Eat a healthy diet. Lose weight if needed. Avoid alcohol, medicines to help you relax, and some pain medicines. Do not smoke or use any products that   contain nicotine or tobacco. If you need help quitting, ask your doctor. General instructions Take over-the-counter and prescription medicines only as told by your doctor. If you were given a machine to use while you sleep, use it only as told by your doctor. If you are having surgery, make sure to tell your  doctor you have sleep apnea. You may need to bring your device with you. Keep all follow-up visits. Contact a doctor if: The machine that you were given to use during sleep bothers you or does not seem to be working. You do not get better. You get worse. Get help right away if: Your chest hurts. You have trouble breathing in enough air. You have an uncomfortable feeling in your back, arms, or stomach. You have trouble talking. One side of your body feels weak. A part of your face is hanging down. These symptoms may be an emergency. Get help right away. Call your local emergency services (911 in the [place] Do not wait to see if the symptoms will go away. Do not drive yourself to the hospital. Summary This condition affects breathing during sleep. The most common cause is a collapsed or blocked airway. The goal of treatment is to help you breathe normally while you sleep. This information is not intended to replace advice given to you by your health care provider. Make sure you discuss any questions you have with your health care provider. Document Revised: 05/16/2021 Document Reviewed: 09/15/2020 Elsevier Patient Education  2023 Elsevier Inc.   

## 2022-08-20 NOTE — Progress Notes (Signed)
Patient presented for flu shot given by Sherrilee Gilles, CMA to left deltoid, patient voiced no concerns nor showed any signs of distress during injection.

## 2022-08-23 NOTE — Progress Notes (Signed)
Reviewed and agree with assessment/plan.   Chesley Mires, MD [facility] Pulmonary/Critical Care 08/23/2022, 7:45 AM Pager:  [phone]

## 2022-08-24 ENCOUNTER — Encounter: Payer: Self-pay | Admitting: Family Medicine

## 2022-08-26 NOTE — Telephone Encounter (Signed)
Patient called about the message he sent about the Ozempic.

## 2022-08-27 ENCOUNTER — Encounter: Payer: Self-pay | Admitting: Family Medicine

## 2022-08-27 MED ORDER — SEMAGLUTIDE (2 MG/DOSE) 8 MG/3ML ~~LOC~~ SOPN
2.0000 mg | PEN_INJECTOR | SUBCUTANEOUS | 3 refills | Status: DC
Start: 2022-08-27 — End: 2022-12-02

## 2022-08-27 NOTE — Telephone Encounter (Signed)
Plz notify sent in.

## 2022-08-27 NOTE — Telephone Encounter (Signed)
Spoke with pt relaying Dr. G's message.  Pt expresses his thanks.  ?

## 2022-08-27 NOTE — Telephone Encounter (Signed)
Ozempic Last rx:  06/19/22, #3 mL Last OV:  05/08/22, panic attacks; DM f/u Next OV:  none

## 2022-08-28 MED ORDER — TRULICITY 1.5 MG/0.5ML ~~LOC~~ SOAJ: 1.5000 mg | SUBCUTANEOUS | 1 refills | Status: DC

## 2022-08-28 NOTE — Addendum Note (Signed)
Addended by: Ria Bush on: 08/28/2022 07:58 AM   Modules accepted: Orders

## 2022-08-31 ENCOUNTER — Other Ambulatory Visit: Payer: Self-pay | Admitting: Family Medicine

## 2022-09-02 NOTE — Telephone Encounter (Signed)
Please call patient and schedule CPE after 09/21/22.

## 2022-09-03 NOTE — Telephone Encounter (Signed)
Spoke to pt, scheduled cpe for 12/02/22

## 2022-09-26 DIAGNOSIS — Z03818 Encounter for observation for suspected exposure to other biological agents ruled out: Secondary | ICD-10-CM | POA: Diagnosis not present

## 2022-09-26 DIAGNOSIS — B9689 Other specified bacterial agents as the cause of diseases classified elsewhere: Secondary | ICD-10-CM | POA: Diagnosis not present

## 2022-09-26 DIAGNOSIS — J019 Acute sinusitis, unspecified: Secondary | ICD-10-CM | POA: Diagnosis not present

## 2022-09-28 ENCOUNTER — Encounter: Payer: Self-pay | Admitting: Family Medicine

## 2022-10-01 ENCOUNTER — Ambulatory Visit: Payer: BC Managed Care – PPO

## 2022-10-01 DIAGNOSIS — G4733 Obstructive sleep apnea (adult) (pediatric): Secondary | ICD-10-CM | POA: Diagnosis not present

## 2022-10-01 DIAGNOSIS — R0683 Snoring: Secondary | ICD-10-CM

## 2022-10-04 DIAGNOSIS — G4733 Obstructive sleep apnea (adult) (pediatric): Secondary | ICD-10-CM | POA: Diagnosis not present

## 2022-10-24 ENCOUNTER — Encounter: Payer: Self-pay | Admitting: Family Medicine

## 2022-10-25 ENCOUNTER — Encounter: Payer: Self-pay | Admitting: Family Medicine

## 2022-10-25 ENCOUNTER — Ambulatory Visit: Payer: BC Managed Care – PPO | Admitting: Family Medicine

## 2022-10-25 VITALS — BP 134/82 | HR 100 | Temp 97.5°F | Ht 71.0 in | Wt 305.2 lb

## 2022-10-25 DIAGNOSIS — E1140 Type 2 diabetes mellitus with diabetic neuropathy, unspecified: Secondary | ICD-10-CM | POA: Diagnosis not present

## 2022-10-25 DIAGNOSIS — E1165 Type 2 diabetes mellitus with hyperglycemia: Secondary | ICD-10-CM | POA: Diagnosis not present

## 2022-10-25 DIAGNOSIS — R350 Frequency of micturition: Secondary | ICD-10-CM | POA: Diagnosis not present

## 2022-10-25 DIAGNOSIS — R1030 Lower abdominal pain, unspecified: Secondary | ICD-10-CM | POA: Diagnosis not present

## 2022-10-25 LAB — POC URINALSYSI DIPSTICK (AUTOMATED)
Bilirubin, UA: NEGATIVE
Glucose, UA: NEGATIVE
Ketones, UA: NEGATIVE
Leukocytes, UA: NEGATIVE
Nitrite, UA: NEGATIVE
Protein, UA: NEGATIVE
Spec Grav, UA: >=1.03 — AB (ref 1.010–1.025)
Urobilinogen, UA: 0.2 E.U./dL
pH, UA: 6 (ref 5.0–8.0)

## 2022-10-25 LAB — POCT GLYCOSYLATED HEMOGLOBIN (HGB A1C): Hemoglobin A1C: 11.7 % — AB (ref 4.0–5.6)

## 2022-10-25 MED ORDER — GLIMEPIRIDE 2 MG PO TABS: 2.0000 mg | ORAL_TABLET | Freq: Every day | ORAL | 3 refills | Status: DC

## 2022-10-25 NOTE — Assessment & Plan Note (Addendum)
Check UA, UCx.  No other GI symptoms.

## 2022-10-25 NOTE — Patient Instructions (Addendum)
Urine culture sent.  Continue actos and metformin. Finish trulicity, then restart ozempic '2mg'$  weekly. Increase water intake. Decrease carbs and sugars in diet.  Restart glimepiride (Amaryl) '2mg'$  daily with breakfast. If all the doesn't help control sugar over the next 1 week, let me know to start daily insulin shot in place of glimepiride.

## 2022-10-25 NOTE — Telephone Encounter (Signed)
Plz triage pt.  °

## 2022-10-25 NOTE — Telephone Encounter (Signed)
Spoke to patient by telephone and was advised that he can come today at 4:00 pm for an appointment.

## 2022-10-25 NOTE — Assessment & Plan Note (Signed)
Marked deterioration of unclear cause, anticipate multifactorial.  He did recently have respiratory infection treated with doxycycline and '20mg'$  prednisone burst - ?infection vs steroid induced hyperglycemia.  No obvious ongoing infection - I did send UCx given lower abd pain and urinary frequency however anticipate hyperglycemia causing polyuria.  He'd also been out of [place] for about a month, restarted 3 wks ago.  Discussed options of daily basal insulin vs oral sulfonylurea -recommending insulin as best option to resolve hyperglycemia however he prefers to try oral route - will start glimepiride '2mg'$  daily with breakfast in addition to regular regimen. Also reviewed importance of low sugar diet and increased water intake.  I asked him to monitor sugars and send me update over next few days, low threshold to start daily insulin injections.

## 2022-10-25 NOTE — Telephone Encounter (Signed)
Patient returned call to be triaged regarding his sugar. Would like a call back.

## 2022-10-25 NOTE — Telephone Encounter (Signed)
Spoke to patient by telephone and was advised that his blood sugar was high and the meter would not give him a number once yesterday. Patient stated later yesterday his blood sugars were running over 400. Patient stated that he took his blood sugar about an hours ago before eating lunch and it was 296. Patient stated that he is at work today. Patient stated that he was out of his trulicity for a while but has been on it now for about 3 weeks. Patient stated that he has not missed any of his medication for diabetes. Patient stated that he has been urinating a lot of the past 2-3 days and has some abdominal pain also. Patient denies a history of UTI's and does not have any burning with urination.

## 2022-10-25 NOTE — Telephone Encounter (Signed)
Tried to contact patient by telephone to triage. Left a message on voicemail for patient to call the office back. Will also send him a mychart message to call the office.

## 2022-10-25 NOTE — Telephone Encounter (Signed)
Would offer 4pm appt today for evaluation.

## 2022-10-25 NOTE — Progress Notes (Signed)
Patient ID: Jorge Boyle, male    DOB: 12/14/1975, 47 y.o.   MRN: [id number]  This visit was conducted in person.  BP 134/82   Pulse 100   Temp (!) 97.5 F (36.4 C) (Temporal)   Ht '5\' 11"'$  (1.803 m)   Wt (!) 305 lb 4 oz (138.5 kg) Comment: Wearin steel-toed boots  SpO2 94%   BMI 42.57 kg/m    CC: hyperglycemia, cough Subjective:   HPI: Jorge Boyle is a 47 y.o. male presenting on 10/25/2022 for Diabetes (C/o higher than usual BS readings. Also, c/o frequent urination and low abd pain.  ) and Cough (C/o ongoing cough. Seen at [place] on 09/26/22 for sinusitis and treated. )   2-3 d h/o lower abd pain associated with increased urinary frequency. Appetite ok. No fevers/chills, dysuria, diarrhea, chest pain, lower back pain, rectal pain or pressure, nausea/vomiting.   DM - normally on metformin '1000mg'$  bid and actos '15mg'$  daily. Off and on GLP1RA use - see below. Cbg's have been 400+, today 296. Most recently 56 when he left work.   Currently taking Trulicity 1.'5mg'$  weekly - initially changed due to ozempic shortage, but just received ozempic supply from mail order pharmacy - planning to restart ozempic '2mg'$  weeky.  Seen at [facility] 09/26/2022 with dx acute sinusitis treated with flonase, tessalon, doxycycline, prednisone '20mg'$  5d course. Tested negative for flu, RSV, COVID at that time. Sugars were a little high then, but nowhere near as high as this week. Ongoing cough since then.      Relevant past medical, surgical, family and social history reviewed and updated as indicated. Interim medical history since our last visit reviewed. Allergies and medications reviewed and updated. Outpatient Medications Prior to Visit  Medication Sig Dispense Refill   atorvastatin (LIPITOR) 20 MG tablet TAKE 1 TABLET BY MOUTH EVERY DAY 90 tablet 0   Blood Glucose Monitoring Suppl (CONTOUR NEXT MONITOR) w/Device KIT Use as instructed to check blood sugar once daily. 1 kit 0   colchicine 0.6  MG tablet TAKE 1 TABLET (0.6 MG TOTAL) BY MOUTH DAILY AS NEEDED (GOUT FLARE). MAY TAKE 2 TABLETS ON FIRST DAY OF USE 30 tablet 3   cyclobenzaprine (FLEXERIL) 10 MG tablet Take 0.5-1 tablets (5-10 mg total) by mouth 2 (two) times daily as needed for muscle spasms (sedation precautions). 30 tablet 1   Dulaglutide (TRULICITY) 1.5 FA/2.1HY SOPN Inject 1.5 mg into the skin once a week. 2 mL 1   fluticasone (FLONASE) 50 MCG/ACT nasal spray Place 2 sprays into both nostrils daily. 48 g 3   glucose blood (CONTOUR NEXT TEST) test strip Use as instructed to check blood sugar once daily. 100 each 3   hydrOXYzine (ATARAX) 25 MG tablet TAKE 0.5-1 TABLETS BY MOUTH 2 TIMES DAILY AS NEEDED FOR ANXIETY (SEDATION PRECAUTIONS). 90 tablet 1   levothyroxine (SYNTHROID) 175 MCG tablet TAKE 1 TABLET BY MOUTH DAILY BEFORE BREAKFAST. 90 tablet 0   losartan (COZAAR) 100 MG tablet TAKE 1 TABLET BY MOUTH EVERY DAY 90 tablet 0   metFORMIN (GLUCOPHAGE) 1000 MG tablet Take 1 tablet (1,000 mg total) by mouth 2 (two) times daily with a meal. 180 tablet 3   Microlet Lancets MISC Use as directed to check blood sugars once daily. 100 each 3   naproxen (NAPROSYN) 500 MG tablet TAKE 1 TABLET BY MOUTH TWICE A DAY FOR 1 WEEK THEN AS NEEDED FOR PAIN TAKE WITH FOOD 50 tablet 0   pioglitazone (ACTOS)  15 MG tablet Take 1 tablet (15 mg total) by mouth daily. 90 tablet 3   Semaglutide, 2 MG/DOSE, 8 MG/3ML SOPN Inject 2 mg as directed once a week. 9 mL 3   polyethylene glycol-electrolytes (NULYTELY) 420 g solution 2 days before colonoscopy at 5pm Mix bowel prep with luke warm water.  Drink 8 oz every 15-20 mins until half has been completed.  10pm drink remaining half drinking 8 oz every 15-20 mins until entire contents have been completed. 1 day before colonoscopy drink 8 oz every 15-20 mins until half has been completed.  5 hours prior to colonoscopy  drink remaining half drinking 8 oz every 15-20 mins until entire contents have been completed.  Finish 2 hours prior to colonoscopy time. 8000 mL 0   No facility-administered medications prior to visit.     Per HPI unless specifically indicated in ROS section below Review of Systems  Objective:  BP 134/82   Pulse 100   Temp (!) 97.5 F (36.4 C) (Temporal)   Ht '5\' 11"'$  (1.803 m)   Wt (!) 305 lb 4 oz (138.5 kg) Comment: Wearin steel-toed boots  SpO2 94%   BMI 42.57 kg/m   Wt Readings from Last 3 Encounters:  10/25/22 (!) 305 lb 4 oz (138.5 kg)  08/20/22 (!) 303 lb (137.4 kg)  06/17/22 300 lb (136.1 kg)      Physical Exam Vitals and nursing note reviewed.  Constitutional:      Appearance: Normal appearance. He is not ill-appearing.  HENT:     Head: Normocephalic and atraumatic.     Right Ear: Tympanic membrane, ear canal and external ear normal.     Left Ear: Tympanic membrane, ear canal and external ear normal.     Nose: Nose normal.     Mouth/Throat:     Mouth: Mucous membranes are moist.     Pharynx: Oropharynx is clear. Posterior oropharyngeal erythema (mild) present. No oropharyngeal exudate.  Eyes:     Extraocular Movements: Extraocular movements intact.     Conjunctiva/sclera: Conjunctivae normal.     Pupils: Pupils are equal, round, and reactive to light.  Cardiovascular:     Rate and Rhythm: Normal rate and regular rhythm.     Pulses: Normal pulses.     Heart sounds: Normal heart sounds. No murmur heard. Pulmonary:     Effort: Pulmonary effort is normal. No respiratory distress.     Breath sounds: Normal breath sounds. No wheezing, rhonchi or rales.  Abdominal:     General: Abdomen is protuberant. Bowel sounds are normal. There is no distension.     Palpations: Abdomen is soft. There is no mass.     Tenderness: There is no abdominal tenderness. There is no guarding or rebound.     Hernia: No hernia is present.  Musculoskeletal:     Cervical back: Normal range of motion and neck supple.     Right lower leg: No edema.     Left lower leg: No edema.      Comments: See HPI for foot exam if done  Lymphadenopathy:     Cervical: No cervical adenopathy.  Skin:    General: Skin is warm and dry.     Findings: No rash.  Neurological:     Mental Status: He is alert.  Psychiatric:        Mood and Affect: Mood normal.        Behavior: Behavior normal.       Results for orders placed or  performed in visit on 10/25/22  POCT glycosylated hemoglobin (Hb A1C)  Result Value Ref Range   Hemoglobin A1C 11.7 (A) 4.0 - 5.6 %   HbA1c POC (<> result, manual entry)     HbA1c, POC (prediabetic range)     HbA1c, POC (controlled diabetic range)    POCT Urinalysis Dipstick (Automated)  Result Value Ref Range   Color, UA dark yellow    Clarity, UA clear    Glucose, UA Negative Negative   Bilirubin, UA negative    Ketones, UA negative    Spec Grav, UA >=1.030 (A) 1.010 - 1.025   Blood, UA +/-    pH, UA 6.0 5.0 - 8.0   Protein, UA Negative Negative   Urobilinogen, UA 0.2 0.2 or 1.0 E.U./dL   Nitrite, UA negative    Leukocytes, UA Negative Negative    Assessment & Plan:   Problem List Items Addressed This Visit     Type 2 diabetes mellitus with diabetic neuropathy, unspecified (HCC)   Relevant Medications   glimepiride (AMARYL) 2 MG tablet   Other Relevant Orders   POCT glycosylated hemoglobin (Hb A1C) (Completed)   Lower abdominal pain    Check UA, UCx.  No other GI symptoms.      Relevant Orders   Urine Culture   Type 2 diabetes mellitus with hyperglycemia (HCC) - Primary    Marked deterioration of unclear cause, anticipate multifactorial.  He did recently have respiratory infection treated with doxycycline and '20mg'$  prednisone burst - ?infection vs steroid induced hyperglycemia.  No obvious ongoing infection - I did send UCx given lower abd pain and urinary frequency however anticipate hyperglycemia causing polyuria.  He'd also been out of [place] for about a month, restarted 3 wks ago.  Discussed options of daily basal insulin vs oral  sulfonylurea -recommending insulin as best option to resolve hyperglycemia however he prefers to try oral route - will start glimepiride '2mg'$  daily with breakfast in addition to regular regimen. Also reviewed importance of low sugar diet and increased water intake.  I asked him to monitor sugars and send me update over next few days, low threshold to start daily insulin injections.      Relevant Medications   glimepiride (AMARYL) 2 MG tablet   Other Visit Diagnoses     Urine frequency       Relevant Orders   POCT Urinalysis Dipstick (Automated) (Completed)   Urine Culture        Meds ordered this encounter  Medications   glimepiride (AMARYL) 2 MG tablet    Sig: Take 1 tablet (2 mg total) by mouth daily before breakfast.    Dispense:  30 tablet    Refill:  3   Orders Placed This Encounter  Procedures   Urine Culture   POCT glycosylated hemoglobin (Hb A1C)   POCT Urinalysis Dipstick (Automated)    Patient Instructions  Urine culture sent.  Continue actos and metformin. Finish trulicity, then restart ozempic '2mg'$  weekly. Increase water intake. Decrease carbs and sugars in diet.  Restart glimepiride (Amaryl) '2mg'$  daily with breakfast. If all the doesn't help control sugar over the next 1 week, let me know to start daily insulin shot in place of glimepiride.   Follow up plan: Return if symptoms worsen or fail to improve.  Ria Bush, MD

## 2022-10-25 NOTE — Telephone Encounter (Signed)
Noted. See other note.

## 2022-10-26 LAB — URINE CULTURE
MICRO NUMBER:: 14394315
Result:: NO GROWTH
SPECIMEN QUALITY:: ADEQUATE

## 2022-10-29 ENCOUNTER — Ambulatory Visit: Payer: BC Managed Care – PPO | Admitting: Primary Care

## 2022-10-29 ENCOUNTER — Encounter: Payer: Self-pay | Admitting: Primary Care

## 2022-10-29 VITALS — BP 128/82 | HR 94 | Temp 98.2°F | Ht 70.0 in | Wt 304.2 lb

## 2022-10-29 DIAGNOSIS — G4730 Sleep apnea, unspecified: Secondary | ICD-10-CM

## 2022-10-29 DIAGNOSIS — Z6841 Body Mass Index (BMI) 40.0 and over, adult: Secondary | ICD-10-CM

## 2022-10-29 NOTE — Assessment & Plan Note (Signed)
-   Patient has snoring symptoms. HST on 10/01/22 which showed mild OSA, AHI 8.3/hr with SpO2 low 85%.  We reviewed treatment options including weight loss, oral appliance, CPAP therapy or referral to ENT for possible surgical options. He reports having difficulty losing weight. He does no think that he would be able to afford a CPAP machine. He is interested in referral to both healthy weight and wellness and orthodontics. He is also going to look at getting a wedge pillow to sleep on at night. Follow-up in 6 months or sooner if needed.

## 2022-10-29 NOTE — Progress Notes (Signed)
$'@Patient'e$  ID: Jorge Boyle, male    DOB: August 12, 1976, 47 y.o.   MRN: [id number]  Chief Complaint  Patient presents with   Follow-up    HST:10/01/2022    Referring provider: Ria Bush, MD  HPI: 47 year old male, never smoked.  Past medical history significant for hypertension, cardiomegaly, hepatic steatosis, type 2 diabetes, thyroid nodule s/p thyroidectomy, gout, anxiety, obesity.  Previous [facility] pulmonary encounter: 08/20/2022 Patient presents today for sleep consult. He has symptoms of restless sleep, snoring and daytime somnolence. He does not feel that he sleeps well at night. His brother has told him that he can hear him snoring on the other side of the house. He gets between 5-7 hours of sleep a night. Typical bedtime is 9:30-10pm.  He is a side sleeper. It does not take him long to fall asleep. He wakes up 2-3 times a night. His dogs sleep in his room with him. He starts his day anytime between 3-5am. He is tired when he wakes up in the morning.  He doses off easily when in active. Denies narcolepsy, cataplexy or sleepwalking.  Sleep questionnaire Symptoms- fragmented sleep, snoring Previous sleep study- none Typical bedtime- 9:30pm Time to fall asleep - not long Nocturnal awakenings- -2 to 3 times Out of bed/start of day- 3-5am Weight changes- 30 lbs  Do you operate heavy machinery- yes Do you wear CPAP- no Do you wear oxygen- no Epworth score- 7 Sleep medication or SSRIs- Atarax 12.5-'25mg'$  as needed prn anxiety    10/29/2022 - Interim hx  Patient presents today for sleep follow-up. He was told by his brother that he snores. It does not take him long to fall asleep. He is primarily a side sleeper. He is not bothered by his snoring. Does not wake up gasping/choking for air at night.  He had HST on 10/01/22 showed mild OSA, AHI 8.3/hour with Spo2 low 85%. We reviewed treatment options including weight loss, oral appliance, CPAP therapy or referral to ENT for possible  surgical options. He reports having difficulty losing weight. He does no think that he would be able to afford a CPAP machine. He is interested in referral to both healthy weight and wellness and orthodontics. He is also going to look at getting a wedge pillow to sleep on at night.    Allergies  Allergen Reactions   Penicillins Rash    Has patient had a PCN reaction causing immediate rash, facial/tongue/throat swelling, SOB or lightheadedness with hypotension: Yes Has patient had a PCN reaction causing severe rash involving mucus membranes or skin necrosis: No Has patient had a PCN reaction that required hospitalization: Unknown Has patient had a PCN reaction occurring within the last 10 years: No If all of the above answers are "NO", then may proceed with Cephalosporin use.    Lisinopril     Throat irritation, dry cough    Immunization History  Administered Date(s) Administered   COVID-19, mRNA, vaccine(Comirnaty)12 years and older 09/13/2022   Influenza,inj,Quad PF,6+ Mos 07/06/2013, 07/26/2015, 07/26/2016, 06/29/2018, 07/09/2019, 08/25/2020, 09/21/2021, 08/20/2022   PFIZER(Purple Top)SARS-COV-2 Vaccination 01/31/2020, 02/21/2020, 10/11/2020   Pfizer Covid-19 Vaccine Bivalent Booster 72yr & up 08/01/2021   Pneumococcal Polysaccharide-23 07/26/2015   Tdap 01/01/2012    Past Medical History:  Diagnosis Date   Allergic rhinitis    Diabetes mellitus without complication ([facility]    Difficult intubation    PT DOES NOT REMEMBER THIS   Headache    History of chicken pox    History of kidney stones  Hypertension    Multiple thyroid nodules 01/2012   left, biopsy x2 benign, rpt [place] stable (01/2013, 07/2015) rec rpt 1 yr   Nontoxic nodular goiter    Obesity    Prediabetes    S/P excision of thyroid adenoma 2005   R thyroidectomy    Tobacco History: Social History   Tobacco Use  Smoking Status Never  Smokeless Tobacco Never   Counseling given: Not Answered   Outpatient  Medications Prior to Visit  Medication Sig Dispense Refill   atorvastatin (LIPITOR) 20 MG tablet TAKE 1 TABLET BY MOUTH EVERY DAY 90 tablet 0   Blood Glucose Monitoring Suppl (CONTOUR NEXT MONITOR) w/Device KIT Use as instructed to check blood sugar once daily. 1 kit 0   colchicine 0.6 MG tablet TAKE 1 TABLET (0.6 MG TOTAL) BY MOUTH DAILY AS NEEDED (GOUT FLARE). MAY TAKE 2 TABLETS ON FIRST DAY OF USE 30 tablet 3   cyclobenzaprine (FLEXERIL) 10 MG tablet Take 0.5-1 tablets (5-10 mg total) by mouth 2 (two) times daily as needed for muscle spasms (sedation precautions). 30 tablet 1   Dulaglutide (TRULICITY) 1.5 YH/0.6CB SOPN Inject 1.5 mg into the skin once a week. 2 mL 1   fluticasone (FLONASE) 50 MCG/ACT nasal spray Place 2 sprays into both nostrils daily. 48 g 3   glimepiride (AMARYL) 2 MG tablet Take 1 tablet (2 mg total) by mouth daily before breakfast. 30 tablet 3   glucose blood (CONTOUR NEXT TEST) test strip Use as instructed to check blood sugar once daily. 100 each 3   hydrOXYzine (ATARAX) 25 MG tablet TAKE 0.5-1 TABLETS BY MOUTH 2 TIMES DAILY AS NEEDED FOR ANXIETY (SEDATION PRECAUTIONS). 90 tablet 1   levothyroxine (SYNTHROID) 175 MCG tablet TAKE 1 TABLET BY MOUTH DAILY BEFORE BREAKFAST. 90 tablet 0   losartan (COZAAR) 100 MG tablet TAKE 1 TABLET BY MOUTH EVERY DAY 90 tablet 0   metFORMIN (GLUCOPHAGE) 1000 MG tablet Take 1 tablet (1,000 mg total) by mouth 2 (two) times daily with a meal. 180 tablet 3   Microlet Lancets MISC Use as directed to check blood sugars once daily. 100 each 3   naproxen (NAPROSYN) 500 MG tablet TAKE 1 TABLET BY MOUTH TWICE A DAY FOR 1 WEEK THEN AS NEEDED FOR PAIN TAKE WITH FOOD 50 tablet 0   pioglitazone (ACTOS) 15 MG tablet Take 1 tablet (15 mg total) by mouth daily. 90 tablet 3   Semaglutide, 2 MG/DOSE, 8 MG/3ML SOPN Inject 2 mg as directed once a week. 9 mL 3   No facility-administered medications prior to visit.    Review of Systems  Review of Systems   Constitutional: Negative.   HENT: Negative.    Respiratory: Negative.    Cardiovascular: Negative.    Physical Exam  BP 128/82 (BP Location: Left Arm, Patient Position: Sitting, Cuff Size: Normal)   Pulse 94   Temp 98.2 F (36.8 C) (Oral)   Ht '5\' 10"'$  (1.778 m)   Wt (!) 304 lb 3.2 oz (138 kg)   SpO2 97%   BMI 43.65 kg/m  Physical Exam Constitutional:      Appearance: Normal appearance. He is obese. He is not ill-appearing.  HENT:     Head: Normocephalic and atraumatic.     Mouth/Throat:     Mouth: Mucous membranes are moist.     Pharynx: Oropharynx is clear.  Cardiovascular:     Rate and Rhythm: Normal rate and regular rhythm.  Pulmonary:     Effort: Pulmonary effort  is normal.     Breath sounds: Normal breath sounds.  Musculoskeletal:        General: Normal range of motion.  Skin:    General: Skin is warm.  Neurological:     General: No focal deficit present.     Mental Status: He is alert and oriented to person, place, and time. Mental status is at baseline.  Psychiatric:        Mood and Affect: Mood normal.        Behavior: Behavior normal.        Thought Content: Thought content normal.        Judgment: Judgment normal.      Lab Results:  CBC    Component Value Date/Time   WBC 11.9 (H) 05/08/2022 1627   RBC 4.38 05/08/2022 1627   HGB 13.4 05/08/2022 1627   HGB 14.5 12/23/2013 0852   HCT 40.1 05/08/2022 1627   HCT 42.6 12/23/2013 0852   PLT 286.0 05/08/2022 1627   PLT 277 12/23/2013 0852   MCV 91.7 05/08/2022 1627   MCV 91 12/23/2013 0852   MCH 29.8 09/21/2021 1551   MCHC 33.3 05/08/2022 1627   RDW 14.2 05/08/2022 1627   RDW 13.6 12/23/2013 0852   LYMPHSABS 2.1 05/08/2022 1627   MONOABS 0.6 05/08/2022 1627   EOSABS 0.3 05/08/2022 1627   BASOSABS 0.1 05/08/2022 1627    BMET    Component Value Date/Time   NA 143 05/08/2022 1627   NA 138 12/23/2013 0852   K 4.0 05/08/2022 1627   K 3.7 12/23/2013 0852   K 4.0 02/17/2006 0000   CL 105  05/08/2022 1627   CL 105 12/23/2013 0852   CL 106 02/17/2006 0000   CO2 26 05/08/2022 1627   CO2 28 12/23/2013 0852   GLUCOSE 158 (H) 05/08/2022 1627   GLUCOSE 102 (H) 12/23/2013 0852   BUN 14 05/08/2022 1627   BUN 8 12/23/2013 0852   CREATININE 0.94 05/08/2022 1627   CREATININE 0.87 09/21/2021 1551   CALCIUM 9.1 05/08/2022 1627   CALCIUM 8.6 12/23/2013 0852   CALCIUM 9.5 02/17/2006 0000   GFRNONAA >60 01/27/2018 1353   GFRNONAA >60 12/23/2013 0852   GFRAA >60 01/27/2018 1353   GFRAA >60 12/23/2013 0852    BNP No results found for: "BNP"  ProBNP    Component Value Date/Time   PROBNP 10.0 05/08/2022 1712    Imaging: No results found.   Assessment & Plan:   Mild sleep apnea - Patient has snoring symptoms. HST on 10/01/22 which showed mild OSA, AHI 8.3/hr with SpO2 low 85%.  We reviewed treatment options including weight loss, oral appliance, CPAP therapy or referral to ENT for possible surgical options. He reports having difficulty losing weight. He does no think that he would be able to afford a CPAP machine. He is interested in referral to both healthy weight and wellness and orthodontics. He is also going to look at getting a wedge pillow to sleep on at night. Follow-up in 6 months or sooner if needed.    Martyn Ehrich, NP 10/29/2022

## 2022-10-29 NOTE — Progress Notes (Signed)
Reviewed and agree with assessment/plan.   Chesley Mires, MD [facility] Pulmonary/Critical Care 10/29/2022, 10:09 AM Pager:  [phone]

## 2022-10-29 NOTE — Patient Instructions (Addendum)
HST on 10/01/22 showed mild sleep apnea, average apnea 8.3/hour with Spo2 low 85%.   Referral: Healthy weight and wellness (ordered) Orthodontics  (ordered)  Recommendations: Look online for wedge pillow for sleep apnea  Focus on weight loss and side sleeping position Do not drive if tired   Follow-up: 6 months with [facility] NP or sooner if needed [facility])   Sleep Apnea Sleep apnea is a condition in which breathing pauses or becomes shallow during sleep. People with sleep apnea usually snore loudly. They may have times when they gasp and stop breathing for 10 seconds or more during sleep. This may happen many times during the night. Sleep apnea disrupts your sleep and keeps your body from getting the rest that it needs. This condition can increase your risk of certain health problems, including: Heart attack. Stroke. Obesity. Type 2 diabetes. Heart failure. Irregular heartbeat. High blood pressure. The goal of treatment is to help you breathe normally again. What are the causes?  The most common cause of sleep apnea is a collapsed or blocked airway. There are three kinds of sleep apnea: Obstructive sleep apnea. This kind is caused by a blocked or collapsed airway. Central sleep apnea. This kind happens when the part of the brain that controls breathing does not send the correct signals to the muscles that control breathing. Mixed sleep apnea. This is a combination of obstructive and central sleep apnea. What increases the risk? You are more likely to develop this condition if you: Are overweight. Smoke. Have a smaller than normal airway. Are older. Are male. Drink alcohol. Take sedatives or tranquilizers. Have a family history of sleep apnea. Have a tongue or tonsils that are larger than normal. What are the signs or symptoms? Symptoms of this condition include: Trouble staying asleep. Loud snoring. Morning headaches. Waking up gasping. Dry mouth or sore throat in the  morning. Daytime sleepiness and tiredness. If you have daytime fatigue because of sleep apnea, you may be more likely to have: Trouble concentrating. Forgetfulness. Irritability or mood swings. Personality changes. Feelings of depression. Sexual dysfunction. This may include loss of interest if you are male, or erectile dysfunction if you are male. How is this diagnosed? This condition may be diagnosed with: A medical history. A physical exam. A series of tests that are done while you are sleeping (sleep study). These tests are usually done in a sleep lab, but they may also be done at home. How is this treated? Treatment for this condition aims to restore normal breathing and to ease symptoms during sleep. It may involve managing health issues that can affect breathing, such as high blood pressure or obesity. Treatment may include: Sleeping on your side. Using a decongestant if you have nasal congestion. Avoiding the use of depressants, including alcohol, sedatives, and narcotics. Losing weight if you are overweight. Making changes to your diet. Quitting smoking. Using a device to open your airway while you sleep, such as: An oral appliance. This is a custom-made mouthpiece that shifts your lower jaw forward. A continuous positive airway pressure (CPAP) device. This device blows air through a mask when you breathe out (exhale). A nasal expiratory positive airway pressure (EPAP) device. This device has valves that you put into each nostril. A bi-level positive airway pressure (BIPAP) device. This device blows air through a mask when you breathe in (inhale) and breathe out (exhale). Having surgery if other treatments do not work. During surgery, excess tissue is removed to create a wider airway. Follow these  instructions at home: Lifestyle Make any lifestyle changes that your health care provider recommends. Eat a healthy, well-balanced diet. Take steps to lose weight if you are  overweight. Avoid using depressants, including alcohol, sedatives, and narcotics. Do not use any products that contain nicotine or tobacco. These products include cigarettes, chewing tobacco, and vaping devices, such as e-cigarettes. If you need help quitting, ask your health care provider. General instructions Take over-the-counter and prescription medicines only as told by your health care provider. If you were given a device to open your airway while you sleep, use it only as told by your health care provider. If you are having surgery, make sure to tell your health care provider you have sleep apnea. You may need to bring your device with you. Keep all follow-up visits. This is important. Contact a health care provider if: The device that you received to open your airway during sleep is uncomfortable or does not seem to be working. Your symptoms do not improve. Your symptoms get worse. Get help right away if: You develop: Chest pain. Shortness of breath. Discomfort in your back, arms, or stomach. You have: Trouble speaking. Weakness on one side of your body. Drooping in your face. These symptoms may represent a serious problem that is an emergency. Do not wait to see if the symptoms will go away. Get medical help right away. Call your local emergency services (911 in the [place] Do not drive yourself to the hospital. Summary Sleep apnea is a condition in which breathing pauses or becomes shallow during sleep. The most common cause is a collapsed or blocked airway. The goal of treatment is to restore normal breathing and to ease symptoms during sleep. This information is not intended to replace advice given to you by your health care provider. Make sure you discuss any questions you have with your health care provider. Document Revised: 05/16/2021 Document Reviewed: 09/15/2020 Elsevier Patient Education  [employer].

## 2022-10-31 ENCOUNTER — Encounter: Payer: Self-pay | Admitting: Family Medicine

## 2022-11-06 DIAGNOSIS — J019 Acute sinusitis, unspecified: Secondary | ICD-10-CM | POA: Diagnosis not present

## 2022-11-06 DIAGNOSIS — J209 Acute bronchitis, unspecified: Secondary | ICD-10-CM | POA: Diagnosis not present

## 2022-11-06 DIAGNOSIS — Z03818 Encounter for observation for suspected exposure to other biological agents ruled out: Secondary | ICD-10-CM | POA: Diagnosis not present

## 2022-11-06 DIAGNOSIS — U071 COVID-19: Secondary | ICD-10-CM | POA: Diagnosis not present

## 2022-11-07 ENCOUNTER — Encounter: Payer: Self-pay | Admitting: Family Medicine

## 2022-11-21 ENCOUNTER — Encounter: Payer: Self-pay | Admitting: Family Medicine

## 2022-11-30 ENCOUNTER — Other Ambulatory Visit: Payer: Self-pay | Admitting: Family Medicine

## 2022-12-02 ENCOUNTER — Ambulatory Visit (INDEPENDENT_AMBULATORY_CARE_PROVIDER_SITE_OTHER): Payer: BC Managed Care – PPO | Admitting: Family Medicine

## 2022-12-02 ENCOUNTER — Encounter: Payer: Self-pay | Admitting: Family Medicine

## 2022-12-02 VITALS — BP 126/84 | HR 88 | Temp 97.4°F | Ht 69.5 in | Wt 301.2 lb

## 2022-12-02 DIAGNOSIS — E039 Hypothyroidism, unspecified: Secondary | ICD-10-CM

## 2022-12-02 DIAGNOSIS — E1140 Type 2 diabetes mellitus with diabetic neuropathy, unspecified: Secondary | ICD-10-CM

## 2022-12-02 DIAGNOSIS — M1A00X0 Idiopathic chronic gout, unspecified site, without tophus (tophi): Secondary | ICD-10-CM | POA: Diagnosis not present

## 2022-12-02 DIAGNOSIS — E785 Hyperlipidemia, unspecified: Secondary | ICD-10-CM

## 2022-12-02 DIAGNOSIS — Z23 Encounter for immunization: Secondary | ICD-10-CM

## 2022-12-02 DIAGNOSIS — K760 Fatty (change of) liver, not elsewhere classified: Secondary | ICD-10-CM

## 2022-12-02 DIAGNOSIS — Z9089 Acquired absence of other organs: Secondary | ICD-10-CM

## 2022-12-02 DIAGNOSIS — E1169 Type 2 diabetes mellitus with other specified complication: Secondary | ICD-10-CM | POA: Diagnosis not present

## 2022-12-02 DIAGNOSIS — G4730 Sleep apnea, unspecified: Secondary | ICD-10-CM

## 2022-12-02 DIAGNOSIS — E1165 Type 2 diabetes mellitus with hyperglycemia: Secondary | ICD-10-CM

## 2022-12-02 DIAGNOSIS — Z0000 Encounter for general adult medical examination without abnormal findings: Secondary | ICD-10-CM | POA: Diagnosis not present

## 2022-12-02 DIAGNOSIS — E890 Postprocedural hypothyroidism: Secondary | ICD-10-CM | POA: Diagnosis not present

## 2022-12-02 DIAGNOSIS — I10 Essential (primary) hypertension: Secondary | ICD-10-CM

## 2022-12-02 MED ORDER — PIOGLITAZONE HCL 15 MG PO TABS: 15.0000 mg | ORAL_TABLET | Freq: Every day | ORAL | 4 refills | Status: DC

## 2022-12-02 MED ORDER — LOSARTAN POTASSIUM 100 MG PO TABS: 100.0000 mg | ORAL_TABLET | Freq: Every day | ORAL | 4 refills | Status: DC

## 2022-12-02 MED ORDER — ATORVASTATIN CALCIUM 20 MG PO TABS: 20.0000 mg | ORAL_TABLET | Freq: Every day | ORAL | 4 refills | Status: DC

## 2022-12-02 MED ORDER — METFORMIN HCL 1000 MG PO TABS: 1000.0000 mg | ORAL_TABLET | Freq: Two times a day (BID) | ORAL | 4 refills | Status: DC

## 2022-12-02 MED ORDER — LEVOTHYROXINE SODIUM 175 MCG PO TABS
175.0000 ug | ORAL_TABLET | Freq: Every day | ORAL | 4 refills | Status: DC
Start: 2022-12-02 — End: 2023-12-05

## 2022-12-02 MED ORDER — SEMAGLUTIDE (2 MG/DOSE) 8 MG/3ML ~~LOC~~ SOPN: 2.0000 mg | PEN_INJECTOR | SUBCUTANEOUS | 4 refills | Status: DC

## 2022-12-02 NOTE — Assessment & Plan Note (Signed)
Update uric acid levels only on PRN colchicine.  No recent gout flare.

## 2022-12-02 NOTE — Assessment & Plan Note (Signed)
Chronic, update TFTs on levothyroxine 163mg daily.

## 2022-12-02 NOTE — Assessment & Plan Note (Signed)
Preventative protocols reviewed and updated unless pt declined. Discussed healthy diet and lifestyle.  

## 2022-12-02 NOTE — Assessment & Plan Note (Signed)
Continue ozempic and actos.  Update LFT.s

## 2022-12-02 NOTE — Patient Instructions (Addendum)
Tdap today  Labs today with urine Go ahead and hold glimepiride. May take 1/2 tablet if sugars start going up.  Good to see you today.  Return as needed or in 3 months for follow up visit

## 2022-12-02 NOTE — Progress Notes (Signed)
Patient ID: Jorge Boyle, male    DOB: October 15, 1976, 47 y.o.   MRN: [id number]  This visit was conducted in person.  BP 126/84   Pulse 88   Temp (!) 97.4 F (36.3 C) (Temporal)   Ht 5' 9.5" (1.765 m)   Wt (!) 301 lb 4 oz (136.6 kg)   SpO2 97%   BMI 43.85 kg/m    CC: CPE Subjective:   HPI: Jorge Boyle is a 47 y.o. male presenting on 12/02/2022 for Annual Exam   S/p R hemithyroidectomy 2005 (R side and isthmus)  S/p total thyroidectomy [id number] - follicular adenoma with cystic degeneration Celine Ahr).   Saw pulm dx mild OSA. Rec weight loss, referred to healthy weight and wellness center as well as orthodontics. He's decided to hold off on Inspire device. He's been using wedge pillow with benefit. First bariatric clinic appt is next month.   [facility] we restarted GLP1RA Ozempic and started glimepiride 275mdaily. He finds glimepiride 51m6maused hypoglycemic symptoms. He's been taking 1mg851mD.    Preventative: COLONOSCOPY WITH PROPOFOL 06/17/2022 - HP, inflammatory polyp, 1.5cm and 2cm polyps, diverticulosis, sigmoid stricture, rpt 6 months (Ann[facility]athon Bellows)  Flu [place], 02/2020, booster 09/2020, bivalent boost 07/2021, 08/2022 Tdap 12/2011 , rpt today Pneumovax 2016  Seat belt use discussed.  Sunscreen use discussed. Denies changing moles on skin.  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 Interim medical history since our last visit  reviewed. Allergies and medications reviewed and updated. Outpatient Medications Prior to Visit  Medication Sig Dispense Refill   Blood Glucose Monitoring Suppl (CONTOUR NEXT MONITOR) w/Device KIT Use as instructed to check blood sugar once daily. 1 kit 0   colchicine 0.6 MG tablet TAKE 1 TABLET (0.6 MG TOTAL) BY MOUTH DAILY AS NEEDED (GOUT FLARE). MAY TAKE 2 TABLETS ON FIRST DAY OF USE 30 tablet 3   cyclobenzaprine (FLEXERIL) 10 MG tablet Take 0.5-1 tablets (5-10 mg total) by mouth 2 (two) times daily as needed for muscle spasms (sedation precautions). 30 tablet 1   fluticasone (FLONASE) 50 MCG/ACT nasal spray Place 2 sprays into both nostrils daily. 48 g 3   glucose blood (CONTOUR NEXT TEST) test strip Use as instructed to check blood sugar once daily. 100 each 3   hydrOXYzine (ATARAX) 25 MG tablet TAKE 0.5-1 TABLETS BY MOUTH 2 TIMES DAILY AS NEEDED FOR ANXIETY (SEDATION PRECAUTIONS). 90 tablet 1   Microlet Lancets MISC Use as directed to check blood sugars once daily. 100 each 3   naproxen (NAPROSYN) 500 MG tablet TAKE 1 TABLET BY MOUTH TWICE A DAY FOR 1 WEEK THEN AS NEEDED FOR PAIN TAKE WITH FOOD 50 tablet 0   atorvastatin (LIPITOR) 20 MG tablet TAKE 1 TABLET BY MOUTH EVERY DAY 90 tablet 0   glimepiride (AMARYL) 2 MG tablet Take 1 tablet (2 mg total) by mouth daily before breakfast. 30 tablet 3   levothyroxine (  SYNTHROID) 175 MCG tablet TAKE 1 TABLET BY MOUTH DAILY BEFORE BREAKFAST. 90 tablet 0   losartan (COZAAR) 100 MG tablet TAKE 1 TABLET BY MOUTH EVERY DAY 90 tablet 0   metFORMIN (GLUCOPHAGE) 1000 MG tablet Take 1 tablet (1,000 mg total) by mouth 2 (two) times daily with a meal. 180 tablet 3   pioglitazone (ACTOS) 15 MG tablet Take 1 tablet (15 mg total) by mouth daily. 90 tablet 3   Semaglutide, 2 MG/DOSE, 8 MG/3ML SOPN Inject 2 mg as directed once a week. 9 mL 3   Dulaglutide (TRULICITY) 1.5 0000000 SOPN Inject 1.5 mg into the skin once a week. 2 mL 1   No facility-administered  medications prior to visit.     Per HPI unless specifically indicated in ROS section below Review of Systems  Constitutional:  Negative for activity change, appetite change, chills, fatigue, fever and unexpected weight change.  HENT:  Negative for hearing loss.   Eyes:  Negative for visual disturbance.  Respiratory:  Positive for cough (occ). Negative for chest tightness, shortness of breath and wheezing.   Cardiovascular:  Negative for chest pain, palpitations and leg swelling.  Gastrointestinal:  Negative for abdominal distention, abdominal pain, blood in stool, constipation, diarrhea, nausea and vomiting.  Genitourinary:  Negative for difficulty urinating and hematuria.  Musculoskeletal:  Negative for arthralgias, myalgias and neck pain.  Skin:  Negative for rash.  Neurological:  Positive for headaches. Negative for dizziness, seizures and syncope.  Hematological:  Negative for adenopathy. Does not bruise/bleed easily.  Psychiatric/Behavioral:  Negative for dysphoric mood. The patient is not nervous/anxious.     Objective:  BP 126/84   Pulse 88   Temp (!) 97.4 F (36.3 C) (Temporal)   Ht 5' 9.5" (1.765 m)   Wt (!) 301 lb 4 oz (136.6 kg)   SpO2 97%   BMI 43.85 kg/m   Wt Readings from Last 3 Encounters:  12/02/22 (!) 301 lb 4 oz (136.6 kg)  10/29/22 (!) 304 lb 3.2 oz (138 kg)  10/25/22 (!) 305 lb 4 oz (138.5 kg)      Physical Exam Vitals and nursing note reviewed.  Constitutional:      General: He is not in acute distress.    Appearance: Normal appearance. He is well-developed. He is not ill-appearing.  HENT:     Head: Normocephalic and atraumatic.     Right Ear: Hearing, tympanic membrane, ear canal and external ear normal.     Left Ear: Hearing, tympanic membrane, ear canal and external ear normal.     Mouth/Throat:     Comments: Wearing mask Eyes:     General: No scleral icterus.    Extraocular Movements: Extraocular movements intact.     Conjunctiva/sclera:  Conjunctivae normal.     Pupils: Pupils are equal, round, and reactive to light.  Neck:     Thyroid: No thyroid mass or thyromegaly.     Vascular: No carotid bruit.  Cardiovascular:     Rate and Rhythm: Normal rate and regular rhythm.     Pulses: Normal pulses.          Radial pulses are 2+ on the right side and 2+ on the left side.     Heart sounds: Normal heart sounds. No murmur heard. Pulmonary:     Effort: Pulmonary effort is normal. No respiratory distress.     Breath sounds: Normal breath sounds. No wheezing, rhonchi or rales.  Abdominal:     General: Bowel sounds are normal. There  is no distension.     Palpations: Abdomen is soft. There is no mass.     Tenderness: There is no abdominal tenderness. There is no guarding or rebound.     Hernia: No hernia is present.  Musculoskeletal:        General: Normal range of motion.     Cervical back: Normal range of motion and neck supple.     Right lower leg: No edema.     Left lower leg: No edema.  Lymphadenopathy:     Cervical: No cervical adenopathy.  Skin:    General: Skin is warm and dry.     Findings: No rash.  Neurological:     General: No focal deficit present.     Mental Status: He is alert and oriented to person, place, and time.  Psychiatric:        Mood and Affect: Mood normal.        Behavior: Behavior normal.        Thought Content: Thought content normal.        Judgment: Judgment normal.        Assessment & Plan:   Problem List Items Addressed This Visit     Health maintenance examination - Primary (Chronic)    Preventative protocols reviewed and updated unless pt declined. Discussed healthy diet and lifestyle.       Obesity, morbid, BMI 40.0-49.9 ([place])    Continue to encourage healthy diet and lifestyle choices to affect sustainable weight loss.  Continue ozempic 59m weekly.       Relevant Medications   metFORMIN (GLUCOPHAGE) 1000 MG tablet   pioglitazone (ACTOS) 15 MG tablet   Semaglutide, 2  MG/DOSE, 8 MG/3ML SOPN   Type 2 diabetes mellitus with diabetic neuropathy, unspecified (HCC)   Relevant Medications   atorvastatin (LIPITOR) 20 MG tablet   losartan (COZAAR) 100 MG tablet   metFORMIN (GLUCOPHAGE) 1000 MG tablet   pioglitazone (ACTOS) 15 MG tablet   Semaglutide, 2 MG/DOSE, 8 MG/3ML SOPN   Gout    Update uric acid levels only on PRN colchicine.  No recent gout flare.       Relevant Orders   Uric acid   Dyslipidemia associated with type 2 diabetes mellitus ([place]    Continue atorvastatin 29mdaily. Update FLP.  The ASCVD Risk score (Arnett DK, et al., 2019) failed to calculate for the following reasons:   The valid total cholesterol range is 130 to 320 mg/dL       Relevant Medications   atorvastatin (LIPITOR) 20 MG tablet   losartan (COZAAR) 100 MG tablet   metFORMIN (GLUCOPHAGE) 1000 MG tablet   pioglitazone (ACTOS) 15 MG tablet   Semaglutide, 2 MG/DOSE, 8 MG/3ML SOPN   Other Relevant Orders   Lipid panel   Comprehensive metabolic panel   Essential hypertension    Chronic, stable on losartan - continue this.       Relevant Medications   atorvastatin (LIPITOR) 20 MG tablet   losartan (COZAAR) 100 MG tablet   S/P total thyroidectomy   Acquired hypothyroidism    Chronic, update TFTs on levothyroxine 17573mdaily.       Relevant Medications   levothyroxine (SYNTHROID) 175 MCG tablet   Other Relevant Orders   TSH   T4, free   Hepatic steatosis    Continue ozempic and actos.  Update LFT.s      Type 2 diabetes mellitus with hyperglycemia (HCC)    Chronic. Update fructosamine as too soon for A1c.  Notes some hypoglycemic symptoms on glimepiride 42m daily - will hold for now, with option to take 134mif sugars start trending up.       Relevant Medications   atorvastatin (LIPITOR) 20 MG tablet   losartan (COZAAR) 100 MG tablet   metFORMIN (GLUCOPHAGE) 1000 MG tablet   pioglitazone (ACTOS) 15 MG tablet   Semaglutide, 2 MG/DOSE, 8 MG/3ML SOPN   Other  Relevant Orders   Fructosamine   Microalbumin / creatinine urine ratio   Mild sleep apnea    Appreciate pulm care - discussing inspire device and bariatric clinic for weight loss.       Other Visit Diagnoses     Need for Tdap vaccination       Relevant Orders   Tdap vaccine greater than or equal to 7yo IM (Completed)        Meds ordered this encounter  Medications   atorvastatin (LIPITOR) 20 MG tablet    Sig: Take 1 tablet (20 mg total) by mouth daily.    Dispense:  90 tablet    Refill:  4   levothyroxine (SYNTHROID) 175 MCG tablet    Sig: Take 1 tablet (175 mcg total) by mouth daily before breakfast.    Dispense:  90 tablet    Refill:  4   losartan (COZAAR) 100 MG tablet    Sig: Take 1 tablet (100 mg total) by mouth daily.    Dispense:  90 tablet    Refill:  4   metFORMIN (GLUCOPHAGE) 1000 MG tablet    Sig: Take 1 tablet (1,000 mg total) by mouth 2 (two) times daily with a meal.    Dispense:  180 tablet    Refill:  4   pioglitazone (ACTOS) 15 MG tablet    Sig: Take 1 tablet (15 mg total) by mouth daily.    Dispense:  90 tablet    Refill:  4   Semaglutide, 2 MG/DOSE, 8 MG/3ML SOPN    Sig: Inject 2 mg as directed once a week.    Dispense:  9 mL    Refill:  4    Orders Placed This Encounter  Procedures   Tdap vaccine greater than or equal to 7yo IM   Lipid panel   Comprehensive metabolic panel   TSH   T4, free   Uric acid   Fructosamine   Microalbumin / creatinine urine ratio    Patient Instructions  Tdap today  Labs today with urine Go ahead and hold glimepiride. May take 1/2 tablet if sugars start going up.  Good to see you today.  Return as needed or in 3 months for follow up visit   Follow up plan: Return in about 3 months (around 03/02/2023) for follow up visit.  JaRia BushMD

## 2022-12-02 NOTE — Assessment & Plan Note (Addendum)
Chronic. Update fructosamine as too soon for A1c. Notes some hypoglycemic symptoms on glimepiride 12m daily - will hold for now, with option to take 173mif sugars start trending up.

## 2022-12-02 NOTE — Assessment & Plan Note (Signed)
Continue atorvastatin 78m daily. Update FLP.  The ASCVD Risk score (Arnett DK, et al., 2019) failed to calculate for the following reasons:   The valid total cholesterol range is 130 to 320 mg/dL

## 2022-12-02 NOTE — Assessment & Plan Note (Signed)
Chronic, stable on losartan - continue this.

## 2022-12-02 NOTE — Assessment & Plan Note (Signed)
Appreciate pulm care - discussing inspire device and bariatric clinic for weight loss.

## 2022-12-02 NOTE — Assessment & Plan Note (Signed)
Continue to encourage healthy diet and lifestyle choices to affect sustainable weight loss.  Continue ozempic 89m weekly.

## 2022-12-03 LAB — LIPID PANEL
Cholesterol: 123 mg/dL (ref 0–200)
HDL: 32.5 mg/dL — ABNORMAL LOW (ref >39.00)
LDL Cholesterol: 57 mg/dL (ref 0–99)
NonHDL: 90.4
Total CHOL/HDL Ratio: 4
Triglycerides: 165 mg/dL — ABNORMAL HIGH (ref 0.0–149.0)
VLDL: 33 mg/dL (ref 0.0–40.0)

## 2022-12-03 LAB — COMPREHENSIVE METABOLIC PANEL
ALT: 25 U/L (ref 0–53)
AST: 16 U/L (ref 0–37)
Albumin: 4.1 g/dL (ref 3.5–5.2)
Alkaline Phosphatase: 53 U/L (ref 39–117)
BUN: 11 mg/dL (ref 6–23)
CO2: 29 mEq/L (ref 19–32)
Calcium: 9.2 mg/dL (ref 8.4–10.5)
Chloride: 102 mEq/L (ref 96–112)
Creatinine, Ser: 0.86 mg/dL (ref 0.40–1.50)
GFR: 103.35 mL/min (ref >60.00)
Glucose, Bld: 106 mg/dL — ABNORMAL HIGH (ref 70–99)
Potassium: 4.2 mEq/L (ref 3.5–5.1)
Sodium: 141 mEq/L (ref 135–145)
Total Bilirubin: 0.3 mg/dL (ref 0.2–1.2)
Total Protein: 6.7 g/dL (ref 6.0–8.3)

## 2022-12-03 LAB — MICROALBUMIN / CREATININE URINE RATIO
Creatinine,U: 66.2 mg/dL
Microalb Creat Ratio: 1.1 mg/g (ref 0.0–30.0)
Microalb, Ur: <0.7 mg/dL (ref 0.0–1.9)

## 2022-12-03 LAB — T4, FREE: Free T4: 1.13 ng/dL (ref 0.60–1.60)

## 2022-12-03 LAB — TSH: TSH: 2.59 u[IU]/mL (ref 0.35–5.50)

## 2022-12-03 LAB — URIC ACID: Uric Acid, Serum: 6.4 mg/dL (ref 4.0–7.8)

## 2022-12-03 NOTE — Telephone Encounter (Signed)
Refills sent in at 12/02/22 OV.

## 2022-12-05 LAB — FRUCTOSAMINE: Fructosamine: 253 umol/L (ref 205–285)

## 2022-12-07 ENCOUNTER — Encounter: Payer: Self-pay | Admitting: Family Medicine

## 2022-12-14 ENCOUNTER — Other Ambulatory Visit: Payer: Self-pay | Admitting: Family Medicine

## 2022-12-23 ENCOUNTER — Encounter (INDEPENDENT_AMBULATORY_CARE_PROVIDER_SITE_OTHER): Payer: Self-pay | Admitting: Family Medicine

## 2022-12-23 ENCOUNTER — Ambulatory Visit (INDEPENDENT_AMBULATORY_CARE_PROVIDER_SITE_OTHER): Payer: BC Managed Care – PPO | Admitting: Family Medicine

## 2022-12-23 VITALS — BP 133/86 | HR 98 | Temp 98.3°F | Ht 70.0 in | Wt 298.0 lb

## 2022-12-23 DIAGNOSIS — E1165 Type 2 diabetes mellitus with hyperglycemia: Secondary | ICD-10-CM

## 2022-12-23 DIAGNOSIS — Z7985 Long-term (current) use of injectable non-insulin antidiabetic drugs: Secondary | ICD-10-CM

## 2022-12-23 DIAGNOSIS — Z6841 Body Mass Index (BMI) 40.0 and over, adult: Secondary | ICD-10-CM

## 2022-12-23 DIAGNOSIS — E669 Obesity, unspecified: Secondary | ICD-10-CM

## 2023-01-06 DIAGNOSIS — M722 Plantar fascial fibromatosis: Secondary | ICD-10-CM | POA: Diagnosis not present

## 2023-01-06 NOTE — Progress Notes (Unsigned)
Office: [phone]  /  Fax: [phone]   Initial Visit  Jorge Boyle was seen in clinic today to evaluate for obesity. He is interested in losing weight to improve overall health and reduce the risk of weight related complications. He presents today to review program treatment options, initial physical assessment, and evaluation.     He was referred by: PCP  When asked what else they would like to accomplish? He states: Improve existing medical conditions  When asked how has your weight affected you? He states: Contributed to medical problems and Having fatigue  Some associated conditions: Hypertension and Diabetes  Contributing factors: Medications and Other: bilateral thyroidectomy  Current nutrition plan: None  Current level of physical activity: None  Current or previous pharmacotherapy: GLP-1  Response to medication: Other: maintained weight, but no weight loss   Past medical history includes:   Past Medical History:  Diagnosis Date   Allergic rhinitis    Diabetes mellitus without complication ([place])    Difficult intubation    PT DOES NOT REMEMBER THIS   Headache    History of chicken pox    History of kidney stones    Hypertension    Multiple thyroid nodules 01/2012   left, biopsy x2 benign, rpt [place] stable (01/2013, 07/2015) rec rpt 1 yr   Nontoxic nodular goiter    Obesity    Prediabetes    S/P excision of thyroid adenoma 2005   R thyroidectomy     Objective:   BP 133/86   Pulse 98   Temp 98.3 F (36.8 C)   Ht 5\' 10"  (1.778 m)   Wt 298 lb (135.2 kg)   SpO2 96%   BMI 42.76 kg/m  He was weighed on the bioimpedance scale: Body mass index is 42.76 kg/m.  Peak Weight:310 lbs ,Visceral Fat Rating:24, Body Fat%:38.4, Weight trend over the last 12 months: Increasing and decreasing  Jorge Boyle is a pleasant male in no acute distress.  Appropriate mood and affect.  Assessment and Plan:  1. Type 2 diabetes mellitus with hyperglycemia, without long-term  current use of insulin (HCC) Jorge Boyle has been better controlled on Ozempic, and he had to change to Trulicity, and his glucose increased significantly.  His recent A1c was elevated at 11.7 in January 2024, but he states going back on Ozempic improved his glucose.  Jorge Boyle will continue Ozempic, Actos, and metformin.  I recommended that he start working on a structured weight loss and nutritional program to get tight control of his glucose and prevent complications like heart disease and neuropathy.   2. Obesity with starting BMI of42.8 We reviewed weight, biometrics, associated medical conditions and contributing factors with patient. He would benefit from weight loss therapy via a modified calorie, low-carb, high-protein nutritional plan tailored to their REE (resting energy expenditure) which will be determined by indirect calorimetry.  We will also assess for cardiometabolic risk and nutritional derangements via fasting serologies at his next appointment.      Obesity Treatment / Action Plan:  Patient will work on garnering support from family and friends to begin weight loss journey. Will work on eliminating or reducing the presence of highly palatable, calorie dense foods in the home. Will complete provided nutritional and psychosocial assessment questionnaire before the next appointment. Will be scheduled for indirect calorimetry to determine resting energy expenditure in a fasting state.  This will allow [place] to create a reduced calorie, high-protein meal plan to promote loss of fat mass while preserving muscle mass.  Obesity Education Performed Today:  He was weighed on the bioimpedance scale and results were discussed and documented in the synopsis.  We discussed obesity as a disease and the importance of a more detailed evaluation of all the factors contributing to the disease.  We discussed the importance of long term lifestyle changes which include nutrition, exercise and behavioral  modifications as well as the importance of customizing this to his specific health and social needs.  We discussed the benefits of reaching a healthier weight to alleviate the symptoms of existing conditions and reduce the risks of the biomechanical, metabolic and psychological effects of obesity.  Jorge Boyle appears to be in the action stage of change and states they are ready to start intensive lifestyle modifications and behavioral modifications.   Reviewed by clinician on day of visit: allergies, medications, problem list, medical history, surgical history, family history, social history, and previous encounter notes.  I have personally spent 35 minutes total time today in preparation, patient care, and documentation for this visit, including the following: review of clinical lab tests; review of medical tests/procedures/services.   I, Trixie Dredge, am acting as transcriptionist for Dennard Nip, MD   I have reviewed the above documentation for accuracy and completeness, and I agree with the above. Dennard Nip, MD

## 2023-01-13 ENCOUNTER — Encounter (INDEPENDENT_AMBULATORY_CARE_PROVIDER_SITE_OTHER): Payer: 59 | Admitting: Family Medicine

## 2023-01-22 ENCOUNTER — Other Ambulatory Visit: Payer: Self-pay | Admitting: Family Medicine

## 2023-02-13 ENCOUNTER — Encounter: Payer: Self-pay | Admitting: Family Medicine

## 2023-02-13 DIAGNOSIS — E1140 Type 2 diabetes mellitus with diabetic neuropathy, unspecified: Secondary | ICD-10-CM

## 2023-02-13 MED ORDER — CONTOUR NEXT ONE KIT: PACK | 0 refills | Status: AC

## 2023-02-13 NOTE — Telephone Encounter (Signed)
E-scribed Contour Next One meter kit, per pt request, to [facility].

## 2023-02-18 ENCOUNTER — Other Ambulatory Visit: Payer: Self-pay | Admitting: Family Medicine

## 2023-02-18 MED ORDER — FLUTICASONE PROPIONATE 50 MCG/ACT NA SUSP: 2.0000 | Freq: Every day | NASAL | 3 refills | Status: DC

## 2023-03-03 ENCOUNTER — Encounter: Payer: Self-pay | Admitting: Family Medicine

## 2023-03-03 ENCOUNTER — Ambulatory Visit: Payer: BC Managed Care – PPO | Admitting: Family Medicine

## 2023-03-03 VITALS — BP 126/84 | HR 95 | Temp 97.4°F | Ht 70.0 in | Wt 289.4 lb

## 2023-03-03 DIAGNOSIS — E1140 Type 2 diabetes mellitus with diabetic neuropathy, unspecified: Secondary | ICD-10-CM | POA: Diagnosis not present

## 2023-03-03 DIAGNOSIS — M79672 Pain in left foot: Secondary | ICD-10-CM | POA: Insufficient documentation

## 2023-03-03 DIAGNOSIS — Z6841 Body Mass Index (BMI) 40.0 and over, adult: Secondary | ICD-10-CM

## 2023-03-03 DIAGNOSIS — J029 Acute pharyngitis, unspecified: Secondary | ICD-10-CM | POA: Diagnosis not present

## 2023-03-03 LAB — POCT GLYCOSYLATED HEMOGLOBIN (HGB A1C): Hemoglobin A1C: 6.9 % — AB (ref 4.0–5.6)

## 2023-03-03 MED ORDER — CETIRIZINE HCL 10 MG PO TABS: 10.0000 mg | ORAL_TABLET | Freq: Every day | ORAL | 6 refills | Status: DC

## 2023-03-03 NOTE — Patient Instructions (Addendum)
We will refer you to [facility] foot doctor (near work) [place] office).  Call and schedule eye exam at [facility].  Congrats on sugar control! Continue current regimen. Return in 3 months for follow up visit.  Continue flonase, add over the counter zyrtec at night - this was sent to your pharmacy.

## 2023-03-03 NOTE — Progress Notes (Signed)
Ph: [phone] Fax: [phone]   Patient ID: Jorge Boyle, male    DOB: [birth year], 47 y.o.   MRN: [id number]  This visit was conducted in person.  BP 126/84   Pulse 95   Temp (!) 97.4 F (36.3 C) (Temporal)   Ht 5\' 10"  (1.778 m)   Wt 289 lb 6 oz (131.3 kg)   SpO2 93%   BMI 41.52 kg/m    CC: 3 mo f/u visit  Subjective:   HPI: Jorge Boyle is a 47 y.o. male presenting on 03/03/2023 for Medical Management of Chronic Issues (Here for 3 mo f/u.)   11 lb weight loss in the past 3 months.  He has established with healthy [facility] Dr Dalbert Garnet.   H/o marked hyperglycemia after steroid course with A1c up to 11.7.   Dealing with L foot plantar fasciitis. He saw [facility] with steroid shot with only limited relief (3d).   DM - does regularly check sugars fasting this morning 119, normally 90-130s fasting. Compliant with antihyperglycemic regimen which includes: ozempic 2mg  weekly, metformin 1000mg  bid, actos 15mg  daily. Tolerating ozempic well without nausea, constipation or diarrhea or epigastric pain. Denies low sugars or hypoglycemic symptoms. Denies paresthesias, blurry vision. Last diabetic eye exam DUE. Glucometer brand: contour next bluetooth. Last foot exam: DUE. DSME: has declined. Lab Results  Component Value Date   HGBA1C 6.9 (A) 03/03/2023   Diabetic Foot Exam - Simple   Simple Foot Form Diabetic Foot exam was performed with the following findings: Yes 03/03/2023  4:02 PM  Visual Inspection No deformities, no ulcerations, no other skin breakdown bilaterally: Yes Sensation Testing Intact to touch and monofilament testing bilaterally: Yes Pulse Check Posterior Tibialis and Dorsalis pulse intact bilaterally: Yes Comments 2+ DP bilaterally    Lab Results  Component Value Date   MICROALBUR <0.7 12/02/2022         Relevant past medical, surgical, family and social history reviewed and updated as indicated. Interim medical history  since our last visit reviewed. Allergies and medications reviewed and updated. Outpatient Medications Prior to Visit  Medication Sig Dispense Refill   atorvastatin (LIPITOR) 20 MG tablet Take 1 tablet (20 mg total) by mouth daily. 90 tablet 4   Blood Glucose Monitoring Suppl (CONTOUR NEXT ONE) KIT Use as instructed to check blood sugar once a day 1 kit 0   colchicine 0.6 MG tablet TAKE 1 TABLET (0.6 MG TOTAL) BY MOUTH DAILY AS NEEDED (GOUT FLARE). MAY TAKE 2 TABLETS ON FIRST DAY OF USE 30 tablet 3   cyclobenzaprine (FLEXERIL) 10 MG tablet Take 0.5-1 tablets (5-10 mg total) by mouth 2 (two) times daily as needed for muscle spasms (sedation precautions). 30 tablet 1   fluticasone (FLONASE) 50 MCG/ACT nasal spray Place 2 sprays into both nostrils daily. 48 g 3   glucose blood (CONTOUR NEXT TEST) test strip Use as instructed to check blood sugar once daily. 100 each 3   hydrOXYzine (ATARAX) 25 MG tablet TAKE 0.5-1 TABLETS BY MOUTH 2 TIMES DAILY AS NEEDED FOR ANXIETY (SEDATION PRECAUTIONS). 90 tablet 1   levothyroxine (SYNTHROID) 175 MCG tablet Take 1 tablet (175 mcg total) by mouth daily before breakfast. 90 tablet 4   losartan (COZAAR) 100 MG tablet Take 1 tablet (100 mg total) by mouth daily. 90 tablet 4   metFORMIN (GLUCOPHAGE) 1000 MG tablet Take 1 tablet (1,000 mg total) by mouth 2 (two) times daily with a meal. 180 tablet 4   Microlet Lancets  MISC Use as directed to check blood sugars once daily. 100 each 3   naproxen (NAPROSYN) 500 MG tablet TAKE 1 TABLET BY MOUTH TWICE A DAY FOR 1 WEEK THEN AS NEEDED FOR PAIN TAKE WITH FOOD 50 tablet 0   pioglitazone (ACTOS) 15 MG tablet Take 1 tablet (15 mg total) by mouth daily. 90 tablet 4   Semaglutide, 2 MG/DOSE, 8 MG/3ML SOPN Inject 2 mg as directed once a week. 9 mL 4   No facility-administered medications prior to visit.     Per HPI unless specifically indicated in ROS section below Review of Systems  Objective:  BP 126/84   Pulse 95   Temp  (!) 97.4 F (36.3 C) (Temporal)   Ht 5\' 10"  (1.778 m)   Wt 289 lb 6 oz (131.3 kg)   SpO2 93%   BMI 41.52 kg/m   Wt Readings from Last 3 Encounters:  03/03/23 289 lb 6 oz (131.3 kg)  12/23/22 298 lb (135.2 kg)  12/02/22 (!) 301 lb 4 oz (136.6 kg)      Physical Exam Vitals and nursing note reviewed.  Constitutional:      Appearance: Normal appearance. He is not ill-appearing.  Eyes:     Extraocular Movements: Extraocular movements intact.     Conjunctiva/sclera: Conjunctivae normal.     Pupils: Pupils are equal, round, and reactive to light.  Cardiovascular:     Rate and Rhythm: Normal rate and regular rhythm.     Pulses: Normal pulses.     Heart sounds: Normal heart sounds. No murmur heard. Pulmonary:     Effort: Pulmonary effort is normal. No respiratory distress.     Breath sounds: Normal breath sounds. No wheezing, rhonchi or rales.  Musculoskeletal:     Right lower leg: No edema.     Left lower leg: No edema.     Comments: See HPI for foot exam if done  Skin:    General: Skin is warm and dry.     Findings: No rash.  Neurological:     Mental Status: He is alert.  Psychiatric:        Mood and Affect: Mood normal.        Behavior: Behavior normal.       Results for orders placed or performed in visit on 03/03/23  POCT glycosylated hemoglobin (Hb A1C)  Result Value Ref Range   Hemoglobin A1C 6.9 (A) 4.0 - 5.6 %   HbA1c POC (<> result, manual entry)     HbA1c, POC (prediabetic range)     HbA1c, POC (controlled diabetic range)     Lab Results  Component Value Date   TSH 2.59 12/02/2022    Assessment & Plan:   Problem List Items Addressed This Visit     Obesity, morbid, BMI 40.0-49.9 (HCC)    Congratulated on noted weight loss. Continue ozempic.       Type 2 diabetes mellitus with diabetic neuropathy, unspecified (HCC) - Primary    Chronic, marked improved control on Ozempic with noted weight loss - congratulated.  Continue current regimen, return in 3  months DM f/u visit       Relevant Orders   POCT glycosylated hemoglobin (Hb A1C) (Completed)   Ambulatory referral to Podiatry   Sore throat    Chronic, attributes to increased PNDrainage despite flonase.  Notes ongoing am sinus congestion in the morning.  Will add zyrtec 10mg  nightly.       Left foot pain    Recently saw  ortho for L foot pain presumed plantar fasciitis s/p steroid injection with limited relief.  Would like to see [facility] podiatry for evaluation. Referral placed      Relevant Orders   Ambulatory referral to Podiatry     Meds ordered this encounter  Medications   cetirizine (ZYRTEC) 10 MG tablet    Sig: Take 1 tablet (10 mg total) by mouth daily.    Dispense:  30 tablet    Refill:  6    Orders Placed This Encounter  Procedures   Ambulatory referral to Podiatry    Referral Priority:   Routine    Referral Type:   Consultation    Referral Reason:   Specialty Services Required    Requested Specialty:   Podiatry    Number of Visits Requested:   1   POCT glycosylated hemoglobin (Hb A1C)    Patient Instructions  We will refer you to [facility] foot doctor (near work) [place] office).  Call and schedule eye exam at [facility].  Congrats on sugar control! Continue current regimen. Return in 3 months for follow up visit.  Continue flonase, add over the counter zyrtec at night - this was sent to your pharmacy.   Follow up plan: Return in about 3 months (around 06/03/2023) for follow up visit.  Eustaquio Boyden, MD

## 2023-03-03 NOTE — Assessment & Plan Note (Signed)
Chronic, attributes to increased PNDrainage despite flonase.  Notes ongoing am sinus congestion in the morning.  Will add zyrtec 10mg  nightly.

## 2023-03-03 NOTE — Assessment & Plan Note (Addendum)
Congratulated on noted weight loss. Continue ozempic.

## 2023-03-03 NOTE — Assessment & Plan Note (Signed)
Chronic, marked improved control on Ozempic with noted weight loss - congratulated.  Continue current regimen, return in 3 months DM f/u visit

## 2023-03-03 NOTE — Assessment & Plan Note (Signed)
Recently saw ortho for L foot pain presumed plantar fasciitis s/p steroid injection with limited relief.  Would like to see [facility] podiatry for evaluation. Referral placed

## 2023-03-06 ENCOUNTER — Telehealth: Payer: Self-pay | Admitting: Family Medicine

## 2023-03-06 NOTE — Telephone Encounter (Signed)
[facility] called stating they received referral for patient .However they are needing recent office notes regarding his left foot pain.   Fax#: [phone]

## 2023-03-06 NOTE — Telephone Encounter (Signed)
Referral and notes resent

## 2023-03-25 ENCOUNTER — Ambulatory Visit: Payer: BC Managed Care – PPO | Admitting: Podiatry

## 2023-03-25 ENCOUNTER — Ambulatory Visit (INDEPENDENT_AMBULATORY_CARE_PROVIDER_SITE_OTHER): Payer: BC Managed Care – PPO

## 2023-03-25 ENCOUNTER — Encounter: Payer: Self-pay | Admitting: Podiatry

## 2023-03-25 VITALS — BP 144/88 | HR 80

## 2023-03-25 DIAGNOSIS — M722 Plantar fascial fibromatosis: Secondary | ICD-10-CM

## 2023-03-25 MED ORDER — MELOXICAM 15 MG PO TABS: 15.0000 mg | ORAL_TABLET | Freq: Every day | ORAL | 1 refills | Status: DC

## 2023-03-25 MED ORDER — METHYLPREDNISOLONE 4 MG PO TBPK: ORAL_TABLET | ORAL | 0 refills | Status: DC

## 2023-03-25 MED ORDER — BETAMETHASONE SOD PHOS & ACET 6 (3-3) MG/ML IJ SUSP
3.0000 mg | Freq: Once | INTRAMUSCULAR | Status: AC
Start: 2023-03-25 — End: 2023-03-25
  Administered 2023-03-25: 3 mg via INTRA_ARTICULAR

## 2023-03-25 NOTE — Progress Notes (Signed)
   Chief Complaint  Patient presents with   Foot Pain    "I have Plantar Fasciitis." N - heel pain and arch pain L - left D - 6-8 mos O - gradually C - sharp pain A - walking, steel toe boots T - [facility] - gave me a shot,     Subjective: 47 y.o. male presenting today as a reestablish new patient for evaluation of left heel pain has been ongoing for about a year.  He has been to the [facility] a few times and treatment has been somewhat beneficial but he continues to have heel pain.  He has received cortisone injections.  Last injection about 6 months ago.  Presenting for further treatment evaluation   Past Medical History:  Diagnosis Date   Allergic rhinitis    Diabetes mellitus without complication (HCC)    Difficult intubation    PT DOES NOT REMEMBER THIS   Headache    History of chicken pox    History of kidney stones    Hypertension    Multiple thyroid nodules 01/2012   left, biopsy x2 benign, rpt [place] stable (01/2013, 07/2015) rec rpt 1 yr   Nontoxic nodular goiter    Obesity    Prediabetes    S/P excision of thyroid adenoma 2005   R thyroidectomy     Objective: Physical Exam General: The patient is alert and oriented x3 in no acute distress.  Dermatology: Skin is warm, dry and supple bilateral lower extremities. Negative for open lesions or macerations bilateral.   Vascular: Dorsalis Pedis and Posterior Tibial pulses palpable bilateral.  Capillary fill time is immediate to all digits.  Neurological: Epicritic and protective threshold intact bilateral.   Musculoskeletal: Tenderness to palpation to the plantar aspect of the left heel along the plantar fascia. All other joints range of motion within normal limits bilateral. Strength 5/5 in all groups bilateral.   Radiographic exam LT foot 03/25/2023: Normal osseous mineralization. Joint spaces preserved. No fracture/dislocation/boney destruction. No other soft tissue abnormalities or radiopaque foreign bodies.   Plantar and posterior heel spur noted on lateral view  Assessment: 1. Plantar fasciitis left foot  Plan of Care:  1. Patient evaluated. Xrays reviewed.   2. Injection of 0.5cc Celestone soluspan injected into the left plantar fascia.  3. Rx for Medrol Dose Pak placed 4. Rx for Meloxicam ordered for patient. 5.  Prefabricated OTC power step insoles were dispensed today.  Wear daily in his work boots 6. Instructed patient regarding therapies and modalities at home to alleviate symptoms.  7. Return to clinic as needed.  Patient states that he works second shift and he is unable to schedule follow-up at the moment.  He will call if he needs any additional treatment  *Forklift driver   Felecia Shelling, DPM [facility]  Dr. Felecia Shelling, DPM    [address] [place], [place] [address]                Office [phone]  Fax [phone]

## 2023-03-28 ENCOUNTER — Telehealth: Payer: Self-pay | Admitting: Podiatry

## 2023-03-28 NOTE — Telephone Encounter (Signed)
Patient called, stating he looked in his chart and saw the x-rays weren't reviewed yet.  He was in on Tuesday and states he had x-rays performed.    Saw the x-ray findings in the chart note and called patient to review, but no answer.  Did not leave the results on his voicemail.

## 2023-04-01 ENCOUNTER — Ambulatory Visit: Payer: BC Managed Care – PPO | Admitting: Dermatology

## 2023-05-21 ENCOUNTER — Encounter (INDEPENDENT_AMBULATORY_CARE_PROVIDER_SITE_OTHER): Payer: Self-pay

## 2023-06-03 ENCOUNTER — Encounter: Payer: Self-pay | Admitting: Family Medicine

## 2023-06-03 ENCOUNTER — Ambulatory Visit: Payer: BC Managed Care – PPO | Admitting: Family Medicine

## 2023-06-03 VITALS — BP 130/78 | HR 83 | Temp 97.4°F | Ht 70.0 in | Wt 290.5 lb

## 2023-06-03 DIAGNOSIS — E1140 Type 2 diabetes mellitus with diabetic neuropathy, unspecified: Secondary | ICD-10-CM

## 2023-06-03 DIAGNOSIS — Z7984 Long term (current) use of oral hypoglycemic drugs: Secondary | ICD-10-CM | POA: Diagnosis not present

## 2023-06-03 DIAGNOSIS — Z7985 Long-term (current) use of injectable non-insulin antidiabetic drugs: Secondary | ICD-10-CM | POA: Diagnosis not present

## 2023-06-03 DIAGNOSIS — E039 Hypothyroidism, unspecified: Secondary | ICD-10-CM

## 2023-06-03 DIAGNOSIS — R0981 Nasal congestion: Secondary | ICD-10-CM

## 2023-06-03 LAB — POCT GLYCOSYLATED HEMOGLOBIN (HGB A1C): Hemoglobin A1C: 6.6 % — AB (ref 4.0–5.6)

## 2023-06-03 NOTE — Progress Notes (Signed)
Ph: [phone] Fax: [phone]   Patient ID: Delane Ginger, male    DOB: 09-Aug-1976, 47 y.o.   MRN: [id number]  This visit was conducted in person.  BP 130/78   Pulse 83   Temp (!) 97.4 F (36.3 C) (Temporal)   Ht 5\' 10"  (1.778 m)   Wt 290 lb 8 oz (131.8 kg)   SpO2 97%   BMI 41.68 kg/m    CC: 3 mo DM f/u visit  Subjective:   HPI: JONMARC KOLKMAN is a 47 y.o. male presenting on 06/03/2023 for Medical Management of Chronic Issues (Here for 3 mo DM f/u. Also, wants to discuss other allergy med options, Zyrtec and Flonase not working. )   DM - does not regularly check sugars - overall well controlled. Compliant with antihyperglycemic regimen which includes: metformin 1000mg  bid, actos 15mg  daily, ozempic 2mg  weekly. Tolerating well without side effects. Denies low sugars or hypoglycemic symptoms. Denies paresthesias, blurry vision. Last diabetic eye exam DUE. Glucometer brand: contour next. Last foot exam: 02/2023. DSME: declined. Lab Results  Component Value Date   HGBA1C 6.6 (A) 06/03/2023   Diabetic Foot Exam - Simple   Simple Foot Form Diabetic Foot exam was performed with the following findings: Yes 06/03/2023  3:55 PM  Visual Inspection No deformities, no ulcerations, no other skin breakdown bilaterally: Yes Sensation Testing Intact to touch and monofilament testing bilaterally: Yes Pulse Check Posterior Tibialis and Dorsalis pulse intact bilaterally: Yes Comments No claudication 2+ DP bilaterally    Lab Results  Component Value Date   MICROALBUR <0.7 12/02/2022     Notes ongoing sinus congestion and post nasal drainage despite regular flonase and zyrtec daily.  Especially bad after mowing lawn.  Has dogs at home - boxer, mutt.      Relevant past medical, surgical, family and social history reviewed and updated as indicated. Interim medical history since our last visit reviewed. Allergies and medications reviewed and updated. Outpatient Medications  Prior to Visit  Medication Sig Dispense Refill   atorvastatin (LIPITOR) 20 MG tablet Take 1 tablet (20 mg total) by mouth daily. 90 tablet 4   Blood Glucose Monitoring Suppl (CONTOUR NEXT ONE) KIT Use as instructed to check blood sugar once a day 1 kit 0   colchicine 0.6 MG tablet TAKE 1 TABLET (0.6 MG TOTAL) BY MOUTH DAILY AS NEEDED (GOUT FLARE). MAY TAKE 2 TABLETS ON FIRST DAY OF USE 30 tablet 3   cyclobenzaprine (FLEXERIL) 10 MG tablet Take 0.5-1 tablets (5-10 mg total) by mouth 2 (two) times daily as needed for muscle spasms (sedation precautions). 30 tablet 1   fluticasone (FLONASE) 50 MCG/ACT nasal spray Place 2 sprays into both nostrils daily. 48 g 3   glucose blood (CONTOUR NEXT TEST) test strip Use as instructed to check blood sugar once daily. 100 each 3   hydrOXYzine (ATARAX) 25 MG tablet TAKE 0.5-1 TABLETS BY MOUTH 2 TIMES DAILY AS NEEDED FOR ANXIETY (SEDATION PRECAUTIONS). 90 tablet 1   levothyroxine (SYNTHROID) 175 MCG tablet Take 1 tablet (175 mcg total) by mouth daily before breakfast. 90 tablet 4   losartan (COZAAR) 100 MG tablet Take 1 tablet (100 mg total) by mouth daily. 90 tablet 4   meloxicam (MOBIC) 15 MG tablet Take 1 tablet (15 mg total) by mouth daily. 60 tablet 1   metFORMIN (GLUCOPHAGE) 1000 MG tablet Take 1 tablet (1,000 mg total) by mouth 2 (two) times daily with a meal. 180 tablet 4   Microlet  Lancets MISC Use as directed to check blood sugars once daily. 100 each 3   naproxen (NAPROSYN) 500 MG tablet TAKE 1 TABLET BY MOUTH TWICE A DAY FOR 1 WEEK THEN AS NEEDED FOR PAIN TAKE WITH FOOD 50 tablet 0   Semaglutide, 2 MG/DOSE, 8 MG/3ML SOPN Inject 2 mg as directed once a week. 9 mL 4   cetirizine (ZYRTEC) 10 MG tablet Take 1 tablet (10 mg total) by mouth daily. 30 tablet 6   methylPREDNISolone (MEDROL DOSEPAK) 4 MG TBPK tablet 6 day dose pack - take as directed 21 tablet 0   pioglitazone (ACTOS) 15 MG tablet Take 1 tablet (15 mg total) by mouth daily. 90 tablet 4   No  facility-administered medications prior to visit.     Per HPI unless specifically indicated in ROS section below Review of Systems  Objective:  BP 130/78   Pulse 83   Temp (!) 97.4 F (36.3 C) (Temporal)   Ht 5\' 10"  (1.778 m)   Wt 290 lb 8 oz (131.8 kg)   SpO2 97%   BMI 41.68 kg/m   Wt Readings from Last 3 Encounters:  06/03/23 290 lb 8 oz (131.8 kg)  03/03/23 289 lb 6 oz (131.3 kg)  12/23/22 298 lb (135.2 kg)      Physical Exam Vitals and nursing note reviewed.  Constitutional:      Appearance: Normal appearance. He is not ill-appearing.  HENT:     Head: Normocephalic and atraumatic.     Nose: Nose normal. No congestion or rhinorrhea.     Right Turbinates: Not enlarged, swollen or pale.     Left Turbinates: Not enlarged, swollen or pale.     Right Sinus: No maxillary sinus tenderness or frontal sinus tenderness.     Left Sinus: No maxillary sinus tenderness or frontal sinus tenderness.     Mouth/Throat:     Mouth: Mucous membranes are moist.     Pharynx: Oropharynx is clear. No oropharyngeal exudate or posterior oropharyngeal erythema.  Eyes:     Extraocular Movements: Extraocular movements intact.     Conjunctiva/sclera: Conjunctivae normal.     Pupils: Pupils are equal, round, and reactive to light.  Cardiovascular:     Rate and Rhythm: Normal rate and regular rhythm.     Pulses: Normal pulses.     Heart sounds: Normal heart sounds. No murmur heard. Pulmonary:     Effort: Pulmonary effort is normal. No respiratory distress.     Breath sounds: Normal breath sounds. No wheezing, rhonchi or rales.  Musculoskeletal:     Right lower leg: No edema.     Left lower leg: No edema.     Comments: See HPI for foot exam if done  Skin:    General: Skin is warm and dry.     Findings: No rash.  Neurological:     Mental Status: He is alert.  Psychiatric:        Mood and Affect: Mood normal.        Behavior: Behavior normal.       Results for orders placed or performed  in visit on 06/03/23  POCT glycosylated hemoglobin (Hb A1C)  Result Value Ref Range   Hemoglobin A1C 6.6 (A) 4.0 - 5.6 %   HbA1c POC (<> result, manual entry)     HbA1c, POC (prediabetic range)     HbA1c, POC (controlled diabetic range)     Lab Results  Component Value Date   TSH 2.59 12/02/2022  Lab Results  Component Value Date   NA 141 12/02/2022   CL 102 12/02/2022   K 4.2 12/02/2022   CO2 29 12/02/2022   BUN 11 12/02/2022   CREATININE 0.86 12/02/2022   GFR 103.35 12/02/2022   CALCIUM 9.2 12/02/2022   PHOS 3.5 02/17/2006   ALBUMIN 4.1 12/02/2022   GLUCOSE 106 (H) 12/02/2022    Lab Results  Component Value Date   ALT 25 12/02/2022   AST 16 12/02/2022   GGT 67 02/17/2006   ALKPHOS 53 12/02/2022   BILITOT 0.3 12/02/2022   Assessment & Plan:   Problem List Items Addressed This Visit     Type 2 diabetes mellitus with diabetic neuropathy, unspecified (HCC) - Primary    Chronic, stable on current regimen.  Tolerating ozempic 2mg  weekly and full dose metformin will. Will stop actos, reassess at CPE.       Relevant Orders   POCT glycosylated hemoglobin (Hb A1C) (Completed)   Chronic nasal congestion    Chronic nasal congestion despite regular flonase and zyrtec use.  Discussed trial of different OTC antihistamine such as allegra, xyzal.       Acquired hypothyroidism    Continues levothyroxine daily.  Latest TSH normal.  Notes ongoing difficulty losing weight.         No orders of the defined types were placed in this encounter.   Orders Placed This Encounter  Procedures   POCT glycosylated hemoglobin (Hb A1C)    Patient Instructions  Stop actos.  Continue metformin and ozempic.  Trial claritin, allegra, or xyzal in place of zyrtec. Continue flonase nasal steroid.  I'll send your info to our diabetes eye screening scheduler - or call to schedule eye exam at [facility].  Return in 6 months for physical.   Follow up plan: Return in about 6  months (around 12/04/2023) for annual exam, prior fasting for blood work.  Eustaquio Boyden, MD

## 2023-06-03 NOTE — Patient Instructions (Addendum)
Stop actos.  Continue metformin and ozempic.  Trial claritin, allegra, or xyzal in place of zyrtec. Continue flonase nasal steroid.  I'll send your info to our diabetes eye screening scheduler - or call to schedule eye exam at [facility].  Return in 6 months for physical.

## 2023-06-03 NOTE — Assessment & Plan Note (Signed)
Continues levothyroxine daily.  Latest TSH normal.  Notes ongoing difficulty losing weight.

## 2023-06-03 NOTE — Assessment & Plan Note (Signed)
Chronic, stable on current regimen.  Tolerating ozempic 2mg  weekly and full dose metformin will. Will stop actos, reassess at CPE.

## 2023-06-03 NOTE — Assessment & Plan Note (Addendum)
Chronic nasal congestion despite regular flonase and zyrtec use. Suspect perennial allergies. Rec allergen avoidance measures.  Discussed trial of different OTC antihistamine such as allegra, xyzal.

## 2023-06-24 ENCOUNTER — Telehealth: Payer: Self-pay | Admitting: Family Medicine

## 2023-06-24 DIAGNOSIS — E039 Hypothyroidism, unspecified: Secondary | ICD-10-CM

## 2023-06-24 NOTE — Telephone Encounter (Signed)
Manny from [employer] called over and stated that the manufacturer for levothyroxine (SYNTHROID) 175 MCG tablet is changing and they are needing an ok. Reference number is [id number]. Thank you!

## 2023-06-25 NOTE — Telephone Encounter (Signed)
Please notify patient and if okayed by him okay to change manufacturer. Please schedule lab visit 2 months after starting new pills to recheck thyroid function.

## 2023-06-25 NOTE — Addendum Note (Signed)
Addended by: Eustaquio Boyden on: 06/25/2023 07:24 AM   Modules accepted: Orders

## 2023-06-26 NOTE — Telephone Encounter (Signed)
Lvm asking pt to call back. Need to talk with pt concerning levothyroxine manufacturer change.

## 2023-06-27 NOTE — Telephone Encounter (Signed)
Called patient gave information to patient. Lab appointment made and order is in chart.  Called pharmacy and advised ok to change.   No further action needed at this time.

## 2023-07-07 ENCOUNTER — Emergency Department: Payer: BC Managed Care – PPO

## 2023-07-07 ENCOUNTER — Emergency Department
Admission: EM | Admit: 2023-07-07 | Discharge: 2023-07-07 | Disposition: A | Discharge status: Home / Self Care | Payer: BC Managed Care – PPO | Attending: Emergency Medicine | Admitting: Emergency Medicine

## 2023-07-07 DIAGNOSIS — K5792 Diverticulitis of intestine, part unspecified, without perforation or abscess without bleeding: Secondary | ICD-10-CM | POA: Diagnosis not present

## 2023-07-07 DIAGNOSIS — R8289 Other abnormal findings on cytological and histological examination of urine: Secondary | ICD-10-CM | POA: Insufficient documentation

## 2023-07-07 DIAGNOSIS — E119 Type 2 diabetes mellitus without complications: Secondary | ICD-10-CM | POA: Diagnosis not present

## 2023-07-07 DIAGNOSIS — K5730 Diverticulosis of large intestine without perforation or abscess without bleeding: Secondary | ICD-10-CM | POA: Diagnosis not present

## 2023-07-07 DIAGNOSIS — R109 Unspecified abdominal pain: Secondary | ICD-10-CM | POA: Diagnosis not present

## 2023-07-07 LAB — CBC
HCT: 42.9 % (ref 39.0–52.0)
Hemoglobin: 14.2 g/dL (ref 13.0–17.0)
MCH: 29.8 pg (ref 26.0–34.0)
MCHC: 33.1 g/dL (ref 30.0–36.0)
MCV: 90.1 fL (ref 80.0–100.0)
Platelets: 316 10*3/uL (ref 150–400)
RBC: 4.76 MIL/uL (ref 4.22–5.81)
RDW: 13.2 % (ref 11.5–15.5)
WBC: 12.4 10*3/uL — ABNORMAL HIGH (ref 4.0–10.5)
nRBC: 0 % (ref 0.0–0.2)

## 2023-07-07 LAB — COMPREHENSIVE METABOLIC PANEL
ALT: 35 U/L (ref 0–44)
AST: 21 U/L (ref 15–41)
Albumin: 4.1 g/dL (ref 3.5–5.0)
Alkaline Phosphatase: 66 U/L (ref 38–126)
Anion gap: 11 (ref 5–15)
BUN: 13 mg/dL (ref 6–20)
CO2: 25 mmol/L (ref 22–32)
Calcium: 8.7 mg/dL — ABNORMAL LOW (ref 8.9–10.3)
Chloride: 103 mmol/L (ref 98–111)
Creatinine, Ser: 0.83 mg/dL (ref 0.61–1.24)
GFR, Estimated: >60 mL/min (ref >60)
Glucose, Bld: 122 mg/dL — ABNORMAL HIGH (ref 70–99)
Potassium: 3.7 mmol/L (ref 3.5–5.1)
Sodium: 139 mmol/L (ref 135–145)
Total Bilirubin: 0.8 mg/dL (ref 0.3–1.2)
Total Protein: 7.9 g/dL (ref 6.5–8.1)

## 2023-07-07 LAB — URINALYSIS, ROUTINE W REFLEX MICROSCOPIC
Bilirubin Urine: NEGATIVE
Glucose, UA: NEGATIVE mg/dL
Hgb urine dipstick: NEGATIVE
Ketones, ur: NEGATIVE mg/dL
Leukocytes,Ua: NEGATIVE
Nitrite: NEGATIVE
Protein, ur: NEGATIVE mg/dL
Specific Gravity, Urine: 1.014 (ref 1.005–1.030)
pH: 6 (ref 5.0–8.0)

## 2023-07-07 LAB — LIPASE, BLOOD: Lipase: 26 U/L (ref 11–51)

## 2023-07-07 MED ORDER — CIPROFLOXACIN HCL 500 MG PO TABS: 500.0000 mg | ORAL_TABLET | Freq: Two times a day (BID) | ORAL | 0 refills | Status: AC

## 2023-07-07 MED ORDER — METRONIDAZOLE 500 MG PO TABS: 500.0000 mg | ORAL_TABLET | Freq: Three times a day (TID) | ORAL | 0 refills | Status: AC

## 2023-07-07 MED ORDER — METRONIDAZOLE 500 MG PO TABS
500.0000 mg | ORAL_TABLET | Freq: Once | ORAL | Status: AC
  Administered 2023-07-07: 500 mg via ORAL
  Filled 2023-07-07: qty 1

## 2023-07-07 MED ORDER — CIPROFLOXACIN HCL 500 MG PO TABS
500.0000 mg | ORAL_TABLET | Freq: Once | ORAL | Status: AC
  Administered 2023-07-07: 500 mg via ORAL
  Filled 2023-07-07: qty 1

## 2023-07-07 NOTE — ED Triage Notes (Signed)
Pt presents to the ED POV from home. Pt reports RUQ abdominal pain that started a week ago. Pt reports this to be a throbbing pain. Denies N/V/D.

## 2023-07-07 NOTE — ED Notes (Signed)
Pt was informed that we still need urine sample

## 2023-07-07 NOTE — ED Provider Notes (Signed)
[facility] Provider Note    Event Date/Time   First MD Initiated Contact with Patient 07/07/23 1929     (approximate)  History   Chief Complaint: Abdominal Pain  HPI  Jorge Boyle is a 47 y.o. male with a past medical history of kidney stones, diabetes, obesity, presents to the emergency department for intermittent right-sided flank pain.  According to the patient for the past 1 week he has intermittently been experiencing pain to the right side of his abdomen/flank.  Patient denies any fever denies any nausea vomiting diarrhea or constipation.  No urinary symptoms such as dysuria or hematuria.  Patient does state a history of kidney stones in the past but does not feel similar.  Physical Exam   Triage Vital Signs: ED Triage Vitals  Encounter Vitals Group     BP 07/07/23 1838 (!) 142/99     Systolic BP Percentile --      Diastolic BP Percentile --      Pulse Rate 07/07/23 1838 98     Resp 07/07/23 1838 18     Temp 07/07/23 1838 98.9 F (37.2 C)     Temp Source 07/07/23 1838 Oral     SpO2 07/07/23 1838 98 %     Weight 07/07/23 1839 290 lb (131.5 kg)     Height 07/07/23 1839 5\' 10"  (1.778 m)     Head Circumference --      Peak Flow --      Pain Score 07/07/23 1839 5     Pain Loc --      Pain Education --      Exclude from Growth Chart --     Most recent vital signs: Vitals:   07/07/23 1838  BP: (!) 142/99  Pulse: 98  Resp: 18  Temp: 98.9 F (37.2 C)  SpO2: 98%    General: Awake, no distress.  CV:  Good peripheral perfusion.  Regular rate and rhythm  Resp:  Normal effort.  Equal breath sounds bilaterally.  Abd:  No distention.  Soft, minimal right lower quadrant tenderness, minimal right mid abdominal tenderness.  No rebound or guarding.   ED Results / Procedures / Treatments   RADIOLOGY  I have reviewed and interpreted CT images.  No significant abnormality seen on my evaluation. Radiology is read a small area of  diverticulitis along the sigmoid colon.  MEDICATIONS ORDERED IN ED: Medications - No data to display   IMPRESSION / MDM / ASSESSMENT AND PLAN / ED COURSE  I reviewed the triage vital signs and the nursing notes.  Patient's presentation is most consistent with acute presentation with potential threat to life or bodily function.  Patient presents emergency department for right-sided abdominal discomfort.  Overall the patient appears well, no distress.  Patient's lab work is reassuring mild leukocytosis on his CBC, reassuring chemistry including LFTs normal lipase reassuring creatinine.  Given the patient's history of kidney stones, urinalysis is pending we will obtain a CT renal scan to help evaluate for ureterolithiasis but also evaluate for possible appendicitis although not likely based on physical exam.  Will continue close monitor while awaiting results.  Patient agreeable to plan of care.  CT consistent with diverticulitis.  The remainder of the workup is reassuring normal urinalysis reassuring chemistry slight leukocytosis otherwise reassuring CBC, normal lipase.  Will discharge on Augmentin have the patient follow-up with his doctor.  Patient agreeable to plan of care.  Provided my typical diverticulitis return precautions.  FINAL CLINICAL  IMPRESSION(S) / ED DIAGNOSES   Diverticulitis  Note:  This document was prepared using Dragon voice recognition software and may include unintentional dictation errors.   Minna Antis, MD 07/07/23 2137

## 2023-07-25 ENCOUNTER — Other Ambulatory Visit: Payer: Self-pay | Admitting: Podiatry

## 2023-08-08 ENCOUNTER — Telehealth: Payer: Self-pay

## 2023-08-08 NOTE — Telephone Encounter (Signed)
Transition Care Management Follow-up Telephone Call Date of discharge and from where: 07/07/2023 [facility] How have you been since you were released from the hospital? Patient stated he is feeling much better, no further issues. Any questions or concerns? No  Items Reviewed: Did the pt receive and understand the discharge instructions provided? Yes  Medications obtained and verified? Yes  Other? No  Any new allergies since your discharge? No  Dietary orders reviewed? Yes Do you have support at home? Yes   Follow up appointments reviewed:  PCP Hospital f/u appt confirmed? Yes  Scheduled to see Eustaquio Boyden, MD on 12/05/2023 @ [facility] [place] Specialist Hospital f/u appt confirmed? No  Scheduled to see  on  @ . Are transportation arrangements needed? No  If their condition worsens, is the pt aware to call PCP or go to the Emergency Dept.? Yes Was the patient provided with contact information for the PCP's office or ED? Yes Was to pt encouraged to call back with questions or concerns? Yes   Jermaine Tholl [facility]  [facility] Guide Direct Dial: [phone]  Website: [email or web address]

## 2023-09-08 ENCOUNTER — Other Ambulatory Visit: Payer: BC Managed Care – PPO

## 2023-09-12 ENCOUNTER — Other Ambulatory Visit (INDEPENDENT_AMBULATORY_CARE_PROVIDER_SITE_OTHER): Payer: BC Managed Care – PPO

## 2023-09-12 DIAGNOSIS — E039 Hypothyroidism, unspecified: Secondary | ICD-10-CM

## 2023-09-12 LAB — TSH: TSH: 3.74 u[IU]/mL (ref 0.35–5.50)

## 2023-11-23 ENCOUNTER — Other Ambulatory Visit: Payer: Self-pay | Admitting: Family Medicine

## 2023-11-23 DIAGNOSIS — E1169 Type 2 diabetes mellitus with other specified complication: Secondary | ICD-10-CM

## 2023-11-23 DIAGNOSIS — E039 Hypothyroidism, unspecified: Secondary | ICD-10-CM

## 2023-11-23 DIAGNOSIS — E1140 Type 2 diabetes mellitus with diabetic neuropathy, unspecified: Secondary | ICD-10-CM

## 2023-11-23 DIAGNOSIS — M1A00X0 Idiopathic chronic gout, unspecified site, without tophus (tophi): Secondary | ICD-10-CM

## 2023-11-28 ENCOUNTER — Other Ambulatory Visit: Payer: BC Managed Care – PPO

## 2023-11-28 ENCOUNTER — Other Ambulatory Visit (INDEPENDENT_AMBULATORY_CARE_PROVIDER_SITE_OTHER): Payer: BC Managed Care – PPO

## 2023-11-28 DIAGNOSIS — E785 Hyperlipidemia, unspecified: Secondary | ICD-10-CM | POA: Diagnosis not present

## 2023-11-28 DIAGNOSIS — E1140 Type 2 diabetes mellitus with diabetic neuropathy, unspecified: Secondary | ICD-10-CM

## 2023-11-28 DIAGNOSIS — E1169 Type 2 diabetes mellitus with other specified complication: Secondary | ICD-10-CM

## 2023-11-28 DIAGNOSIS — M1A00X0 Idiopathic chronic gout, unspecified site, without tophus (tophi): Secondary | ICD-10-CM

## 2023-11-28 DIAGNOSIS — E039 Hypothyroidism, unspecified: Secondary | ICD-10-CM | POA: Diagnosis not present

## 2023-11-29 LAB — COMPREHENSIVE METABOLIC PANEL
AG Ratio: 1.4 (calc) (ref 1.0–2.5)
ALT: 34 U/L (ref 9–46)
AST: 18 U/L (ref 10–40)
Albumin: 4.1 g/dL (ref 3.6–5.1)
Alkaline phosphatase (APISO): 58 U/L (ref 36–130)
BUN: 14 mg/dL (ref 7–25)
CO2: 28 mmol/L (ref 20–32)
Calcium: 9.2 mg/dL (ref 8.6–10.3)
Chloride: 103 mmol/L (ref 98–110)
Creat: 0.81 mg/dL (ref 0.60–1.29)
Globulin: 2.9 g/dL (ref 1.9–3.7)
Glucose, Bld: 108 mg/dL — ABNORMAL HIGH (ref 65–99)
Potassium: 4 mmol/L (ref 3.5–5.3)
Sodium: 141 mmol/L (ref 135–146)
Total Bilirubin: 0.4 mg/dL (ref 0.2–1.2)
Total Protein: 7 g/dL (ref 6.1–8.1)

## 2023-11-29 LAB — LIPID PANEL
Cholesterol: 117 mg/dL (ref <200)
HDL: 32 mg/dL — ABNORMAL LOW (ref >=40)
LDL Cholesterol (Calc): 69 mg/dL
Non-HDL Cholesterol (Calc): 85 mg/dL (ref <130)
Total CHOL/HDL Ratio: 3.7 (calc) (ref <5.0)
Triglycerides: 82 mg/dL (ref <150)

## 2023-11-29 LAB — URIC ACID: Uric Acid, Serum: 7.2 mg/dL (ref 4.0–8.0)

## 2023-11-29 LAB — MICROALBUMIN / CREATININE URINE RATIO
Creatinine, Urine: 206 mg/dL (ref 20–320)
Microalb Creat Ratio: 6 mg/g{creat} (ref <30)
Microalb, Ur: 1.2 mg/dL

## 2023-11-29 LAB — HEMOGLOBIN A1C
Hgb A1c MFr Bld: 8.2 %{Hb} — ABNORMAL HIGH (ref <5.7)
Mean Plasma Glucose: 189 mg/dL
eAG (mmol/L): 10.4 mmol/L

## 2023-11-29 LAB — TSH: TSH: 2.76 m[IU]/L (ref 0.40–4.50)

## 2023-12-03 ENCOUNTER — Other Ambulatory Visit: Payer: Self-pay | Admitting: Family Medicine

## 2023-12-03 ENCOUNTER — Other Ambulatory Visit: Payer: Self-pay | Admitting: Podiatry

## 2023-12-04 NOTE — Telephone Encounter (Signed)
Has CPE appt tomorrow. Will send refill at this visit.

## 2023-12-05 ENCOUNTER — Encounter: Payer: Self-pay | Admitting: Family Medicine

## 2023-12-05 ENCOUNTER — Ambulatory Visit: Payer: BC Managed Care – PPO | Admitting: Family Medicine

## 2023-12-05 VITALS — BP 134/76 | HR 85 | Temp 98.3°F | Ht 69.5 in | Wt 285.1 lb

## 2023-12-05 DIAGNOSIS — R0981 Nasal congestion: Secondary | ICD-10-CM

## 2023-12-05 DIAGNOSIS — F410 Panic disorder [episodic paroxysmal anxiety] without agoraphobia: Secondary | ICD-10-CM

## 2023-12-05 DIAGNOSIS — I10 Essential (primary) hypertension: Secondary | ICD-10-CM

## 2023-12-05 DIAGNOSIS — E1140 Type 2 diabetes mellitus with diabetic neuropathy, unspecified: Secondary | ICD-10-CM | POA: Diagnosis not present

## 2023-12-05 DIAGNOSIS — Z0000 Encounter for general adult medical examination without abnormal findings: Secondary | ICD-10-CM | POA: Diagnosis not present

## 2023-12-05 DIAGNOSIS — E1169 Type 2 diabetes mellitus with other specified complication: Secondary | ICD-10-CM

## 2023-12-05 DIAGNOSIS — E039 Hypothyroidism, unspecified: Secondary | ICD-10-CM

## 2023-12-05 DIAGNOSIS — K5140 Inflammatory polyps of colon without complications: Secondary | ICD-10-CM | POA: Insufficient documentation

## 2023-12-05 DIAGNOSIS — G4730 Sleep apnea, unspecified: Secondary | ICD-10-CM

## 2023-12-05 DIAGNOSIS — E785 Hyperlipidemia, unspecified: Secondary | ICD-10-CM

## 2023-12-05 DIAGNOSIS — Z23 Encounter for immunization: Secondary | ICD-10-CM

## 2023-12-05 DIAGNOSIS — R221 Localized swelling, mass and lump, neck: Secondary | ICD-10-CM

## 2023-12-05 DIAGNOSIS — M1A00X0 Idiopathic chronic gout, unspecified site, without tophus (tophi): Secondary | ICD-10-CM

## 2023-12-05 DIAGNOSIS — E890 Postprocedural hypothyroidism: Secondary | ICD-10-CM

## 2023-12-05 MED ORDER — FLUTICASONE PROPIONATE 50 MCG/ACT NA SUSP: 2.0000 | Freq: Every day | NASAL | 4 refills | Status: AC

## 2023-12-05 MED ORDER — ATORVASTATIN CALCIUM 20 MG PO TABS: 20.0000 mg | ORAL_TABLET | Freq: Every day | ORAL | 4 refills | Status: AC

## 2023-12-05 MED ORDER — COLCHICINE 0.6 MG PO TABS: 0.6000 mg | ORAL_TABLET | Freq: Every day | ORAL | 1 refills | Status: DC | PRN

## 2023-12-05 MED ORDER — LOSARTAN POTASSIUM 100 MG PO TABS
100.0000 mg | ORAL_TABLET | Freq: Every day | ORAL | 4 refills | Status: AC
Start: 2023-12-05 — End: ?

## 2023-12-05 MED ORDER — SEMAGLUTIDE (2 MG/DOSE) 8 MG/3ML ~~LOC~~ SOPN: 2.0000 mg | PEN_INJECTOR | SUBCUTANEOUS | 4 refills | Status: DC

## 2023-12-05 MED ORDER — LEVOTHYROXINE SODIUM 175 MCG PO TABS
175.0000 ug | ORAL_TABLET | Freq: Every day | ORAL | 4 refills | Status: AC
Start: 2023-12-05 — End: ?

## 2023-12-05 MED ORDER — METFORMIN HCL 1000 MG PO TABS
1000.0000 mg | ORAL_TABLET | Freq: Two times a day (BID) | ORAL | 4 refills | Status: AC
Start: 2023-12-05 — End: ?

## 2023-12-05 MED ORDER — HYDROXYZINE HCL 25 MG PO TABS: 25.0000 mg | ORAL_TABLET | Freq: Two times a day (BID) | ORAL | 3 refills | Status: AC | PRN

## 2023-12-05 NOTE — Progress Notes (Signed)
 Ph: [phone] Fax: [phone]   Patient ID: Jorge Boyle, male    DOB: 06/10/1976, 48 y.o.   MRN: [id number]  This visit was conducted in person.  BP 134/76   Pulse 85   Temp 98.3 F (36.8 C) (Oral)   Ht 5' 9.5" (1.765 m)   Wt 285 lb 2 oz (129.3 kg)   SpO2 94%   BMI 41.50 kg/m    CC: CPE Subjective:   HPI: Jorge Boyle is a 48 y.o. male presenting on 12/05/2023 for Annual Exam   Back on first shift for the past month.  Wife is visiting from [place] for valentine's day.   Saw pulm dx mild OSA. Rec weight loss, referred to healthy weight and wellness center as well as orthodontics. He's decided to hold off on Inspire device. He's been using wedge pillow with benefit.   DM - continues metformin 1000mg  bid and ozempic 2mg  weekly. Deteriorated control while on 3rd shift, for several months (was drinking more energy drinks ie red bulls).   1-2 mo h/o throat swelling on right associated with PNDrainage worse in the mornings, can cause gagging. Oral antihistamines and flonase haven't helped. No recent URTI.   S/p R hemithyroidectomy 2005 (R side and isthmus)  S/p total thyroidectomy 09/2020 - follicular adenoma with cystic degeneration Lady [facility]).    Preventative: COLONOSCOPY WITH PROPOFOL 06/17/2022 - HP, inflammatory polyp, 1.5cm and 2cm polyps, diverticulosis, sigmoid stricture, rpt 6 months Wyline Mood, MD) - # provided to schedule, new referral placed  Flu - yearly COVID vaccine Pfizer 01/2020, 02/2020, booster 09/2020, bivalent boost 07/2021, 08/2022 Tdap 12/2011 , 11/2022 Pneumovax 2016  Seat belt use discussed.  Sunscreen use discussed. Denies changing moles on skin.  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 Interim medical history since our last visit reviewed. Allergies and medications reviewed and updated. Outpatient Medications Prior to Visit  Medication Sig Dispense Refill   Blood Glucose Monitoring Suppl (CONTOUR NEXT ONE) KIT Use as instructed to check blood sugar once a day 1 kit 0   cyclobenzaprine (FLEXERIL) 10 MG tablet Take 0.5-1 tablets (5-10 mg total) by mouth 2 (two) times daily as needed for muscle spasms (sedation precautions). 30 tablet 1   glucose blood (CONTOUR NEXT TEST) test strip Use as instructed to check blood sugar once daily. 100 each 3   meloxicam (MOBIC) 15 MG tablet TAKE 1 TABLET (15 MG TOTAL) BY MOUTH DAILY. 30 tablet 3   Microlet Lancets MISC Use as directed to check blood sugars once daily. 100 each 3   naproxen (NAPROSYN) 500 MG tablet TAKE 1 TABLET BY MOUTH TWICE A DAY FOR 1 WEEK THEN AS NEEDED FOR PAIN TAKE WITH FOOD 50 tablet 0   atorvastatin (LIPITOR) 20 MG tablet Take 1 tablet (20 mg total) by mouth daily. 90 tablet 4   colchicine 0.6 MG tablet TAKE 1 TABLET (0.6 MG TOTAL) BY MOUTH DAILY AS NEEDED (GOUT FLARE). MAY TAKE 2 TABLETS ON FIRST DAY OF USE 30 tablet 3   fluticasone (FLONASE) 50 MCG/ACT  nasal spray Place 2 sprays into both nostrils daily. 48 g 3   hydrOXYzine (ATARAX) 25 MG tablet TAKE 0.5-1 TABLETS BY MOUTH 2 TIMES DAILY AS NEEDED FOR ANXIETY (SEDATION PRECAUTIONS). 90 tablet 1   levothyroxine (SYNTHROID) 175 MCG tablet Take 1 tablet (175 mcg total) by mouth daily before breakfast. 90 tablet 4   losartan (COZAAR) 100 MG tablet Take 1 tablet (100 mg total) by mouth daily. 90 tablet 4   metFORMIN (GLUCOPHAGE) 1000 MG tablet Take 1 tablet (1,000 mg total) by mouth 2 (two) times daily with a meal. 180 tablet 4   Semaglutide, 2 MG/DOSE, 8 MG/3ML SOPN Inject 2 mg as directed once a  week. 9 mL 4   No facility-administered medications prior to visit.     Per HPI unless specifically indicated in ROS section below Review of Systems  Constitutional:  Negative for activity change, appetite change, chills, fatigue, fever and unexpected weight change.  HENT:  Negative for hearing loss.   Eyes:  Negative for visual disturbance.  Respiratory:  Negative for cough, chest tightness, shortness of breath and wheezing.   Cardiovascular:  Negative for chest pain, palpitations and leg swelling.  Gastrointestinal:  Negative for abdominal distention, abdominal pain, blood in stool, constipation, diarrhea, nausea and vomiting.  Genitourinary:  Negative for difficulty urinating and hematuria.  Musculoskeletal:  Negative for arthralgias, myalgias and neck pain.  Skin:  Negative for rash.  Neurological:  Positive for headaches. Negative for dizziness, seizures and syncope.  Hematological:  Positive for adenopathy (R throat). Does not bruise/bleed easily.  Psychiatric/Behavioral:  Negative for dysphoric mood. The patient is not nervous/anxious.     Objective:  BP 134/76   Pulse 85   Temp 98.3 F (36.8 C) (Oral)   Ht 5' 9.5" (1.765 m)   Wt 285 lb 2 oz (129.3 kg)   SpO2 94%   BMI 41.50 kg/m   Wt Readings from Last 3 Encounters:  12/05/23 285 lb 2 oz (129.3 kg)  07/07/23 290 lb (131.5 kg)  06/03/23 290 lb 8 oz (131.8 kg)      Physical Exam Vitals and nursing note reviewed.  Constitutional:      General: He is not in acute distress.    Appearance: Normal appearance. He is well-developed. He is not ill-appearing.  HENT:     Head: Normocephalic and atraumatic.     Right Ear: Hearing, tympanic membrane, ear canal and external ear normal.     Left Ear: Hearing, tympanic membrane, ear canal and external ear normal.     Nose: Mucosal edema and congestion present. No rhinorrhea.     Right Turbinates: Swollen. Not enlarged or pale.     Left Turbinates: Swollen. Not enlarged or pale.      Right Sinus: No maxillary sinus tenderness or frontal sinus tenderness.     Left Sinus: No maxillary sinus tenderness or frontal sinus tenderness.     Mouth/Throat:     Mouth: Mucous membranes are moist.     Pharynx: Oropharynx is clear. No oropharyngeal exudate or posterior oropharyngeal erythema.  Eyes:     General: No scleral icterus.    Extraocular Movements: Extraocular movements intact.     Conjunctiva/sclera: Conjunctivae normal.     Pupils: Pupils are equal, round, and reactive to light.  Neck:     Thyroid: No thyroid mass or thyromegaly.      Comments: R cervical neck lump present  S/p thyroidectomy with residual scars Cardiovascular:     Rate and Rhythm:  Normal rate and regular rhythm.     Pulses: Normal pulses.          Radial pulses are 2+ on the right side and 2+ on the left side.     Heart sounds: Normal heart sounds. No murmur heard. Pulmonary:     Effort: Pulmonary effort is normal. No respiratory distress.     Breath sounds: Normal breath sounds. No wheezing, rhonchi or rales.  Abdominal:     General: Bowel sounds are normal. There is no distension.     Palpations: Abdomen is soft. There is no mass.     Tenderness: There is no abdominal tenderness. There is no guarding or rebound.     Hernia: No hernia is present.  Musculoskeletal:        General: Normal range of motion.     Cervical back: Normal range of motion and neck supple.     Right lower leg: No edema.     Left lower leg: No edema.  Skin:    General: Skin is warm and dry.     Findings: No rash.  Neurological:     General: No focal deficit present.     Mental Status: He is alert and oriented to person, place, and time.  Psychiatric:        Mood and Affect: Mood normal.        Behavior: Behavior normal.        Thought Content: Thought content normal.        Judgment: Judgment normal.       Results for orders placed or performed in visit on 11/28/23  Hemoglobin A1c   Collection Time: 11/28/23   3:46 PM  Result Value Ref Range   Hgb A1c MFr Bld 8.2 (H) <5.7 % of total Hgb   Mean Plasma Glucose 189 mg/dL   eAG (mmol/L) 91.4 mmol/L  Microalbumin / creatinine urine ratio   Collection Time: 11/28/23  3:46 PM  Result Value Ref Range   Creatinine, Urine 206 20 - 320 mg/dL   Microalb, Ur 1.2 mg/dL   Microalb Creat Ratio 6 <30 mg/g creat  Lipid panel   Collection Time: 11/28/23  3:46 PM  Result Value Ref Range   Cholesterol 117 <200 mg/dL   HDL 32 (L) > OR = 40 mg/dL   Triglycerides 82 <782 mg/dL   LDL Cholesterol (Calc) 69 mg/dL (calc)   Total CHOL/HDL Ratio 3.7 <5.0 (calc)   Non-HDL Cholesterol (Calc) 85 <956 mg/dL (calc)  Comprehensive metabolic panel   Collection Time: 11/28/23  3:46 PM  Result Value Ref Range   Glucose, Bld 108 (H) 65 - 99 mg/dL   BUN 14 7 - 25 mg/dL   Creat 2.13 0.86 - 5.78 mg/dL   BUN/Creatinine Ratio SEE NOTE: 6 - 22 (calc)   Sodium 141 135 - 146 mmol/L   Potassium 4.0 3.5 - 5.3 mmol/L   Chloride 103 98 - 110 mmol/L   CO2 28 20 - 32 mmol/L   Calcium 9.2 8.6 - 10.3 mg/dL   Total Protein 7.0 6.1 - 8.1 g/dL   Albumin 4.1 3.6 - 5.1 g/dL   Globulin 2.9 1.9 - 3.7 g/dL (calc)   AG Ratio 1.4 1.0 - 2.5 (calc)   Total Bilirubin 0.4 0.2 - 1.2 mg/dL   Alkaline phosphatase (APISO) 58 36 - 130 U/L   AST 18 10 - 40 U/L   ALT 34 9 - 46 U/L  Uric acid   Collection Time: 11/28/23  3:46  PM  Result Value Ref Range   Uric Acid, Serum 7.2 4.0 - 8.0 mg/dL  TSH   Collection Time: 11/28/23  3:46 PM  Result Value Ref Range   TSH 2.76 0.40 - 4.50 mIU/L    Assessment & Plan:   Problem List Items Addressed This Visit     Health maintenance examination - Primary (Chronic)   Preventative protocols reviewed and updated unless pt declined. Discussed healthy diet and lifestyle.       Obesity, morbid, BMI 40.0-49.9 (HCC)   Congratulated on weight loss to date. Encouraged ongoing efforts for sustainable weight loss  Continue Ozempic 2mg  weekly.       Relevant  Medications   metFORMIN (GLUCOPHAGE) 1000 MG tablet   Semaglutide, 2 MG/DOSE, 8 MG/3ML SOPN   Type 2 diabetes mellitus with diabetic neuropathy, unspecified (HCC)   Chronic, deteriorated in setting of recent night shift and increased sugared beverage intake (energy drinks).  He is back on day shift and anticipate improved glycemic control.  We did stop actos back in 05/2023.  Will reassess at 3-4 mo DM f/u visit       Relevant Medications   atorvastatin (LIPITOR) 20 MG tablet   losartan (COZAAR) 100 MG tablet   metFORMIN (GLUCOPHAGE) 1000 MG tablet   Semaglutide, 2 MG/DOSE, 8 MG/3ML SOPN   Chronic nasal congestion   Chronic issue despite regular flonase and claritin use.  He notes ongoing PNDrainage and now with new lump present to R anterior neck over the past 1-2 months will refer to ENT for eval as per below.       Relevant Medications   fluticasone (FLONASE) 50 MCG/ACT nasal spray   Other Relevant Orders   Ambulatory referral to ENT   Gout   Urate levels elevated however no recent flare. Refilled colchicine to use PRN       Dyslipidemia associated with type 2 diabetes mellitus (HCC)   Chronic, stable. Continue atorvastatin 20mg  daily. The ASCVD Risk score (Arnett DK, et al., 2019) failed to calculate for the following reasons:   The valid total cholesterol range is 130 to 320 mg/dL       Relevant Medications   atorvastatin (LIPITOR) 20 MG tablet   losartan (COZAAR) 100 MG tablet   metFORMIN (GLUCOPHAGE) 1000 MG tablet   Semaglutide, 2 MG/DOSE, 8 MG/3ML SOPN   Essential hypertension   Chronic, stable on losartan - continue.       Relevant Medications   atorvastatin (LIPITOR) 20 MG tablet   losartan (COZAAR) 100 MG tablet   Localized swelling, mass and lump, neck   New lump present over the past few months associated with PNDrainage and am choking.  H/o thyroid surgeries ?LAD from dental disease.  ?branchial cleft cyst - offered contrasted neck CT scan, he prefers  to seek ENT evaluation first.      Relevant Orders   Ambulatory referral to ENT   S/P total thyroidectomy   Acquired hypothyroidism   Chronic acquired post-thyroidectomy hypothyroidism.  Stable on levothyroxine daily - continue      Relevant Medications   levothyroxine (SYNTHROID) 175 MCG tablet   Anxiety attack   Stable period, managed with PRN hydroxyzine.       Relevant Medications   hydrOXYzine (ATARAX) 25 MG tablet   Mild sleep apnea   Managing with wedge pillow with benefit.       Pseudopolyp of sigmoid colon without complication (HCC)   Overdue for 6 mo f/u flex sig - will  refer back to GI, # provided for him to call and schedule appt.       Relevant Orders   Ambulatory referral to Gastroenterology   Other Visit Diagnoses       Encounter for immunization       Relevant Orders   Flu vaccine trivalent PF, 6mos and older(Flulaval,Afluria,Fluarix,Fluzone) (Completed)        Meds ordered this encounter  Medications   atorvastatin (LIPITOR) 20 MG tablet    Sig: Take 1 tablet (20 mg total) by mouth daily.    Dispense:  90 tablet    Refill:  4   fluticasone (FLONASE) 50 MCG/ACT nasal spray    Sig: Place 2 sprays into both nostrils daily.    Dispense:  48 g    Refill:  4   hydrOXYzine (ATARAX) 25 MG tablet    Sig: Take 1 tablet (25 mg total) by mouth 2 (two) times daily as needed for anxiety (sedation precautions).    Dispense:  30 tablet    Refill:  3   levothyroxine (SYNTHROID) 175 MCG tablet    Sig: Take 1 tablet (175 mcg total) by mouth daily before breakfast.    Dispense:  90 tablet    Refill:  4   losartan (COZAAR) 100 MG tablet    Sig: Take 1 tablet (100 mg total) by mouth daily.    Dispense:  90 tablet    Refill:  4   metFORMIN (GLUCOPHAGE) 1000 MG tablet    Sig: Take 1 tablet (1,000 mg total) by mouth 2 (two) times daily with a meal.    Dispense:  180 tablet    Refill:  4   Semaglutide, 2 MG/DOSE, 8 MG/3ML SOPN    Sig: Inject 2 mg as  directed once a week.    Dispense:  9 mL    Refill:  4   colchicine 0.6 MG tablet    Sig: Take 1 tablet (0.6 mg total) by mouth daily as needed (gout flare). May take 2 tablets on first day of use    Dispense:  30 tablet    Refill:  1    Orders Placed This Encounter  Procedures   Flu vaccine trivalent PF, 6mos and older(Flulaval,Afluria,Fluarix,Fluzone)   Ambulatory referral to Gastroenterology    Referral Priority:   Routine    Referral Type:   Consultation    Referral Reason:   Specialty Services Required    Number of Visits Requested:   1   Ambulatory referral to ENT    Referral Priority:   Routine    Referral Type:   Consultation    Referral Reason:   Specialty Services Required    Requested Specialty:   Otolaryngology    Number of Visits Requested:   1    Patient Instructions  Flu shot today You're due for repeat sigmoidoscopy - call Dr Lesia Sago GI at [phone] to schedule an appointment.  We will refer you to Dr Jenne Campus ENT for chronic sinus congestion and R throat swelling.  Return in 4 months for diabetes follow up  Follow up plan: Return in about 1 year (around 12/04/2024), or if symptoms worsen or fail to improve, for annual exam, prior fasting for blood work.  Eustaquio Boyden, MD

## 2023-12-05 NOTE — Assessment & Plan Note (Signed)
Congratulated on weight loss to date. Encouraged ongoing efforts for sustainable weight loss  Continue Ozempic 2mg  weekly.

## 2023-12-05 NOTE — Assessment & Plan Note (Addendum)
Chronic issue despite regular flonase and claritin use.  He notes ongoing PNDrainage and now with new lump present to R anterior neck over the past 1-2 months will refer to ENT for eval as per below.

## 2023-12-05 NOTE — Patient Instructions (Addendum)
Flu shot today You're due for repeat sigmoidoscopy - call Dr Lesia Sago GI at [phone] to schedule an appointment.  We will refer you to Dr Jenne Campus ENT for chronic sinus congestion and R throat swelling.  Return in 4 months for diabetes follow up

## 2023-12-05 NOTE — Assessment & Plan Note (Signed)
Chronic, stable. Continue atorvastatin 20mg  daily. The ASCVD Risk score (Arnett DK, et al., 2019) failed to calculate for the following reasons:   The valid total cholesterol range is 130 to 320 mg/dL

## 2023-12-05 NOTE — Assessment & Plan Note (Signed)
Managing with wedge pillow with benefit.

## 2023-12-05 NOTE — Assessment & Plan Note (Signed)
Stable period, managed with PRN hydroxyzine.

## 2023-12-05 NOTE — Assessment & Plan Note (Addendum)
Overdue for 6 mo f/u flex sig - will refer back to GI, # provided for him to call and schedule appt.

## 2023-12-05 NOTE — Assessment & Plan Note (Signed)
Urate levels elevated however no recent flare. Refilled colchicine to use PRN

## 2023-12-05 NOTE — Assessment & Plan Note (Signed)
Chronic, stable on losartan - continue.

## 2023-12-05 NOTE — Assessment & Plan Note (Addendum)
Chronic acquired post-thyroidectomy hypothyroidism.  Stable on levothyroxine daily - continue

## 2023-12-05 NOTE — Assessment & Plan Note (Signed)
Chronic, deteriorated in setting of recent night shift and increased sugared beverage intake (energy drinks).  He is back on day shift and anticipate improved glycemic control.  We did stop actos back in 05/2023.  Will reassess at 3-4 mo DM f/u visit

## 2023-12-05 NOTE — Assessment & Plan Note (Signed)
Preventative protocols reviewed and updated unless pt declined. Discussed healthy diet and lifestyle.

## 2023-12-05 NOTE — Assessment & Plan Note (Addendum)
New lump present over the past few months associated with PNDrainage and am choking.  H/o thyroid surgeries ?LAD from dental disease.  ?branchial cleft cyst - offered contrasted neck CT scan, he prefers to seek ENT evaluation first.

## 2023-12-12 ENCOUNTER — Telehealth: Payer: Self-pay

## 2023-12-12 MED ORDER — COLCHICINE 0.6 MG PO TABS: ORAL_TABLET | ORAL | 1 refills | Status: DC

## 2023-12-12 NOTE — Telephone Encounter (Signed)
 New Rx sent to pharmacy

## 2023-12-31 DIAGNOSIS — J301 Allergic rhinitis due to pollen: Secondary | ICD-10-CM | POA: Diagnosis not present

## 2023-12-31 DIAGNOSIS — J309 Allergic rhinitis, unspecified: Secondary | ICD-10-CM | POA: Diagnosis not present

## 2023-12-31 DIAGNOSIS — R221 Localized swelling, mass and lump, neck: Secondary | ICD-10-CM | POA: Diagnosis not present

## 2024-01-01 ENCOUNTER — Other Ambulatory Visit: Payer: Self-pay | Admitting: Unknown Physician Specialty

## 2024-01-01 DIAGNOSIS — R221 Localized swelling, mass and lump, neck: Secondary | ICD-10-CM

## 2024-01-07 ENCOUNTER — Ambulatory Visit
Admission: RE | Admit: 2024-01-07 | Discharge: 2024-01-07 | Disposition: A | Discharge status: Home / Self Care | Source: Ambulatory Visit | Attending: Unknown Physician Specialty | Admitting: Unknown Physician Specialty

## 2024-01-07 DIAGNOSIS — R221 Localized swelling, mass and lump, neck: Secondary | ICD-10-CM

## 2024-01-07 MED ORDER — IOPAMIDOL (ISOVUE-300) INJECTION 61%
75.0000 mL | Freq: Once | INTRAVENOUS | Status: AC | PRN
  Administered 2024-01-07: 75 mL via INTRAVENOUS

## 2024-01-19 ENCOUNTER — Encounter: Payer: Self-pay | Admitting: *Deleted

## 2024-01-20 ENCOUNTER — Encounter: Payer: Self-pay | Admitting: Primary Care

## 2024-01-25 ENCOUNTER — Other Ambulatory Visit: Payer: Self-pay | Admitting: Family Medicine

## 2024-01-25 DIAGNOSIS — M1A00X0 Idiopathic chronic gout, unspecified site, without tophus (tophi): Secondary | ICD-10-CM

## 2024-01-26 NOTE — Telephone Encounter (Signed)
 Message from pt via pharmacy:  Requesting 1 year supply   Colchicine Last filled:  12/12/23, #30 Last OV:  12/05/23, CPE Next OV:  04/02/24, 4 mo DM f/u

## 2024-02-04 ENCOUNTER — Encounter: Payer: Self-pay | Admitting: Family Medicine

## 2024-02-13 MED ORDER — GLIMEPIRIDE 1 MG PO TABS: 1.0000 mg | ORAL_TABLET | Freq: Every day | ORAL | 3 refills | Status: DC

## 2024-03-17 ENCOUNTER — Encounter: Payer: Self-pay | Admitting: Family Medicine

## 2024-03-23 ENCOUNTER — Other Ambulatory Visit: Payer: Self-pay | Admitting: Podiatry

## 2024-03-23 DIAGNOSIS — E119 Type 2 diabetes mellitus without complications: Secondary | ICD-10-CM | POA: Diagnosis not present

## 2024-03-23 LAB — HM DIABETES EYE EXAM

## 2024-04-02 ENCOUNTER — Encounter: Payer: Self-pay | Admitting: Family Medicine

## 2024-04-02 ENCOUNTER — Ambulatory Visit: Payer: BC Managed Care – PPO | Admitting: Family Medicine

## 2024-04-02 VITALS — BP 136/84 | HR 78 | Temp 98.1°F | Ht 69.5 in | Wt 287.4 lb

## 2024-04-02 DIAGNOSIS — Z7985 Long-term (current) use of injectable non-insulin antidiabetic drugs: Secondary | ICD-10-CM

## 2024-04-02 DIAGNOSIS — I10 Essential (primary) hypertension: Secondary | ICD-10-CM | POA: Diagnosis not present

## 2024-04-02 DIAGNOSIS — E1140 Type 2 diabetes mellitus with diabetic neuropathy, unspecified: Secondary | ICD-10-CM | POA: Diagnosis not present

## 2024-04-02 DIAGNOSIS — Z6841 Body Mass Index (BMI) 40.0 and over, adult: Secondary | ICD-10-CM

## 2024-04-02 LAB — POCT GLYCOSYLATED HEMOGLOBIN (HGB A1C): Hemoglobin A1C: 10 % — AB (ref 4.0–5.6)

## 2024-04-02 MED ORDER — TIRZEPATIDE 7.5 MG/0.5ML ~~LOC~~ SOAJ: 7.5000 mg | SUBCUTANEOUS | 3 refills | Status: DC

## 2024-04-02 MED ORDER — TIRZEPATIDE 2.5 MG/0.5ML ~~LOC~~ SOAJ: 2.5000 mg | SUBCUTANEOUS | 0 refills | Status: DC

## 2024-04-02 MED ORDER — TIRZEPATIDE 5 MG/0.5ML ~~LOC~~ SOAJ: 5.0000 mg | SUBCUTANEOUS | 0 refills | Status: DC

## 2024-04-02 MED ORDER — GLIMEPIRIDE 2 MG PO TABS: 2.0000 mg | ORAL_TABLET | Freq: Every day | ORAL | 2 refills | Status: AC

## 2024-04-02 NOTE — Assessment & Plan Note (Signed)
 Chronic, deteriorated again.  He states he's limiting sweetened beverages.  Sent home with DexCom G7 sample.  Discussed choosing low glycemic index foods.  Increase amaryl  to 2mg  daily, change ozempic  to mounjaro - monthly titration. RTC 3 mo DM f/u.  Discussed possible insulin commencement if above changes don't improve glycemic control.  Actos  stopped 05/2023.

## 2024-04-02 NOTE — Assessment & Plan Note (Signed)
 Chronic, on losartan  100mg  daily. Some BP elevations at home however in office reading adequate.  Continue to monitor.

## 2024-04-02 NOTE — Assessment & Plan Note (Signed)
 24 hour food recall performed, reviewed. Already limiting added sugar, sweetened beverages.  Rec limit carb portion sizes.

## 2024-04-02 NOTE — Telephone Encounter (Signed)
 Discussed with pt at OV today

## 2024-04-02 NOTE — Patient Instructions (Addendum)
 Stop ozempic . In its place start Mounjaro 2.5mg  weekly for 1 month then increase to 5mg  weekly then 7.5mg  weekly.  Return in 3 months for diabetes follow up.  Bring us  a copy of allergy testing done for ENT  Choose low glycemic index foods.

## 2024-04-02 NOTE — Progress Notes (Signed)
 Ph: [phone] Fax: [phone]   Patient ID: Jorge Boyle, male    DOB: 1976-04-20, 48 y.o.   MRN: [id number]  This visit was conducted in person.  BP 136/84   Pulse 78   Temp 98.1 F (36.7 C) (Oral)   Ht 5' 9.5 (1.765 m)   Wt 287 lb 6 oz (130.4 kg)   SpO2 96%   BMI 41.83 kg/m   BP Readings from Last 3 Encounters:  04/02/24 136/84  12/05/23 134/76  07/07/23 (!) 142/99    CC: 4 mo DM f/u visit  Subjective:   HPI: Jorge Boyle is a 48 y.o. male presenting on 04/02/2024 for Medical Management of Chronic Issues (Here for 4 mo DM f/u.)   HTN - Compliant with current antihypertensive regimen of losartan  100mg  daily.  Does check blood pressures at home: nave been running high 130-150s/80-100s. 152/100 yesterday. No low blood pressure readings or symptoms of dizziness/syncope. Denies HA, vision changes, CP/tightness, SOB, leg swelling.   DM - does regularly check sugars have been running very high - 200-500s. Fasting yesterday 191. Compliant with antihyperglycemic regimen which includes: amaryl  1mg  daily, metformin  1000mg  bid, ozempic  2mg  weekly. Denies low sugars or hypoglycemic symptoms. Denies paresthesias, blurry vision. Recent leg numbness with associated ache no burning pain. Last diabetic eye exam 03/2024. Glucometer brand: contour next. Last foot exam: 05/2023. DSME: has declined.  Lab Results  Component Value Date   HGBA1C 10.0 (A) 04/02/2024   Diabetic Foot Exam - Simple   Simple Foot Form Diabetic Foot exam was performed with the following findings: Yes 04/02/2024  7:44 AM  Visual Inspection No deformities, no ulcerations, no other skin breakdown bilaterally: Yes Sensation Testing Intact to touch and monofilament testing bilaterally: Yes Pulse Check Posterior Tibialis and Dorsalis pulse intact bilaterally: Yes Comments No claudication Callus to R medial great toe    Lab Results  Component Value Date   MICROALBUR 1.2 11/28/2023    Breakfast 5am -  breakfast bowl or eggs Lunch - normally skips  Dinner 5-7pm - at home, varies - taco salad at [facility] last night  Mainly drinks water      Relevant past medical, surgical, family and social history reviewed and updated as indicated. Interim medical history since our last visit reviewed. Allergies and medications reviewed and updated. Outpatient Medications Prior to Visit  Medication Sig Dispense Refill   atorvastatin  (LIPITOR) 20 MG tablet Take 1 tablet (20 mg total) by mouth daily. 90 tablet 4   Blood Glucose Monitoring Suppl (CONTOUR NEXT ONE) KIT Use as instructed to check blood sugar once a day 1 kit 0   colchicine  0.6 MG tablet TAKE 2 TABLETS BY MOUTH AT ONSET OF GOUT FLARE, THEN TAKE 1  TABLET BY MOUTH DAILY AS NEEDED  FOR GOUT FLARE 60 tablet 5   cyclobenzaprine  (FLEXERIL ) 10 MG tablet Take 0.5-1 tablets (5-10 mg total) by mouth 2 (two) times daily as needed for muscle spasms (sedation precautions). 30 tablet 1   fluticasone  (FLONASE ) 50 MCG/ACT nasal spray Place 2 sprays into both nostrils daily. 48 g 4   glucose blood (CONTOUR NEXT TEST) test strip Use as instructed to check blood sugar once daily. 100 each 3   hydrOXYzine  (ATARAX ) 25 MG tablet Take 1 tablet (25 mg total) by mouth 2 (two) times daily as needed for anxiety (sedation precautions). 30 tablet 3   levothyroxine  (SYNTHROID ) 175 MCG tablet Take 1 tablet (175 mcg total) by mouth daily before breakfast. 90 tablet  4   losartan  (COZAAR ) 100 MG tablet Take 1 tablet (100 mg total) by mouth daily. 90 tablet 4   meloxicam  (MOBIC ) 15 MG tablet TAKE 1 TABLET (15 MG TOTAL) BY MOUTH DAILY. 30 tablet 3   metFORMIN  (GLUCOPHAGE ) 1000 MG tablet Take 1 tablet (1,000 mg total) by mouth 2 (two) times daily with a meal. 180 tablet 4   Microlet Lancets MISC Use as directed to check blood sugars once daily. 100 each 3   naproxen  (NAPROSYN ) 500 MG tablet TAKE 1 TABLET BY MOUTH TWICE A DAY FOR 1 WEEK THEN AS NEEDED FOR PAIN TAKE WITH FOOD 50 tablet  0   glimepiride  (AMARYL ) 1 MG tablet Take 1 tablet (1 mg total) by mouth daily with breakfast. 90 tablet 3   Semaglutide , 2 MG/DOSE, 8 MG/3ML SOPN Inject 2 mg as directed once a week. 9 mL 4   No facility-administered medications prior to visit.     Per HPI unless specifically indicated in ROS section below Review of Systems  Objective:  BP 136/84   Pulse 78   Temp 98.1 F (36.7 C) (Oral)   Ht 5' 9.5 (1.765 m)   Wt 287 lb 6 oz (130.4 kg)   SpO2 96%   BMI 41.83 kg/m   Wt Readings from Last 3 Encounters:  04/02/24 287 lb 6 oz (130.4 kg)  12/05/23 285 lb 2 oz (129.3 kg)  07/07/23 290 lb (131.5 kg)      Physical Exam Vitals and nursing note reviewed.  Constitutional:      Appearance: Normal appearance. He is not ill-appearing.   Eyes:     Extraocular Movements: Extraocular movements intact.     Conjunctiva/sclera: Conjunctivae normal.     Pupils: Pupils are equal, round, and reactive to light.    Cardiovascular:     Rate and Rhythm: Normal rate and regular rhythm.     Pulses: Normal pulses.     Heart sounds: Normal heart sounds. No murmur heard. Pulmonary:     Effort: Pulmonary effort is normal. No respiratory distress.     Breath sounds: Normal breath sounds. No wheezing, rhonchi or rales.   Musculoskeletal:     Right lower leg: No edema.     Left lower leg: No edema.     Comments: See HPI for foot exam if done   Skin:    General: Skin is warm and dry.     Findings: No rash.   Neurological:     Mental Status: He is alert.   Psychiatric:        Mood and Affect: Mood normal.        Behavior: Behavior normal.       Results for orders placed or performed in visit on 04/02/24  POCT glycosylated hemoglobin (Hb A1C)   Collection Time: 04/02/24  7:34 AM  Result Value Ref Range   Hemoglobin A1C 10.0 (A) 4.0 - 5.6 %   HbA1c POC (<> result, manual entry)     HbA1c, POC (prediabetic range)     HbA1c, POC (controlled diabetic range)      Assessment & Plan:    Problem List Items Addressed This Visit     Obesity, morbid, BMI 40.0-49.9 (HCC)   24 hour food recall performed, reviewed. Already limiting added sugar, sweetened beverages.  Rec limit carb portion sizes.       Relevant Medications   glimepiride  (AMARYL ) 2 MG tablet   tirzepatide (MOUNJARO) 2.5 MG/0.5ML Pen   tirzepatide (MOUNJARO) 5 MG/0.5ML  Pen (Start on 04/30/2024)   tirzepatide (MOUNJARO) 7.5 MG/0.5ML Pen (Start on 05/08/2024)   Type 2 diabetes mellitus with diabetic neuropathy, unspecified (HCC) - Primary   Chronic, deteriorated again.  He states he's limiting sweetened beverages.  Sent home with DexCom G7 sample.  Discussed choosing low glycemic index foods.  Increase amaryl  to 2mg  daily, change ozempic  to mounjaro - monthly titration. RTC 3 mo DM f/u.  Discussed possible insulin commencement if above changes don't improve glycemic control.  Actos  stopped 05/2023.       Relevant Medications   glimepiride  (AMARYL ) 2 MG tablet   tirzepatide [facility]) 2.5 MG/0.5ML Pen   tirzepatide [facility]) 5 MG/0.5ML Pen (Start on 04/30/2024)   tirzepatide [facility]) 7.5 MG/0.5ML Pen (Start on 05/08/2024)   Other Relevant Orders   POCT glycosylated hemoglobin (Hb A1C) (Completed)   Essential hypertension   Chronic, on losartan  100mg  daily. Some BP elevations at home however in office reading adequate.  Continue to monitor.         Meds ordered this encounter  Medications   glimepiride  (AMARYL ) 2 MG tablet    Sig: Take 1 tablet (2 mg total) by mouth daily with breakfast.    Dispense:  90 tablet    Refill:  2    Note new dose   tirzepatide (MOUNJARO) 2.5 MG/0.5ML Pen    Sig: Inject 2.5 mg into the skin once a week.    Dispense:  2 mL    Refill:  0    To replace ozempic    tirzepatide (MOUNJARO) 5 MG/0.5ML Pen    Sig: Inject 5 mg into the skin once a week.    Dispense:  2 mL    Refill:  0   tirzepatide (MOUNJARO) 7.5 MG/0.5ML Pen    Sig: Inject 7.5 mg into the skin once a  week.    Dispense:  2 mL    Refill:  3    Orders Placed This Encounter  Procedures   POCT glycosylated hemoglobin (Hb A1C)    Patient Instructions  Stop ozempic . In its place start Mounjaro 2.5mg  weekly for 1 month then increase to 5mg  weekly then 7.5mg  weekly.  Return in 3 months for diabetes follow up.  Bring us  a copy of allergy testing done for ENT  Choose low glycemic index foods.   Follow up plan: Return in about 3 months (around 07/03/2024) for follow up visit.  Claire Crick, MD

## 2024-04-08 ENCOUNTER — Telehealth: Payer: Self-pay | Admitting: Family Medicine

## 2024-04-08 DIAGNOSIS — J3089 Other allergic rhinitis: Secondary | ICD-10-CM

## 2024-04-08 NOTE — Telephone Encounter (Signed)
 Placed results in Dr Timoteo Expose box.

## 2024-04-08 NOTE — Telephone Encounter (Signed)
 Pt's mom, Diane, brought by pt's recent lab results for Dr. Crissie Dome to review. Results are in Dr. Ocie Belt folder up front. Call back # [phone]

## 2024-04-09 DIAGNOSIS — J309 Allergic rhinitis, unspecified: Secondary | ICD-10-CM | POA: Insufficient documentation

## 2024-04-09 NOTE — Telephone Encounter (Signed)
 Labs reviewed thanks. Highly allergy to timothy grass, bluegrass and [place]  grass, bahia grass and D pteronyssinus (dust mite)

## 2024-04-15 ENCOUNTER — Encounter: Payer: Self-pay | Admitting: Unknown Physician Specialty

## 2024-06-29 ENCOUNTER — Other Ambulatory Visit: Payer: Self-pay | Admitting: Family Medicine

## 2024-07-05 ENCOUNTER — Encounter: Payer: Self-pay | Admitting: Family Medicine

## 2024-07-05 ENCOUNTER — Ambulatory Visit: Admitting: Family Medicine

## 2024-07-05 VITALS — BP 138/78 | HR 96 | Temp 98.4°F | Ht 69.5 in | Wt 286.0 lb

## 2024-07-05 DIAGNOSIS — Z7984 Long term (current) use of oral hypoglycemic drugs: Secondary | ICD-10-CM

## 2024-07-05 DIAGNOSIS — E1140 Type 2 diabetes mellitus with diabetic neuropathy, unspecified: Secondary | ICD-10-CM | POA: Diagnosis not present

## 2024-07-05 DIAGNOSIS — Z7985 Long-term (current) use of injectable non-insulin antidiabetic drugs: Secondary | ICD-10-CM

## 2024-07-05 DIAGNOSIS — L0233 Carbuncle of buttock: Secondary | ICD-10-CM | POA: Diagnosis not present

## 2024-07-05 DIAGNOSIS — Z23 Encounter for immunization: Secondary | ICD-10-CM

## 2024-07-05 LAB — POCT GLYCOSYLATED HEMOGLOBIN (HGB A1C): Hemoglobin A1C: 11.9 % — AB (ref 4.0–5.6)

## 2024-07-05 MED ORDER — BLOOD GLUCOSE TEST VI STRP: 1.0000 | ORAL_STRIP | Freq: Three times a day (TID) | 0 refills | Status: AC

## 2024-07-05 MED ORDER — LANCET DEVICE MISC
1.0000 | Freq: Three times a day (TID) | 0 refills | Status: AC
Start: 2024-07-05 — End: 2024-08-04

## 2024-07-05 MED ORDER — LANCETS MISC. MISC: 1.0000 | Freq: Three times a day (TID) | 0 refills | Status: AC

## 2024-07-05 MED ORDER — BLOOD GLUCOSE MONITORING SUPPL DEVI: 1.0000 | Freq: Three times a day (TID) | 0 refills | Status: AC

## 2024-07-05 MED ORDER — TIRZEPATIDE 7.5 MG/0.5ML ~~LOC~~ SOAJ: 7.5000 mg | SUBCUTANEOUS | 0 refills | Status: DC

## 2024-07-05 MED ORDER — TIRZEPATIDE 10 MG/0.5ML ~~LOC~~ SOAJ: 10.0000 mg | SUBCUTANEOUS | 1 refills | Status: DC

## 2024-07-05 MED ORDER — DOXYCYCLINE HYCLATE 100 MG PO TABS: 100.0000 mg | ORAL_TABLET | Freq: Two times a day (BID) | ORAL | 0 refills | Status: AC

## 2024-07-05 NOTE — Assessment & Plan Note (Addendum)
 Already has several openings to area. No obvious fluctuance. Not confident I&D would be successful so not performed today. Rx doxycycline  course. Rec start warm compresses, warm sitz bath.  Encouraged improved glycemic control.  Wound culture sent. Wound covered with gauze and paper tape RTC 2d wound check.

## 2024-07-05 NOTE — Progress Notes (Signed)
 Ph: [phone] Fax: [phone]   Patient ID: Jorge Boyle, male    DOB: 07-09-1976, 48 y.o.   MRN: [id number]  This visit was conducted in person.  BP 138/78   Pulse 96   Temp 98.4 F (36.9 C) (Oral)   Ht 5' 9.5 (1.765 m)   Wt 286 lb (129.7 kg)   SpO2 97%   BMI 41.63 kg/m    CC: DM f/u visit  Subjective:   HPI: Jorge Boyle is a 48 y.o. male presenting on 07/05/2024 for Medical Management of Chronic Issues (DM follow up) and Cyst (Right side of bottom. Painful to sit or walk x 5 days. Denies any discharge )   R buttock into groin skin lesion painful to sit or walk x5d. No draining, no fevers/chills, nausea. He rides forklift at work.   DM - does not regularly check sugars - doesn't remember. He used CGM sample to check sugars for 1 week. Compliant with antihyperglycemic regimen which includes: metformin  1000mg  BID, amaryl  2mg  daily, mounjaro  5 weekly. Actos  stopped 05/2023 Denies low sugars or hypoglycemic symptoms. Denies paresthesias, blurry vision. Last diabetic eye exam 03/2024. Glucometer brand: contour next. Last foot exam: 62025. DSME: has declined.  Lab Results  Component Value Date   HGBA1C 11.9 (A) 07/05/2024   Diabetic Foot Exam - Simple   No data filed    Lab Results  Component Value Date   MICROALBUR 1.2 11/28/2023         Relevant past medical, surgical, family and social history reviewed and updated as indicated. Interim medical history since our last visit reviewed. Allergies and medications reviewed and updated. Outpatient Medications Prior to Visit  Medication Sig Dispense Refill   atorvastatin  (LIPITOR) 20 MG tablet Take 1 tablet (20 mg total) by mouth daily. 90 tablet 4   Blood Glucose Monitoring Suppl (CONTOUR NEXT ONE) KIT Use as instructed to check blood sugar once a day 1 kit 0   cetirizine  (ZYRTEC ) 10 MG tablet Take 10 mg by mouth daily as needed.     colchicine  0.6 MG tablet TAKE 2 TABLETS BY MOUTH AT ONSET OF GOUT FLARE, THEN  TAKE 1  TABLET BY MOUTH DAILY AS NEEDED  FOR GOUT FLARE 60 tablet 5   cyclobenzaprine  (FLEXERIL ) 10 MG tablet Take 0.5-1 tablets (5-10 mg total) by mouth 2 (two) times daily as needed for muscle spasms (sedation precautions). 30 tablet 1   fluticasone  (FLONASE ) 50 MCG/ACT nasal spray Place 2 sprays into both nostrils daily. 48 g 4   glimepiride  (AMARYL ) 2 MG tablet Take 1 tablet (2 mg total) by mouth daily with breakfast. 90 tablet 2   glucose blood (CONTOUR NEXT TEST) test strip Use as instructed to check blood sugar once daily. 100 each 3   hydrOXYzine  (ATARAX ) 25 MG tablet Take 1 tablet (25 mg total) by mouth 2 (two) times daily as needed for anxiety (sedation precautions). 30 tablet 3   levothyroxine  (SYNTHROID ) 175 MCG tablet Take 1 tablet (175 mcg total) by mouth daily before breakfast. 90 tablet 4   losartan  (COZAAR ) 100 MG tablet Take 1 tablet (100 mg total) by mouth daily. 90 tablet 4   meloxicam  (MOBIC ) 15 MG tablet TAKE 1 TABLET (15 MG TOTAL) BY MOUTH DAILY. 30 tablet 3   metFORMIN  (GLUCOPHAGE ) 1000 MG tablet Take 1 tablet (1,000 mg total) by mouth 2 (two) times daily with a meal. 180 tablet 4   Microlet Lancets MISC Use as directed to check blood sugars  once daily. 100 each 3   tirzepatide  (MOUNJARO ) 2.5 MG/0.5ML Pen Inject 2.5 mg into the skin once a week. 2 mL 0   tirzepatide  (MOUNJARO ) 5 MG/0.5ML Pen Inject 5 mg into the skin once a week. 2 mL 0   tirzepatide  (MOUNJARO ) 7.5 MG/0.5ML Pen Inject 7.5 mg into the skin once a week. 2 mL 3   naproxen  (NAPROSYN ) 500 MG tablet TAKE 1 TABLET BY MOUTH TWICE A DAY FOR 1 WEEK THEN AS NEEDED FOR PAIN TAKE WITH FOOD (Patient not taking: Reported on 07/05/2024) 50 tablet 0   No facility-administered medications prior to visit.     Per HPI unless specifically indicated in ROS section below Review of Systems  Objective:  BP 138/78   Pulse 96   Temp 98.4 F (36.9 C) (Oral)   Ht 5' 9.5 (1.765 m)   Wt 286 lb (129.7 kg)   SpO2 97%   BMI 41.63  kg/m   Wt Readings from Last 3 Encounters:  07/05/24 286 lb (129.7 kg)  04/02/24 287 lb 6 oz (130.4 kg)  12/05/23 285 lb 2 oz (129.3 kg)      Physical Exam Vitals and nursing note reviewed.  Constitutional:      Appearance: Normal appearance. He is not ill-appearing.  Eyes:     Extraocular Movements: Extraocular movements intact.     Conjunctiva/sclera: Conjunctivae normal.     Pupils: Pupils are equal, round, and reactive to light.  Cardiovascular:     Rate and Rhythm: Normal rate and regular rhythm.     Pulses: Normal pulses.     Heart sounds: Normal heart sounds. No murmur heard. Pulmonary:     Effort: Pulmonary effort is normal. No respiratory distress.     Breath sounds: Normal breath sounds. No wheezing, rhonchi or rales.  Musculoskeletal:     Right lower leg: No edema.     Left lower leg: No edema.     Comments: See HPI for foot exam if done  Skin:    General: Skin is warm and dry.     Findings: Abscess present. No rash.         Comments:  Multiple boils to R inner thigh inferior to buttock with surrounding erythema Multiple openings present Induration without fluctuance  Neurological:     Mental Status: He is alert.  Psychiatric:        Mood and Affect: Mood normal.        Behavior: Behavior normal.       Results for orders placed or performed in visit on 07/05/24  POCT glycosylated hemoglobin (Hb A1C)   Collection Time: 07/05/24  7:51 AM  Result Value Ref Range   Hemoglobin A1C 11.9 (A) 4.0 - 5.6 %   HbA1c POC (<> result, manual entry)     HbA1c, POC (prediabetic range)     HbA1c, POC (controlled diabetic range)      Assessment & Plan:   Problem List Items Addressed This Visit     Type 2 diabetes mellitus with diabetic neuropathy, unspecified (HCC) - Primary   Chronic, deterioration noted despite taking 3 drugs - mounjaro , metformin , glimepiride   He is currently on mounjaro  5mg  weekly. Will continue titration to 7.5mg  weekly this month then 10mg   weekly next month.  He has not been checking sugars at home. Will start - provided with sugar log sheet. Reassess at wound f/u in 2 days. Discussed relation of hyperglycemia and increased risk of infections.  New glucometer Rx sent to pharmacy  RTC 3 mo DM f/u visit.       Relevant Medications   tirzepatide  (MOUNJARO ) 10 MG/0.5ML Pen (Start on 08/02/2024)   tirzepatide  (MOUNJARO ) 7.5 MG/0.5ML Pen   Other Relevant Orders   POCT glycosylated hemoglobin (Hb A1C) (Completed)   Carbuncle and furuncle of buttock   Already has several openings to area. No obvious fluctuance. Not confident I&D would be successful so not performed today. Rx doxycycline  course. Rec start warm compresses, warm sitz bath.  Encouraged improved glycemic control.  Wound culture sent. Wound covered with gauze and paper tape RTC 2d wound check.       Relevant Orders   WOUND CULTURE   Other Visit Diagnoses       Encounter for immunization       Relevant Orders   Flu vaccine trivalent PF, 6mos and older(Flulaval,Afluria,Fluarix,Fluzone) (Completed)        Meds ordered this encounter  Medications   tirzepatide  (MOUNJARO ) 10 MG/0.5ML Pen    Sig: Inject 10 mg into the skin once a week.    Dispense:  6 mL    Refill:  1   tirzepatide  (MOUNJARO ) 7.5 MG/0.5ML Pen    Sig: Inject 7.5 mg into the skin once a week.    Dispense:  2 mL    Refill:  0   doxycycline  (VIBRA -TABS) 100 MG tablet    Sig: Take 1 tablet (100 mg total) by mouth 2 (two) times daily.    Dispense:  20 tablet    Refill:  0   Blood Glucose Monitoring Suppl DEVI    Sig: 1 each by Does not apply route in the morning, at noon, and at bedtime. May substitute to any manufacturer covered by patient's insurance.    Dispense:  1 each    Refill:  0   Glucose Blood (BLOOD GLUCOSE TEST STRIPS) STRP    Sig: 1 each by In Vitro route in the morning, at noon, and at bedtime. May substitute to any manufacturer covered by patient's insurance.    Dispense:  100  strip    Refill:  0   Lancet Device MISC    Sig: 1 each by Does not apply route in the morning, at noon, and at bedtime. May substitute to any manufacturer covered by patient's insurance.    Dispense:  1 each    Refill:  0   Lancets Misc. MISC    Sig: 1 each by Does not apply route in the morning, at noon, and at bedtime. May substitute to any manufacturer covered by patient's insurance.    Dispense:  100 each    Refill:  0    Orders Placed This Encounter  Procedures   WOUND CULTURE    Source:   R buttock   Flu vaccine trivalent PF, 6mos and older(Flulaval,Afluria,Fluarix,Fluzone)   POCT glycosylated hemoglobin (Hb A1C)    Patient Instructions  Flu shot today Start tracking sugars and jot down- bring in on Wednesday.  Take Mounjaro  7.5mg  weekly for 1 month then increase to 10mg  weekly - I sent prescriptions for both of these.  Continue metformin  and glimepiride .  Take doxycycline  antibiotic for 10 days. Warm compresses throughout the day, warm sitz baths. Return Wednesday 4:30pm for recheck wound.   Follow up plan: Return in about 2 days (around 07/07/2024), or if symptoms worsen or fail to improve, for follow up visit.  Anton Blas, MD

## 2024-07-05 NOTE — Assessment & Plan Note (Signed)
 Chronic, deterioration noted despite taking 3 drugs - mounjaro , metformin , glimepiride   He is currently on mounjaro  5mg  weekly. Will continue titration to 7.5mg  weekly this month then 10mg  weekly next month.  He has not been checking sugars at home. Will start - provided with sugar log sheet. Reassess at wound f/u in 2 days. Discussed relation of hyperglycemia and increased risk of infections.  New glucometer Rx sent to pharmacy RTC 3 mo DM f/u visit.

## 2024-07-05 NOTE — Patient Instructions (Addendum)
 Flu shot today Start tracking sugars and jot down- bring in on Wednesday.  Take Mounjaro  7.5mg  weekly for 1 month then increase to 10mg  weekly - I sent prescriptions for both of these.  Continue metformin  and glimepiride .  Take doxycycline  antibiotic for 10 days. Warm compresses throughout the day, warm sitz baths. Return Wednesday 4:30pm for recheck wound.

## 2024-07-07 ENCOUNTER — Ambulatory Visit: Admitting: Family Medicine

## 2024-07-07 ENCOUNTER — Encounter: Payer: Self-pay | Admitting: Family Medicine

## 2024-07-07 VITALS — BP 138/84 | HR 91 | Temp 98.6°F | Ht 69.5 in | Wt 286.4 lb

## 2024-07-07 DIAGNOSIS — Z7985 Long-term (current) use of injectable non-insulin antidiabetic drugs: Secondary | ICD-10-CM

## 2024-07-07 DIAGNOSIS — E1140 Type 2 diabetes mellitus with diabetic neuropathy, unspecified: Secondary | ICD-10-CM | POA: Diagnosis not present

## 2024-07-07 DIAGNOSIS — L0233 Carbuncle of buttock: Secondary | ICD-10-CM | POA: Diagnosis not present

## 2024-07-07 DIAGNOSIS — E1165 Type 2 diabetes mellitus with hyperglycemia: Secondary | ICD-10-CM | POA: Diagnosis not present

## 2024-07-07 DIAGNOSIS — Z7984 Long term (current) use of oral hypoglycemic drugs: Secondary | ICD-10-CM

## 2024-07-07 MED ORDER — BASAGLAR KWIKPEN 100 UNIT/ML ~~LOC~~ SOPN: 10.0000 [IU] | PEN_INJECTOR | Freq: Every day | SUBCUTANEOUS | 3 refills | Status: DC

## 2024-07-07 MED ORDER — INSULIN PEN NEEDLE 31G X 5 MM MISC: 0 refills | Status: AC

## 2024-07-07 NOTE — Assessment & Plan Note (Signed)
 Persistence of carbuncle but improvement noted on abx.  Wound culture preliminarily growing strep agalactiae, but also has many gram positive cocci in clusters. Continue doxycycline  antibiotic.  Rec continue warm compresses.  See below for glycemic control.

## 2024-07-07 NOTE — Assessment & Plan Note (Signed)
 Chronic, persistent hyperglycemia to 200-300s despite 3 drug regimen including mounjaro , metformin , glimepiride .  Start long acting insulin  glargine 10u daily sent to pharmacy.  Reviewed titration schedule - to increase by 2 units every 2 days if average fasting sugar >150.  Record and send me sugar readings early next week.

## 2024-07-07 NOTE — Patient Instructions (Addendum)
 Start daily insulin  basaglar  10 units daily. Then increase by 2 units every 2 days if average fasting morning sugar staying >150, to max 30 units daily.   Send me fasting sugar readings next week along with update on how sugars are doing   Finish doxycycline  antibiotic. Continue daily gauze changes. Use warm compresses to area at least twice a day.

## 2024-07-07 NOTE — Progress Notes (Signed)
 Ph: [phone] Fax: [phone]   Patient ID: Jorge Boyle, male    DOB: March 30, 1976, 48 y.o.   MRN: [id number]  This visit was conducted in person.  BP 138/84   Pulse 91   Temp 98.6 F (37 C) (Oral)   Ht 5' 9.5 (1.765 m)   Wt 286 lb 6 oz (129.9 kg)   SpO2 95%   BMI 41.68 kg/m    CC: check wound Subjective:   HPI: Jorge Boyle is a 48 y.o. male presenting on 07/07/2024 for Medical Management of Chronic Issues (Pt here for wound re-check.)   See prior note for details.  He's tolerating doxycycline  well.  Skin infection is less painful.  No fevers/chills, nausea.   Sugars have been very high - 200-300s, 290 fasting this morning.  This is despite amaryl  2mg , metformin  1000mg  bid, mounjaro  7.5mg  weekly (latest mounjaro  dose was Monday).  Lab Results  Component Value Date   HGBA1C 11.9 (A) 07/05/2024       Relevant past medical, surgical, family and social history reviewed and updated as indicated. Interim medical history since our last visit reviewed. Allergies and medications reviewed and updated. Outpatient Medications Prior to Visit  Medication Sig Dispense Refill   atorvastatin  (LIPITOR) 20 MG tablet Take 1 tablet (20 mg total) by mouth daily. 90 tablet 4   Blood Glucose Monitoring Suppl (CONTOUR NEXT ONE) KIT Use as instructed to check blood sugar once a day 1 kit 0   Blood Glucose Monitoring Suppl DEVI 1 each by Does not apply route in the morning, at noon, and at bedtime. May substitute to any manufacturer covered by patient's insurance. 1 each 0   cetirizine  (ZYRTEC ) 10 MG tablet Take 10 mg by mouth daily as needed.     colchicine  0.6 MG tablet TAKE 2 TABLETS BY MOUTH AT ONSET OF GOUT FLARE, THEN TAKE 1  TABLET BY MOUTH DAILY AS NEEDED  FOR GOUT FLARE 60 tablet 5   cyclobenzaprine  (FLEXERIL ) 10 MG tablet Take 0.5-1 tablets (5-10 mg total) by mouth 2 (two) times daily as needed for muscle spasms (sedation precautions). 30 tablet 1   doxycycline   (VIBRA -TABS) 100 MG tablet Take 1 tablet (100 mg total) by mouth 2 (two) times daily. 20 tablet 0   fluticasone  (FLONASE ) 50 MCG/ACT nasal spray Place 2 sprays into both nostrils daily. 48 g 4   glimepiride  (AMARYL ) 2 MG tablet Take 1 tablet (2 mg total) by mouth daily with breakfast. 90 tablet 2   Glucose Blood (BLOOD GLUCOSE TEST STRIPS) STRP 1 each by In Vitro route in the morning, at noon, and at bedtime. May substitute to any manufacturer covered by patient's insurance. 100 strip 0   glucose blood (CONTOUR NEXT TEST) test strip Use as instructed to check blood sugar once daily. 100 each 3   hydrOXYzine  (ATARAX ) 25 MG tablet Take 1 tablet (25 mg total) by mouth 2 (two) times daily as needed for anxiety (sedation precautions). 30 tablet 3   Lancet Device MISC 1 each by Does not apply route in the morning, at noon, and at bedtime. May substitute to any manufacturer covered by patient's insurance. 1 each 0   Lancets Misc. MISC 1 each by Does not apply route in the morning, at noon, and at bedtime. May substitute to any manufacturer covered by patient's insurance. 100 each 0   levothyroxine  (SYNTHROID ) 175 MCG tablet Take 1 tablet (175 mcg total) by mouth daily before breakfast. 90 tablet 4  losartan  (COZAAR ) 100 MG tablet Take 1 tablet (100 mg total) by mouth daily. 90 tablet 4   meloxicam  (MOBIC ) 15 MG tablet TAKE 1 TABLET (15 MG TOTAL) BY MOUTH DAILY. 30 tablet 3   metFORMIN  (GLUCOPHAGE ) 1000 MG tablet Take 1 tablet (1,000 mg total) by mouth 2 (two) times daily with a meal. 180 tablet 4   Microlet Lancets MISC Use as directed to check blood sugars once daily. 100 each 3   [START ON 08/02/2024] tirzepatide  (MOUNJARO ) 10 MG/0.5ML Pen Inject 10 mg into the skin once a week. 6 mL 1   tirzepatide  (MOUNJARO ) 7.5 MG/0.5ML Pen Inject 7.5 mg into the skin once a week. 2 mL 0   No facility-administered medications prior to visit.     Per HPI unless specifically indicated in ROS section below Review of  Systems  Objective:  BP 138/84   Pulse 91   Temp 98.6 F (37 C) (Oral)   Ht 5' 9.5 (1.765 m)   Wt 286 lb 6 oz (129.9 kg)   SpO2 95%   BMI 41.68 kg/m   Wt Readings from Last 3 Encounters:  07/07/24 286 lb 6 oz (129.9 kg)  07/05/24 286 lb (129.7 kg)  04/02/24 287 lb 6 oz (130.4 kg)      Physical Exam Vitals and nursing note reviewed.  Constitutional:      Appearance: Normal appearance. He is not ill-appearing.  Skin:    General: Skin is warm and dry.     Findings: Abscess present.         Comments: Coalescing carbuncle to R upper inner thigh inferior to buttock  Improving induration, no fluctuance.   Neurological:     Mental Status: He is alert.  Psychiatric:        Mood and Affect: Mood normal.        Behavior: Behavior normal.       Results for orders placed or performed in visit on 07/05/24  POCT glycosylated hemoglobin (Hb A1C)   Collection Time: 07/05/24  7:51 AM  Result Value Ref Range   Hemoglobin A1C 11.9 (A) 4.0 - 5.6 %   HbA1c POC (<> result, manual entry)     HbA1c, POC (prediabetic range)     HbA1c, POC (controlled diabetic range)    WOUND CULTURE   Collection Time: 07/05/24  8:17 AM   Specimen: Wound  Result Value Ref Range   MICRO NUMBER: [id number]    SPECIMEN QUALITY: Adequate    SOURCE: WOUND    STATUS: PRELIMINARY    GRAM STAIN:      No white blood cells seen Few epithelial cells Many Gram positive cocci in clusters   ISOLATE 1: Streptococcus agalactiae (A)     Assessment & Plan:   Problem List Items Addressed This Visit     Type 2 diabetes mellitus with diabetic neuropathy, unspecified (HCC)   Chronic, persistent hyperglycemia to 200-300s despite 3 drug regimen including mounjaro , metformin , glimepiride .  Start long acting insulin  glargine 10u daily sent to pharmacy.  Reviewed titration schedule - to increase by 2 units every 2 days if average fasting sugar >150.  Record and send me sugar readings early next week.       Relevant  Medications   Insulin  Glargine (BASAGLAR  KWIKPEN) 100 UNIT/ML   Carbuncle and furuncle of buttock - Primary   Persistence of carbuncle but improvement noted on abx.  Wound culture preliminarily growing strep agalactiae, but also has many gram positive cocci in clusters. Continue doxycycline   antibiotic.  Rec continue warm compresses.  See below for glycemic control.       Other Visit Diagnoses       Type 2 diabetes mellitus with hyperglycemia, without long-term current use of insulin  (HCC)       Relevant Medications   Insulin  Glargine (BASAGLAR  KWIKPEN) 100 UNIT/ML        Meds ordered this encounter  Medications   Insulin  Glargine (BASAGLAR  KWIKPEN) 100 UNIT/ML    Sig: Inject 10 Units into the skin daily.    Dispense:  3 mL    Refill:  3   Insulin  Pen Needle 31G X 5 MM MISC    Sig: Use to inject insulin  daily    Dispense:  100 each    Refill:  0    No orders of the defined types were placed in this encounter.   Patient Instructions  Start daily insulin  basaglar  10 units daily. Then increase by 2 units every 2 days if average fasting morning sugar staying >150, to max 30 units daily.   Send me fasting sugar readings next week along with update on how sugars are doing   Finish doxycycline  antibiotic. Continue daily gauze changes. Use warm compresses to area at least twice a day.   Follow up plan: No follow-ups on file.  Anton Blas, MD

## 2024-07-08 ENCOUNTER — Ambulatory Visit: Payer: Self-pay | Admitting: Family Medicine

## 2024-07-08 LAB — WOUND CULTURE
MICRO NUMBER:: 16967959
SPECIMEN QUALITY:: ADEQUATE

## 2024-07-08 MED ORDER — CEPHALEXIN 500 MG PO CAPS: 500.0000 mg | ORAL_CAPSULE | Freq: Four times a day (QID) | ORAL | 0 refills | Status: AC

## 2024-07-08 NOTE — Telephone Encounter (Signed)
 Called patient reviewed all information and repeated back to me. Will call if any questions. Will pick up Keflx today and reach out if any symptoms.  He will call if any changes.

## 2024-07-19 ENCOUNTER — Other Ambulatory Visit: Payer: Self-pay | Admitting: Podiatry

## 2024-08-04 DIAGNOSIS — M722 Plantar fascial fibromatosis: Secondary | ICD-10-CM | POA: Diagnosis not present

## 2024-10-26 ENCOUNTER — Other Ambulatory Visit: Payer: Self-pay | Admitting: Family Medicine

## 2024-10-26 ENCOUNTER — Telehealth: Payer: Self-pay | Admitting: Family Medicine

## 2024-10-26 MED ORDER — LANTUS SOLOSTAR 100 UNIT/ML ~~LOC~~ SOPN: 20.0000 [IU] | PEN_INJECTOR | Freq: Every day | SUBCUTANEOUS | 3 refills | Status: AC

## 2024-10-26 NOTE — Telephone Encounter (Signed)
 Spoke with mother at appt today He has been using basaglar  20 units daily. Lantus  is better covered by insurance  - he requests new Rx for lantus  which I have sent to his pharmacy

## 2024-10-29 NOTE — Telephone Encounter (Signed)
 Plz clarify current dose of mounjaro  - is he on 7.5mg  or 10mg  wekely now?  Last sent in 10mg  weekly but received refill request for 7.5mg  dose

## 2024-11-01 NOTE — Telephone Encounter (Signed)
 Called patient he has never received the 10mg  has been on the 7.5. he is happy to move up but has never received.

## 2024-11-03 NOTE — Telephone Encounter (Signed)
 ERx
